# Patient Record
Sex: Female | Born: 1970 | Race: White | Hispanic: No | Marital: Married | Smoking: Never smoker
Health system: Southern US, Community
[De-identification: ages and names within clinical notes are randomized; demographics above are authoritative.]

## PROBLEM LIST (undated history)

## (undated) DIAGNOSIS — R0602 Shortness of breath: Secondary | ICD-10-CM

## (undated) DIAGNOSIS — D6959 Other secondary thrombocytopenia: Secondary | ICD-10-CM

## (undated) DIAGNOSIS — R5383 Other fatigue: Secondary | ICD-10-CM

## (undated) DIAGNOSIS — C50919 Malignant neoplasm of unspecified site of unspecified female breast: Secondary | ICD-10-CM

## (undated) DIAGNOSIS — T451X5A Adverse effect of antineoplastic and immunosuppressive drugs, initial encounter: Secondary | ICD-10-CM

## (undated) DIAGNOSIS — J95811 Postprocedural pneumothorax: Secondary | ICD-10-CM

## (undated) DIAGNOSIS — Z1501 Genetic susceptibility to malignant neoplasm of breast: Secondary | ICD-10-CM

## (undated) DIAGNOSIS — Z9289 Personal history of other medical treatment: Secondary | ICD-10-CM

## (undated) DIAGNOSIS — Z1509 Genetic susceptibility to other malignant neoplasm: Secondary | ICD-10-CM

## (undated) DIAGNOSIS — Z95828 Presence of other vascular implants and grafts: Secondary | ICD-10-CM

## (undated) DIAGNOSIS — K219 Gastro-esophageal reflux disease without esophagitis: Secondary | ICD-10-CM

## (undated) HISTORY — DX: Gastro-esophageal reflux disease without esophagitis: K21.9

## (undated) HISTORY — DX: Other fatigue: R53.83

## (undated) HISTORY — DX: Other secondary thrombocytopenia: D69.59

## (undated) HISTORY — DX: Genetic susceptibility to malignant neoplasm of breast: Z15.01

## (undated) HISTORY — PX: OTHER SURGICAL HISTORY: SHX169

## (undated) HISTORY — DX: Adverse effect of antineoplastic and immunosuppressive drugs, initial encounter: T45.1X5A

## (undated) HISTORY — DX: Malignant neoplasm of unspecified site of unspecified female breast: C50.919

## (undated) HISTORY — DX: Genetic susceptibility to other malignant neoplasm: Z15.09

---

## 1998-03-04 ENCOUNTER — Other Ambulatory Visit: Admission: RE | Admit: 1998-03-04 | Discharge: 1998-03-04 | Payer: Self-pay | Admitting: Gynecology

## 1999-07-24 ENCOUNTER — Inpatient Hospital Stay (HOSPITAL_COMMUNITY): Admission: AD | Admit: 1999-07-24 | Discharge: 1999-07-26 | Payer: Self-pay | Admitting: Obstetrics and Gynecology

## 1999-07-29 ENCOUNTER — Encounter (HOSPITAL_COMMUNITY): Admission: RE | Admit: 1999-07-29 | Discharge: 1999-08-16 | Payer: Self-pay | Admitting: Obstetrics and Gynecology

## 1999-08-23 ENCOUNTER — Other Ambulatory Visit: Admission: RE | Admit: 1999-08-23 | Discharge: 1999-08-23 | Payer: Self-pay | Admitting: Obstetrics and Gynecology

## 1999-10-20 ENCOUNTER — Encounter: Admission: RE | Admit: 1999-10-20 | Discharge: 2000-01-18 | Payer: Self-pay | Admitting: Obstetrics and Gynecology

## 2000-04-17 HISTORY — PX: DILATION AND CURETTAGE OF UTERUS: SHX78

## 2000-08-22 ENCOUNTER — Other Ambulatory Visit: Admission: RE | Admit: 2000-08-22 | Discharge: 2000-08-22 | Payer: Self-pay | Admitting: Obstetrics and Gynecology

## 2000-12-26 ENCOUNTER — Ambulatory Visit (HOSPITAL_COMMUNITY): Admission: AD | Admit: 2000-12-26 | Discharge: 2000-12-26 | Payer: Self-pay | Admitting: Obstetrics and Gynecology

## 2000-12-26 ENCOUNTER — Encounter (INDEPENDENT_AMBULATORY_CARE_PROVIDER_SITE_OTHER): Payer: Self-pay | Admitting: *Deleted

## 2002-01-03 ENCOUNTER — Inpatient Hospital Stay (HOSPITAL_COMMUNITY): Admission: AD | Admit: 2002-01-03 | Discharge: 2002-01-06 | Payer: Self-pay | Admitting: Obstetrics & Gynecology

## 2002-01-31 ENCOUNTER — Other Ambulatory Visit: Admission: RE | Admit: 2002-01-31 | Discharge: 2002-01-31 | Payer: Self-pay | Admitting: Obstetrics and Gynecology

## 2002-02-14 ENCOUNTER — Ambulatory Visit (HOSPITAL_COMMUNITY): Admission: RE | Admit: 2002-02-14 | Discharge: 2002-02-14 | Payer: Self-pay | Admitting: Obstetrics and Gynecology

## 2002-07-23 ENCOUNTER — Other Ambulatory Visit: Admission: RE | Admit: 2002-07-23 | Discharge: 2002-07-23 | Payer: Self-pay | Admitting: Obstetrics and Gynecology

## 2003-02-02 ENCOUNTER — Other Ambulatory Visit: Admission: RE | Admit: 2003-02-02 | Discharge: 2003-02-02 | Payer: Self-pay | Admitting: Obstetrics and Gynecology

## 2006-08-07 ENCOUNTER — Inpatient Hospital Stay (HOSPITAL_COMMUNITY): Admission: RE | Admit: 2006-08-07 | Discharge: 2006-08-09 | Payer: Self-pay | Admitting: Obstetrics & Gynecology

## 2009-05-26 ENCOUNTER — Encounter: Admission: RE | Admit: 2009-05-26 | Discharge: 2009-05-26 | Payer: Self-pay | Admitting: Obstetrics and Gynecology

## 2010-06-30 ENCOUNTER — Other Ambulatory Visit: Payer: Self-pay | Admitting: Obstetrics and Gynecology

## 2010-06-30 DIAGNOSIS — R928 Other abnormal and inconclusive findings on diagnostic imaging of breast: Secondary | ICD-10-CM

## 2010-07-06 ENCOUNTER — Ambulatory Visit
Admission: RE | Admit: 2010-07-06 | Discharge: 2010-07-06 | Disposition: A | Discharge status: Home / Self Care | Payer: 59 | Source: Ambulatory Visit | Attending: Obstetrics and Gynecology | Admitting: Obstetrics and Gynecology

## 2010-07-06 DIAGNOSIS — R928 Other abnormal and inconclusive findings on diagnostic imaging of breast: Secondary | ICD-10-CM

## 2010-09-02 NOTE — Op Note (Signed)
[facility]  Patient:    Stacey Cross, Stacey Cross Visit Number: [id number] MRN: [id number]          Service Type: OBS Location: [facility] Attending Physician:  Miguel Aschoff Dictated by:   Miguel Aschoff, M.D. Proc. Date: 12/26/00 Admit Date:  12/26/2000                             Operative Report  PREOPERATIVE DIAGNOSES:       Incomplete abortion.  POSTOPERATIVE DIAGNOSES:      Incomplete abortion.  PROCEDURE:                    Suction curettage.  SURGEON:                      Miguel Aschoff, M.D.  ANESTHESIA:                   IV sedation with paracervical block.  COMPLICATIONS:                None.  JUSTIFICATION:                The patient is a 40 year old white female gravida 2, para 1-0-0-1 with last menstrual period of October 18, 2000.  The patient had an ultrasound done last week which revealed what appeared to be an anembrionic sac.  Serial hCG values were obtained and revealed a plateauing of the values.  Patient did elect to wait one additional week before making any decisions about evacuation of the uterus, however, presented with the onset of very heavy vaginal bleeding today with cervical os open and POCs presenting. She is now being taken to the operating room to undergo suction curettage to evacuate the uterus.  PROCEDURE:                    Patient was taken to the operating room, placed in the supine position.  IV sedation was administered without difficulty.  She was then placed in the dorsal lithotomy position and prepped and draped in the usual sterile fashion.  Cervix was noted to be open and readily passed the #9 vacuum curette.  Suction was applied and the contents of the uterus were evacuated without difficulty.  Following the curettage sharp curettage was carried out with only a small amount of additional tissue being obtained. Then a final pass was made with a suction curette and no other tissue was found.  The procedure was then  completed.  It should be noted that prior to starting the procedure 18 cc of 1% Xylocaine were injected in the cervix at the 12, 4, and 8 oclock positions.  Patient tolerated the procedure well. The estimated total blood loss was approximately 80 cc.  Patient is noted to be Rh positive.  Plan is for the patient to be discharged home.  DISCHARGE MEDICATIONS:        1. Doxycycline 100 mg b.i.d. x 3 days.                               2. Darvocet-N 100 p.r.n. for cramping.  DISCHARGE INSTRUCTIONS:       She is directed to place nothing in the vagina for two weeks.  She will be seen back for followup in four weeks.  She is to call for any problems such as fever,  pain, or heavy bleeding. Dictated by:   Miguel Aschoff, M.D. Attending Physician:  Miguel Aschoff DD:  12/26/00 TD:  12/26/00 Job: [id number]

## 2010-09-02 NOTE — Op Note (Signed)
NAME:  Stacey Cross, Stacey Cross                        ACCOUNT NO.:  [id number]   MEDICAL RECORD NO.:  [id number]                   PATIENT TYPE:  INP   LOCATION:  9127                                 FACILITY:  [facility]   PHYSICIAN:  Randye Lobo, M.D.                DATE OF BIRTH:  06-04-1970   DATE OF PROCEDURE:  01/03/2002  DATE OF DISCHARGE:                                 OPERATIVE REPORT   PREOPERATIVE DIAGNOSES:  1. Intrauterine gestation at 37+1 weeks.  2. Breech presentation.  3. Early labor.   POSTOPERATIVE DIAGNOSES:  1. Intrauterine gestation at 37+1 weeks.  2. Breech presentation.  3. Early labor.   PROCEDURE:  Primary low segment transverse cesarean section.   SURGEON:  Randye Lobo, M.D.   ANESTHESIA:  Spinal.   IV FLUIDS:  3000 Ringer's lactate.   ESTIMATED BLOOD LOSS:  600 cc.   URINE OUTPUT:  250 cc clear yellow urine.   COMPLICATIONS:  None.   INDICATIONS FOR PROCEDURE:  The patient was a 40 year old gravida 3, para 1-  0-0-1 Caucasian female at 37+[redacted] weeks gestation ([facility] January 22, 2002 by last  menstrual period) who presented to the triage area at the [facility]  stating contractions q.10 minutes with onset late that afternoon.  The  patient reported also bloody red vaginal discharge, although she denied any  leakage of fluid per vagina.  The patient reported good fetal movement.  In  the office on January 02, 2002 the patient was noted to be 3 cm dilated  with 50% effacement and a breech presentation which was confirmed by  ultrasound.  After reviewing alternatives with the patient, she was  scheduled for an external cephalic version procedure on January 09, 2002.  Upon arrival to the [facility] on January 03, 2002 the patient's  cervix was noted to be 3+ cm dilated and 70% effaced with the breech at the  -2 station.  Membranes were intact and there was tension of the membranes  appreciated with a contraction.  A bed side ultrasound was  performed which  confirmed a complete breech presentation.  The fetal heart rate tracing was  noted to be reassuring and contractions were documented every three minutes.  The patient was diagnosed with early labor and a discussion was held with  her regarding her alternatives and a plan was made to proceed with a primary  low segment transverse cesarean section after the risks and benefits were  reviewed.   FINDINGS:  A complete breech presentation was confirmed.  The amniotic fluid  was noted to be clear.  A viable female was delivered at 1856 with Apgars of  8 at one minute and 9 at five minutes.  The weight was later noted to be 6  pounds and 8 ounces.  A cord pH measured 7.33.  The placenta had a normal  configuration and insertion of a three vessel  cord.  The uterus, tubes, and  ovaries were all normal.   SPECIMENS:  None.   PROCEDURE:  With an IV in place, the patient was taken from the triage area  directly down to the operating room suite after she received her IV and oral  Bicitra.  The patient did receive a spinal anesthetic and was then placed in  the left lateral decubitus position with a hip bolster underneath.  The  abdomen was sterilely prepped and a Foley catheter was sterilely placed  inside the bladder.  The procedure began with a Pfannenstiel incision which  was created sharply with a scalpel.  The incision was carried down to the  fascia using monopolar cautery.  Small bleeding vessels were cauterized  using the same.  The fascia was then scored in the midline with a scalpel  and the incision was carried up bilaterally using a Mayo scissors.  The  rectus muscles were dissected off of the overlying fascia using the Mayo  scissors.  The rectus muscles were then divided in the midline using a  combination of blunt and sharp dissection with Mayo scissors.  The  parietoperitoneum was identified and grasped with two clamp clamps.  It was  entered with a combination of  sharp dissection with the Metzenbaum scissors  and blunt dissection.  The incision was then extended cranially and caudally  with care taken to avoid enterotomy and cystotomy.   A bladder retractor was placed over the bladder and the position of the  fetus was confirmed.  A transverse lower uterine segment incision was  created sharply with a scalpel.  This incision was created high in the lower  uterine segment.  The uterine incision was extended with a combination of  blunt dissection and sharp dissection with a bandage scissors.  Membranes  were ruptured and a hand was inserted through the uterine incision and the  breech was delivered without difficulty followed by the right and the left  legs.  Each of the arms were delivered and then the head was delivered  without difficulty.  The nares and mouth were suctioned and the cord was  doubly clamped and cut.  The newborn was carried over to the waiting  pediatricians in a vigorous state.  The patient did receive Cefotetan 2 g  intravenously and cord blood and a cord pH were obtained at this time.  The  placenta was then manually extracted.  A moistened lap pad was used to wipe  clean the uterine cavity.  There were no remaining products of conception  and the cavity was noted to have a normal configuration.   The uterine incision was next closed with a running locked suture of number  1 chromic after the bladder had been dissected down from the lower uterine  segment.  There was some bleeding noted along the right apex of the incision  which responded well to a figure-of-eight suture of number 1 chromic.  Bleeding along the peritoneal edges of the vesicouterine fold then responded  well to monopolar cautery after it was noted to ooze slightly.   The uterus which had been exteriorized for its closure was returned to the peritoneal cavity which was irrigated and suctioned.  The incision was once  again reexamined and thought to be  hemostatic.   The abdomen was closed at this time, first using a running suture of number  3 plain along the peritoneum.  The rectus muscles were brought together in  the midline with  interrupted sutures of number 1 chromic.  The fascia was  closed with a running suture of 0 Vicryl and the subcutaneous tissue was  next irrigated and closed with interrupted sutures of 3-0 plain.  Staples  were placed over the skin followed by a sterile bandage.   The uterus was expressed of remaining clots and the Betadine was cleansed of  her skin.  The patient was then taken to the recovery room in stable and  awake condition.  There were no complications.  All needle, instrument, and  sponge counts were correct.                                               Randye Lobo, M.D.   BES/MEDQ  D:  01/03/2002  T:  01/06/2002  Job:  [id number]

## 2010-09-02 NOTE — Discharge Summary (Signed)
   NAME:  Stacey Cross, Stacey Cross                        ACCOUNT NO.:  [id number]   MEDICAL RECORD NO.:  [id number]                   PATIENT TYPE:  INP   LOCATION:  9127                                 FACILITY:  [facility]   PHYSICIAN:  Ilda Mori, M.D.                DATE OF BIRTH:  11/15/1970   DATE OF ADMISSION:  01/03/2002  DATE OF DISCHARGE:  01/06/2002                                 DISCHARGE SUMMARY   FINAL DIAGNOSES:  1. Intrauterine pregnancy at 13 and 1/[redacted] weeks gestation.  2. Breech presentation.  3. Early labor.   PROCEDURE:  Primary low segment transverse cesarean section.   SURGEON:  Randye Lobo, M.D.   COMPLICATIONS:  None.   HISTORY:  This 40 year old G3, P1-0-0-1 presents at 68 and 1/[redacted] weeks  gestation stating contractions every 10 minutes apart.  The patient also did  have a red vaginal discharge, although she denied any leakage of fluid.  There was good fetal movement.  Patient at the time of examination in triage  was noted to be 3 cm dilated, 50% effaced, and a breech presentation which  was confirmed by ultrasound.  After reviewing the alternatives and noting  that patient's contractions were every three minutes on the monitor, a  diagnosis of early labor and breech presentation was given and decision was  made to proceed with a primary low segment transverse cesarean section.  The  patient was taken to the operating room by Dr. Conley Simmonds where a primary  low transverse cesarean section was performed with the delivery of a 6 pound  8 ounce female infant with Apgars of 8 and 9.  Delivery went without  complications.  The patient's postoperative course was benign without  significant fevers.  The patient was felt ready for discharge on  postoperative day number three.  Was sent home on a regular diet.  Decrease  activity.  Told to continue prenatal vitamins.  Was given a prescription for  Tylox number 15 one q.4h. as needed for pain and told to follow up in the  office in four weeks.     Leilani Able, PA-C                   Ilda Mori, M.D.    MB/MEDQ  D:  02/10/2002  T:  02/11/2002  Job:  [id number]

## 2011-05-19 HISTORY — PX: BREAST BIOPSY: SHX20

## 2011-06-02 ENCOUNTER — Other Ambulatory Visit: Payer: Self-pay | Admitting: Obstetrics and Gynecology

## 2011-06-02 DIAGNOSIS — N63 Unspecified lump in unspecified breast: Secondary | ICD-10-CM

## 2011-06-05 ENCOUNTER — Ambulatory Visit
Admission: RE | Admit: 2011-06-05 | Discharge: 2011-06-05 | Disposition: A | Discharge status: Home / Self Care | Payer: BC Managed Care – PPO | Source: Ambulatory Visit | Attending: Obstetrics and Gynecology | Admitting: Obstetrics and Gynecology

## 2011-06-05 ENCOUNTER — Other Ambulatory Visit: Payer: Self-pay | Admitting: Obstetrics and Gynecology

## 2011-06-05 DIAGNOSIS — N63 Unspecified lump in unspecified breast: Secondary | ICD-10-CM

## 2011-06-05 DIAGNOSIS — N6320 Unspecified lump in the left breast, unspecified quadrant: Secondary | ICD-10-CM

## 2011-06-08 ENCOUNTER — Other Ambulatory Visit: Payer: 59

## 2011-06-09 ENCOUNTER — Other Ambulatory Visit: Payer: Self-pay | Admitting: Obstetrics and Gynecology

## 2011-06-09 ENCOUNTER — Ambulatory Visit
Admission: RE | Admit: 2011-06-09 | Discharge: 2011-06-09 | Disposition: A | Discharge status: Home / Self Care | Payer: BC Managed Care – PPO | Source: Ambulatory Visit | Attending: Obstetrics and Gynecology | Admitting: Obstetrics and Gynecology

## 2011-06-09 DIAGNOSIS — N6320 Unspecified lump in the left breast, unspecified quadrant: Secondary | ICD-10-CM

## 2011-06-12 ENCOUNTER — Ambulatory Visit
Admission: RE | Admit: 2011-06-12 | Discharge: 2011-06-12 | Disposition: A | Discharge status: Home / Self Care | Payer: BC Managed Care – PPO | Source: Ambulatory Visit | Attending: Obstetrics and Gynecology | Admitting: Obstetrics and Gynecology

## 2011-06-12 ENCOUNTER — Other Ambulatory Visit: Payer: Self-pay | Admitting: Obstetrics and Gynecology

## 2011-06-12 DIAGNOSIS — N6489 Other specified disorders of breast: Secondary | ICD-10-CM

## 2011-06-12 DIAGNOSIS — C50912 Malignant neoplasm of unspecified site of left female breast: Secondary | ICD-10-CM

## 2011-06-12 DIAGNOSIS — N6320 Unspecified lump in the left breast, unspecified quadrant: Secondary | ICD-10-CM

## 2011-06-13 ENCOUNTER — Other Ambulatory Visit: Payer: Self-pay | Admitting: *Deleted

## 2011-06-13 ENCOUNTER — Telehealth: Payer: Self-pay | Admitting: *Deleted

## 2011-06-13 DIAGNOSIS — C50419 Malignant neoplasm of upper-outer quadrant of unspecified female breast: Secondary | ICD-10-CM | POA: Insufficient documentation

## 2011-06-13 NOTE — Telephone Encounter (Signed)
Confirmed [facility] for 06/21/11 at 0800 .  Instructions and contact information given.

## 2011-06-14 ENCOUNTER — Ambulatory Visit
Admission: RE | Admit: 2011-06-14 | Discharge: 2011-06-14 | Disposition: A | Discharge status: Home / Self Care | Payer: BC Managed Care – PPO | Source: Ambulatory Visit | Attending: Obstetrics and Gynecology | Admitting: Obstetrics and Gynecology

## 2011-06-14 DIAGNOSIS — C50912 Malignant neoplasm of unspecified site of left female breast: Secondary | ICD-10-CM

## 2011-06-14 MED ORDER — GADOBENATE DIMEGLUMINE 529 MG/ML IV SOLN
13.0000 mL | Freq: Once | INTRAVENOUS | Status: AC | PRN
  Administered 2011-06-14: 13 mL via INTRAVENOUS

## 2011-06-16 ENCOUNTER — Other Ambulatory Visit: Payer: BC Managed Care – PPO

## 2011-06-16 DIAGNOSIS — J95811 Postprocedural pneumothorax: Secondary | ICD-10-CM

## 2011-06-16 DIAGNOSIS — R0602 Shortness of breath: Secondary | ICD-10-CM

## 2011-06-16 HISTORY — DX: Shortness of breath: R06.02

## 2011-06-16 HISTORY — DX: Postprocedural pneumothorax: J95.811

## 2011-06-20 ENCOUNTER — Other Ambulatory Visit: Payer: Self-pay | Admitting: Obstetrics and Gynecology

## 2011-06-21 ENCOUNTER — Ambulatory Visit (HOSPITAL_BASED_OUTPATIENT_CLINIC_OR_DEPARTMENT_OTHER): Payer: BC Managed Care – PPO | Admitting: Surgery

## 2011-06-21 ENCOUNTER — Encounter: Payer: Self-pay | Admitting: Oncology

## 2011-06-21 ENCOUNTER — Ambulatory Visit (HOSPITAL_BASED_OUTPATIENT_CLINIC_OR_DEPARTMENT_OTHER): Payer: BC Managed Care – PPO | Admitting: Lab

## 2011-06-21 ENCOUNTER — Ambulatory Visit: Payer: BC Managed Care – PPO | Admitting: Oncology

## 2011-06-21 ENCOUNTER — Encounter (INDEPENDENT_AMBULATORY_CARE_PROVIDER_SITE_OTHER): Payer: Self-pay | Admitting: Surgery

## 2011-06-21 ENCOUNTER — Ambulatory Visit: Payer: BC Managed Care – PPO

## 2011-06-21 ENCOUNTER — Ambulatory Visit: Payer: BC Managed Care – PPO | Attending: Surgery | Admitting: Physical Therapy

## 2011-06-21 ENCOUNTER — Encounter: Payer: Self-pay | Admitting: Specialist

## 2011-06-21 ENCOUNTER — Telehealth: Payer: Self-pay | Admitting: Oncology

## 2011-06-21 ENCOUNTER — Other Ambulatory Visit (HOSPITAL_BASED_OUTPATIENT_CLINIC_OR_DEPARTMENT_OTHER): Payer: BC Managed Care – PPO

## 2011-06-21 ENCOUNTER — Ambulatory Visit
Admission: RE | Admit: 2011-06-21 | Discharge: 2011-06-21 | Disposition: A | Discharge status: Home / Self Care | Payer: BC Managed Care – PPO | Source: Ambulatory Visit | Attending: Radiation Oncology | Admitting: Radiation Oncology

## 2011-06-21 VITALS — BP 154/98 | HR 71 | Temp 97.9°F | Wt 143.0 lb

## 2011-06-21 VITALS — BP 154/98 | HR 71 | Temp 97.9°F | Ht 65.5 in | Wt 143.2 lb

## 2011-06-21 DIAGNOSIS — C50419 Malignant neoplasm of upper-outer quadrant of unspecified female breast: Secondary | ICD-10-CM

## 2011-06-21 DIAGNOSIS — C50919 Malignant neoplasm of unspecified site of unspecified female breast: Secondary | ICD-10-CM | POA: Insufficient documentation

## 2011-06-21 DIAGNOSIS — M542 Cervicalgia: Secondary | ICD-10-CM | POA: Insufficient documentation

## 2011-06-21 DIAGNOSIS — IMO0001 Reserved for inherently not codable concepts without codable children: Secondary | ICD-10-CM | POA: Insufficient documentation

## 2011-06-21 LAB — CBC WITH DIFFERENTIAL/PLATELET
EOS%: 0.7 % (ref 0.0–7.0)
HGB: 15 g/dL (ref 11.6–15.9)
LYMPH%: 25.5 % (ref 14.0–49.7)
MCH: 33.7 pg (ref 25.1–34.0)
MCHC: 34.8 g/dL (ref 31.5–36.0)
MCV: 96.8 fL (ref 79.5–101.0)
RBC: 4.45 10*6/uL (ref 3.70–5.45)
RDW: 12.1 % (ref 11.2–14.5)
lymph#: 1.4 10*3/uL (ref 0.9–3.3)

## 2011-06-21 LAB — CANCER ANTIGEN 27.29: CA 27.29: 23 U/mL (ref 0–39)

## 2011-06-21 LAB — COMPREHENSIVE METABOLIC PANEL
AST: 20 U/L (ref 0–37)
Albumin: 4 g/dL (ref 3.5–5.2)
CO2: 26 mEq/L (ref 19–32)
Chloride: 101 mEq/L (ref 96–112)
Creatinine, Ser: 0.67 mg/dL (ref 0.50–1.10)
Potassium: 3.7 mEq/L (ref 3.5–5.3)
Total Bilirubin: 0.4 mg/dL (ref 0.3–1.2)

## 2011-06-21 NOTE — Telephone Encounter (Signed)
gv pt appt schedule for march thru June including pet/ct and echo.  Pt aware i will contact her re f/u appts for 3/21 and 4/4. No availability will check w/KK.

## 2011-06-21 NOTE — Progress Notes (Signed)
Dr. Welton Flakes requested a consultation for genetic counseling and risk assessment for Stacey Cross, a 41 y.o. female, for discussion of her personal history of breast cancer. She presents to clinic today to discuss the possibility of a genetic predisposition to cancer, and to further clarify her risks, as well as her family members' risks for cancer.   HISTORY OF PRESENT ILLNESS: In February, 2013, at the age of 74, Stacey Cross was diagnosed with invasive ductal carcinoma of the left breast. This will be treated with chemotherapy.    Past Medical History  Diagnosis Date  . Breast cancer     Past Surgical History  Procedure Date  . Cesarean section 2003  . Dilation and curettage of uterus 2002    History  Substance Use Topics  . Smoking status: Never Smoker   . Smokeless tobacco: Never Used  . Alcohol Use: 2.4 oz/week    4 Glasses of wine per week    REPRODUCTIVE HISTORY AND PERSONAL RISK ASSESSMENT FACTORS: Menarche was at age 1.   Premenopausal Uterus Intact: Yes  Ovaries Intact: Yes G3P3A0 , first live birth at age 34  She has not previously undergone treatment for infertility.   OCP Use: ~20 years   She has not used HRT in the past.   Triple negative breast cancer.  FAMILY HISTORY:  We obtained a detailed, 4-generation family history.  Significant diagnoses are listed below: Family History  Problem Relation Age of Onset  . Cancer Father   Stacey Cross was diagnosed with left IDC at age 69 years.  Her father died of a heart attack at age 44.  He had two sisters and three brothers.  One brother was diagnosed with prostate cancer and died in his 61s.  One paternal cousin was diagnosed with breast cancer at age 70.  Stacey Cross paternal grandmother died of "torso" cancer.  Stacey Cross maternal family history is reportedly negative for any type of cancer.  Patient's maternal and paternal ancestors are of [place] descent. There is no reported Ashkenazi Jewish ancestry.  There is no  known consanguinity.  GENETIC COUNSELING RISK ASSESSMENT, DISCUSSION, AND SUGGESTED FOLLOW UP: We reviewed the natural history and genetic etiology of sporadic, familial and hereditary cancer syndromes.  We discussed that approximately 5-10% of breast cancer is hereditary.  Of these, approximately 85% are a result of a mutation in the BRCA1 or BRCA2 genes.  [facility] performs testing on approximately 14 genes implicated in hereditary breast cancer.  We discussed the opportunity for applying for financial assistance for genetic testing through [email or web address].  The patient's personal history is suggestive of the following possible diagnosis: hereditary breast and ovarian cancer  We discussed that identification of a hereditary cancer syndrome may help her care providers tailor the patients medical management. If a BRCA1 or BRCA2 gene mutation is detected in this case, the [employer] recommendations would include bilateral mastectomies and oopherectomy. If a mutation is detected, the patient will be referred back to the referring provider and to any additional appropriate care providers to discuss the relevant options.   If a mutation is not found in the patient, this will decrease the likelihood of BRCA1 or BRCA2 mutations as the explanation for her breast cancer. At that time we will discuss the [facility] gene panel.  Cancer surveillance options would be discussed for the patient according to the appropriate standard National Comprehensive Cancer Network and American Cancer Society guidelines, with consideration of their personal and family  history risk factors. In this case, the patient will be referred back to their care providers for discussions of management.   After considering the risks, benefits, and limitations, the patient provided informed consent for  the following  testing:  BRCAnalysis through [employer].   Per the patient's request,  we will contact her by telephone to discuss these results. A follow up genetic counseling visit will be scheduled if indicated.  The patient was seen for a total of 30 minutes, greater than 50% of which was spent face-to-face counseling.  This plan is being carried out per Dr. Milta Deiters recommendations.  This note will also be sent to the referring provider via the electronic medical record. The patient will be supplied with a summary of this genetic counseling discussion as well as educational information on the discussed hereditary cancer syndromes following the conclusion of their visit.   Patient was discussed with Dr. Drue Second.  EPIC CC: Dr. Jamey Ripa Dr. Dayton Scrape   EDUCATIONAL INFORMATION SUPPLIED TO PATIENT AT ENCOUNTER:  Hereditary Breast and Ovarian Cancer Brochure, Application information for [email or web address],    _______________________________________________________________________ For Office Staff:  Number of people involved in session: 4 Was an Intern/ student involved with case: not applicable

## 2011-06-21 NOTE — Progress Notes (Signed)
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 female who is seen today at the [facility] for evaluation of her clinical stage IIA (T2, N0, M0) invasive ductal carcinoma of left breast. She recently noted a left breast mass, and Dr. Malva Limes scheduled her for mammography and ultrasonography on 06/05/2011. On physical examination she was felt to have a 3 cm mass in the upper outer quadrant of the left breast at 1:00 8-9 cm from the nipple. Ultrasound this measured 2.1 x 1.1 x 2.1 cm. There was no axillary adenopathy. A needle core biopsy on 08/07/2011 was diagnostic for invasive ductal carcinoma and DCIS. The tumor was triple negative with an elevated Ki-67 of 80%. Breast MRI on 06/14/2011 showed a 2.3 x 1.8 x 3.9 cm enhancing mass in the middle third of the breast at the 12:00 position. There was also an area representing blood products over 3.5 x 1.0 cm. There was no adenopathy appreciated. She made active raising 3 children, working full-time and running competitively.   PREVIOUS RADIATION THERAPY: No   PAST MEDICAL HISTORY:  has a past medical history of Breast cancer.     PAST SURGICAL HISTORY:  Past Surgical History  Procedure Date  . Cesarean section 2003  . Dilation and curettage of uterus 2002     FAMILY HISTORY: family history includes Cancer in her father. Her father died from cardiac disease at age 24.   SOCIAL HISTORY:  reports that she has never smoked. She has never used smokeless tobacco. She reports that she drinks about 2.4 ounces of alcohol per week. She reports that she does not use illicit  drugs. Married, 3 children ages 38, 74, 42. 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   ALLERGIES: Review of patient's allergies indicates no known allergies.   MEDICATIONS:  Current Outpatient Prescriptions  Medication Sig Dispense Refill  . aspirin 81 MG tablet Take 81 mg by mouth daily.          REVIEW OF SYSTEMS:  Pertinent items are noted in HPI.    PHYSICAL EXAM: Alert and oriented 41 year old white female appearing her stated age.  Vital signs: BP 154/98, P. 71 and regular, RR 20, temperature 97.9  Head and neck examination: Grossly unremarkable. Nodes: Without palpable cervical, supraclavicular, or axillary lymphadenopathy. Heart: Regular rate and rhythm. Chest: Lungs clear. Back: Without spinal or CVA tenderness.: There is a 3 cm mass palpable at approximately 1 to 2:00 with her left breast with overlying ecchymosis. No other masses are appreciated. Right breast without masses or lesions. Abdomen: Without masses organomegaly. Extremities: Without edema.     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 She'll complete her staging workup with a PET scan. She'll have genetic counseling. She is felt to be a candidate for neoadjuvant chemotherapy, followed by breast conservation surgery/sentinel lymph node biopsy, followed by radiation therapy. If she is BRCA1/2 positive then we would recommend bilateral mastectomies. I discussed the potential acute and late toxicities of radiation therapy should she receive radiation therapy at a later  date. From a technical standpoint, she may be a candidate for deep inspiration/breath-hold" technology for her left-sided cancer to avoid cardiac irradiation.   PLAN: As discussed above. She will primarily be followed and further evaluated by Dr. Drue Second at this time.  I spent 40 minutes minutes face to face with the patient and more than 50% of that time was spent in counseling and/or coordination of care.

## 2011-06-21 NOTE — Patient Instructions (Addendum)
1. You will receive chemotherapy before your surgery.  2. Echocardiogram, PET scan, CT scan will be scheduled  3. Chemotherapy teaching class.   4. Your chemotherapy will be given IV so you will need a port a cath to be placed by Dr. Jamey Ripa  5. I will see you back on 3/21 to begin your first treatement.  6. Anti-nausea medications prescriptions will be sent to your pharmacy

## 2011-06-21 NOTE — Progress Notes (Signed)
I met the patient, her husband, and her in-laws at the multidisciplinary clinic.  I gave her information about support services at the [facility], specifically the [facility] Breast Cancer Support Group and [facility].  She requested a referral to [facility] and was very interested in attending the support group.  She has already made contact with a breast cancer survivor who attends the group.

## 2011-06-21 NOTE — Patient Instructions (Signed)
We will schedule placement of a portacath to be done as an outpatient

## 2011-06-21 NOTE — Progress Notes (Signed)
Patient ID: Stacey Cross, female   DOB: Dec 26, 1970, 41 y.o.   MRN: [id number]  Chief Complaint  Patient presents with  . Breast Cancer   Referred by: Levi Aland, MD, MD   HPI Stacey Cross is a 41 y.o. female.  She recently found a lump in the left breast upper outer quadrant. She has been worked up and this as an IDC, triple negative. She has been recommended to have neoadjuvant chemo and then hopefully a lumpectomy and SLN. She will need a port for chemo. Metastatic w/o being scheduled. HPI  Past Medical History  Diagnosis Date  . Breast cancer     Past Surgical History  Procedure Date  . Cesarean section 2003  . Dilation and curettage of uterus 2002    Family History  Problem Relation Age of Onset  . Cancer Father     Social History History  Substance Use Topics  . Smoking status: Never Smoker   . Smokeless tobacco: Never Used  . Alcohol Use: 2.4 oz/week    4 Glasses of wine per week    No Known Allergies  Current Outpatient Prescriptions  Medication Sig Dispense Refill  . aspirin 81 MG tablet Take 81 mg by mouth daily.        Review of Systems Review of Systems  Constitutional: Negative for fever, chills and unexpected weight change.  HENT: Negative for hearing loss, congestion, sore throat, trouble swallowing and voice change.   Eyes: Negative for visual disturbance.  Respiratory: Negative for cough and wheezing.   Cardiovascular: Negative for chest pain, palpitations and leg swelling.  Gastrointestinal: Negative for nausea, vomiting, abdominal pain, diarrhea, constipation, blood in stool, abdominal distention and anal bleeding.  Genitourinary: Negative for hematuria, vaginal bleeding and difficulty urinating.  Musculoskeletal: Negative for arthralgias.  Skin: Negative for rash and wound.  Neurological: Negative for seizures, syncope and headaches.  Hematological: Negative for adenopathy. Does not bruise/bleed easily.  Psychiatric/Behavioral:  Negative for confusion.    VS:  Wt Readings from Last 3 Encounters:  06/21/11 143 lb 3.2 oz (64.955 kg)   Temp Readings from Last 3 Encounters:  06/21/11 97.9 F (36.6 C)    BP Readings from Last 3 Encounters:  06/21/11 154/98   Pulse Readings from Last 3 Encounters:  06/21/11 71     Physical Exam Physical Exam  Vitals reviewed. Constitutional: She is oriented to person, place, and time. She appears well-developed and well-nourished. No distress.  HENT:  Head: Normocephalic and atraumatic.  Mouth/Throat: Oropharynx is clear and moist.  Eyes: Conjunctivae and EOM are normal. Pupils are equal, round, and reactive to light. No scleral icterus.  Neck: Normal range of motion. Neck supple. No tracheal deviation present. No thyromegaly present.  Cardiovascular: Normal rate, regular rhythm, normal heart sounds and intact distal pulses.  Exam reveals no gallop and no friction rub.   No murmur heard. Pulmonary/Chest: Effort normal and breath sounds normal. No respiratory distress. She has no wheezes. She has no rales.         3.5 cm mass as drawn  Abdominal: Soft. Bowel sounds are normal. She exhibits no distension and no mass. There is no tenderness. There is no rebound and no guarding.  Musculoskeletal: Normal range of motion. She exhibits no edema and no tenderness.  Neurological: She is alert and oriented to person, place, and time.  Skin: Skin is warm and dry. No rash noted. She is not diaphoretic. No erythema.  Psychiatric: She has a normal mood and  affect. Her behavior is normal. Judgment and thought content normal.    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  We discussed the multidisciplinary approach to breast cancer which often includes both medical and radiation oncology consultations.  We also discussed surgical options for the treatment of breast cancer including lumpectomy and mastectomy with possible reconstructive surgery. In addition we talked about the evaluation and management of lymph nodes including a description of sentinel lymph node biopsy and axillary dissections. We reviewed potential complications and risks including bleeding, infection, numbness,  lymphedema, and the potential need for additional surgery.  She understands that for patients who are candidate for lumpectomy or mastectomy there is an equal survival rate with either technique, but a slightly higher local recurrence rate with lumpectomy. In addition she knows that a lumpectomy usually requires postoperative radiation as part of the management of the breast cancer.  We have discussed the likely postoperative course and plans for followup.  I have given the patient some written information that reviewed all of these issues. I believe her questions are answered and that she has a good understanding of the issues.  We will schedule port placement and I have reviewed risks/complications for that. She will see me mid chemo and then before her last treatment       Sevana Grandinetti J 06/21/2011, 11:18 AM   CC: Levi Aland, MD, MD

## 2011-06-22 ENCOUNTER — Encounter: Payer: BC Managed Care – PPO | Admitting: Genetic Counselor

## 2011-06-22 ENCOUNTER — Encounter (HOSPITAL_BASED_OUTPATIENT_CLINIC_OR_DEPARTMENT_OTHER): Payer: Self-pay | Admitting: *Deleted

## 2011-06-22 NOTE — Progress Notes (Signed)
No labs needed-had labs cc 06/21/11 Food 12 hr preop-clear lig 8 hr preop

## 2011-06-23 ENCOUNTER — Encounter (HOSPITAL_BASED_OUTPATIENT_CLINIC_OR_DEPARTMENT_OTHER): Payer: Self-pay | Admitting: Anesthesiology

## 2011-06-23 ENCOUNTER — Encounter (HOSPITAL_BASED_OUTPATIENT_CLINIC_OR_DEPARTMENT_OTHER)
Admission: RE | Disposition: A | Discharge status: Home / Self Care | Payer: Self-pay | Source: Ambulatory Visit | Attending: Surgery

## 2011-06-23 ENCOUNTER — Ambulatory Visit (HOSPITAL_BASED_OUTPATIENT_CLINIC_OR_DEPARTMENT_OTHER): Payer: BC Managed Care – PPO | Admitting: Anesthesiology

## 2011-06-23 ENCOUNTER — Ambulatory Visit (HOSPITAL_COMMUNITY): Payer: BC Managed Care – PPO

## 2011-06-23 ENCOUNTER — Ambulatory Visit (HOSPITAL_BASED_OUTPATIENT_CLINIC_OR_DEPARTMENT_OTHER)
Admission: RE | Admit: 2011-06-23 | Discharge: 2011-06-23 | Disposition: A | Discharge status: Home / Self Care | Payer: BC Managed Care – PPO | Source: Ambulatory Visit | Attending: Surgery | Admitting: Surgery

## 2011-06-23 DIAGNOSIS — C50419 Malignant neoplasm of upper-outer quadrant of unspecified female breast: Secondary | ICD-10-CM | POA: Insufficient documentation

## 2011-06-23 DIAGNOSIS — C50919 Malignant neoplasm of unspecified site of unspecified female breast: Secondary | ICD-10-CM

## 2011-06-23 HISTORY — PX: PORTACATH PLACEMENT: SHX2246

## 2011-06-23 SURGERY — INSERTION, TUNNELED CENTRAL VENOUS DEVICE, WITH PORT
Anesthesia: General | Site: Chest | Wound class: Clean

## 2011-06-23 MED ORDER — CEFAZOLIN SODIUM 1-5 GM-% IV SOLN
1.0000 g | INTRAVENOUS | Status: AC
  Administered 2011-06-23: 1 g via INTRAVENOUS

## 2011-06-23 MED ORDER — METOCLOPRAMIDE HCL 5 MG/ML IJ SOLN: 10.0000 mg | Freq: Once | INTRAMUSCULAR | Status: DC | PRN

## 2011-06-23 MED ORDER — ONDANSETRON HCL 4 MG/2ML IJ SOLN
INTRAMUSCULAR | Status: DC | PRN
  Administered 2011-06-23: 4 mg via INTRAVENOUS

## 2011-06-23 MED ORDER — CHLORHEXIDINE GLUCONATE 4 % EX LIQD: 1.0000 "application " | Freq: Once | CUTANEOUS | Status: DC

## 2011-06-23 MED ORDER — HYDROCODONE-ACETAMINOPHEN 5-325 MG PO TABS: 1.0000 | ORAL_TABLET | ORAL | Status: AC | PRN

## 2011-06-23 MED ORDER — LIDOCAINE HCL (CARDIAC) 20 MG/ML IV SOLN
INTRAVENOUS | Status: DC | PRN
  Administered 2011-06-23: 50 mg via INTRAVENOUS

## 2011-06-23 MED ORDER — FENTANYL CITRATE 0.05 MG/ML IJ SOLN: 50.0000 ug | INTRAMUSCULAR | Status: DC | PRN

## 2011-06-23 MED ORDER — FENTANYL CITRATE 0.05 MG/ML IJ SOLN
INTRAMUSCULAR | Status: DC | PRN
  Administered 2011-06-23 (×2): 25 ug via INTRAVENOUS
  Administered 2011-06-23: 75 ug via INTRAVENOUS
  Administered 2011-06-23: 25 ug via INTRAVENOUS

## 2011-06-23 MED ORDER — PROPOFOL 10 MG/ML IV EMUL
INTRAVENOUS | Status: DC | PRN
  Administered 2011-06-23: 200 mg via INTRAVENOUS

## 2011-06-23 MED ORDER — MIDAZOLAM HCL 2 MG/2ML IJ SOLN: 0.5000 mg | INTRAMUSCULAR | Status: DC | PRN

## 2011-06-23 MED ORDER — BUPIVACAINE HCL (PF) 0.25 % IJ SOLN
INTRAMUSCULAR | Status: DC | PRN
  Administered 2011-06-23: 10 mL

## 2011-06-23 MED ORDER — DROPERIDOL 2.5 MG/ML IJ SOLN
INTRAMUSCULAR | Status: DC | PRN
  Administered 2011-06-23: 0.625 mg via INTRAVENOUS

## 2011-06-23 MED ORDER — LACTATED RINGERS IV SOLN
INTRAVENOUS | Status: DC
  Administered 2011-06-23 (×3): via INTRAVENOUS

## 2011-06-23 MED ORDER — FENTANYL CITRATE 0.05 MG/ML IJ SOLN: 25.0000 ug | INTRAMUSCULAR | Status: DC | PRN

## 2011-06-23 MED ORDER — IOHEXOL 300 MG/ML  SOLN
INTRAMUSCULAR | Status: DC | PRN
  Administered 2011-06-23: 5 mL via INTRAVENOUS

## 2011-06-23 MED ORDER — HEPARIN SOD (PORK) LOCK FLUSH 100 UNIT/ML IV SOLN
INTRAVENOUS | Status: DC | PRN
  Administered 2011-06-23: 500 [IU] via INTRAVENOUS

## 2011-06-23 MED ORDER — HEPARIN (PORCINE) IN NACL 2-0.9 UNIT/ML-% IJ SOLN
INTRAMUSCULAR | Status: DC | PRN
  Administered 2011-06-23: 1 via INTRAVENOUS

## 2011-06-23 MED ORDER — DEXAMETHASONE SODIUM PHOSPHATE 4 MG/ML IJ SOLN
INTRAMUSCULAR | Status: DC | PRN
  Administered 2011-06-23: 10 mg via INTRAVENOUS

## 2011-06-23 SURGICAL SUPPLY — 47 items
BAG DECANTER FOR FLEXI CONT (MISCELLANEOUS) ×2 IMPLANT
BENZOIN TINCTURE PRP APPL 2/3 (GAUZE/BANDAGES/DRESSINGS) IMPLANT
BLADE HEX COATED 2.75 (ELECTRODE) ×2 IMPLANT
BLADE SURG 15 STRL LF DISP TIS (BLADE) ×1 IMPLANT
BLADE SURG 15 STRL SS (BLADE) ×1
CANISTER SUCTION 1200CC (MISCELLANEOUS) IMPLANT
CHLORAPREP W/TINT 26ML (MISCELLANEOUS) ×2 IMPLANT
CLOTH BEACON ORANGE TIMEOUT ST (SAFETY) ×2 IMPLANT
COVER MAYO STAND STRL (DRAPES) ×2 IMPLANT
COVER TABLE BACK 60X90 (DRAPES) ×2 IMPLANT
DECANTER SPIKE VIAL GLASS SM (MISCELLANEOUS) IMPLANT
DERMABOND ADVANCED (GAUZE/BANDAGES/DRESSINGS) ×1
DERMABOND ADVANCED .7 DNX12 (GAUZE/BANDAGES/DRESSINGS) ×1 IMPLANT
DRAPE C-ARM 42X72 X-RAY (DRAPES) ×2 IMPLANT
DRAPE LAPAROTOMY TRNSV 102X78 (DRAPE) ×2 IMPLANT
DRAPE UTILITY XL STRL (DRAPES) ×2 IMPLANT
ELECT REM PT RETURN 9FT ADLT (ELECTROSURGICAL) ×2
ELECTRODE REM PT RTRN 9FT ADLT (ELECTROSURGICAL) ×1 IMPLANT
GLOVE ECLIPSE 6.5 STRL STRAW (GLOVE) ×2 IMPLANT
GLOVE EUDERMIC 7 POWDERFREE (GLOVE) ×4 IMPLANT
GOWN PREVENTION PLUS XLARGE (GOWN DISPOSABLE) ×6 IMPLANT
IV CATH PLACEMENT UNIT 16 GA (IV SOLUTION) IMPLANT
IV HEPARIN 1000UNITS/500ML (IV SOLUTION) IMPLANT
IV KIT MINILOC 20X1 SAFETY (NEEDLE) IMPLANT
KIT BARDPORT ISP (Port) IMPLANT
KIT PORT POWER ISP 8FR (Catheter) IMPLANT
KIT POWER CATH 8FR (Catheter) IMPLANT
KIT POWER PORT SLIM 6FR (PORTABLE EQUIPMENT SUPPLIES) ×2 IMPLANT
NEEDLE HYPO 25X1 1.5 SAFETY (NEEDLE) ×2 IMPLANT
PACK BASIN DAY SURGERY FS (CUSTOM PROCEDURE TRAY) ×2 IMPLANT
PENCIL BUTTON HOLSTER BLD 10FT (ELECTRODE) ×2 IMPLANT
SET SHEATH INTRODUCER 10FR (MISCELLANEOUS) IMPLANT
SHEATH COOK PEEL AWAY SET 9F (SHEATH) IMPLANT
SHEET MEDIUM DRAPE 40X70 STRL (DRAPES) IMPLANT
SLEEVE SCD COMPRESS KNEE MED (MISCELLANEOUS) ×2 IMPLANT
STAPLER VISISTAT (STAPLE) IMPLANT
STRIP CLOSURE SKIN 1/2X4 (GAUZE/BANDAGES/DRESSINGS) IMPLANT
SUT MNCRL AB 4-0 PS2 18 (SUTURE) ×2 IMPLANT
SUT PROLENE 2 0 SH DA (SUTURE) ×2 IMPLANT
SUT VICRYL 3-0 CR8 SH (SUTURE) ×2 IMPLANT
SYR 5ML LUER SLIP (SYRINGE) ×2 IMPLANT
SYR CONTROL 10ML LL (SYRINGE) ×2 IMPLANT
TOWEL OR 17X24 6PK STRL BLUE (TOWEL DISPOSABLE) ×2 IMPLANT
TOWEL OR NON WOVEN STRL DISP B (DISPOSABLE) ×2 IMPLANT
TUBE CONNECTING 20X1/4 (TUBING) IMPLANT
WATER STERILE IRR 1000ML POUR (IV SOLUTION) IMPLANT
YANKAUER SUCT BULB TIP NO VENT (SUCTIONS) IMPLANT

## 2011-06-23 NOTE — Discharge Instructions (Signed)
[facility]  [address] [place], [place] [address] [phone]   Post Anesthesia Home Care Instructions  Activity: Get plenty of rest for the remainder of the day. A responsible adult should stay with you for 24 hours following the procedure.  For the next 24 hours, DO NOT: -Drive a car -Advertising copywriter -Drink alcoholic beverages -Take any medication unless instructed by your physician -Make any legal decisions or sign important papers.  Meals: Start with liquid foods such as gelatin or soup. Progress to regular foods as tolerated. Avoid greasy, spicy, heavy foods. If nausea and/or vomiting occur, drink only clear liquids until the nausea and/or vomiting subsides. Call your physician if vomiting continues.  Special Instructions/Symptoms: Your throat may feel dry or sore from the anesthesia or the breathing tube placed in your throat during surgery. If this causes discomfort, gargle with warm salt water. The discomfort should disappear within 24 hours.  Implanted Port Instructions An implanted port is a central line that has a round shape and is placed under the skin. It is used for long-term IV (intravenous) access for:  Medicine.   Fluids.   Liquid nutrition, such as TPN (total parenteral nutrition).   Blood samples.  Ports can be placed:  In the chest area just below the collarbone (this is the most common place.)   In the arms.   In the belly (abdomen) area.   In the legs.  PARTS OF THE PORT A port has 2 main parts:  The reservoir. The reservoir is round, disc-shaped, and will be a small, raised area under your skin.   The reservoir is the part where a needle is inserted (accessed) to either give medicines or to draw blood.   The catheter. The catheter is a long, slender tube that extends from the reservoir. The catheter is placed into a large vein.   Medicine that is inserted into the reservoir goes into the catheter and then into the  vein.  INSERTION OF THE PORT  The port is surgically placed in either an operating room or in a procedural area (interventional radiology).   Medicine may be given to help you relax during the procedure.   The skin where the port will be inserted is numbed (local anesthetic).   1 or 2 small cuts (incisions) will be made in the skin to insert the port.   The port can be used after it has been inserted.  INCISION SITE CARE  The incision site may have small adhesive strips on it. This helps keep the incision site closed. Sometimes, no adhesive strips are placed. Instead of adhesive strips, a special kind of surgical glue is used to keep the incision closed.   If adhesive strips were placed on the incision sites, do not take them off. They will fall off on their own.   The incision site may be sore for 1 to 2 days. Pain medicine can help.   Do not get the incision site wet. Bathe or shower as directed by your caregiver.   The incision site should heal in 5 to 7 days. A small scar may form after the incision has healed.  ACCESSING THE PORT Special steps must be taken to access the port:  Before the port is accessed, a numbing cream can be placed on the skin. This helps numb the skin over the port site.   A sterile technique is used to access the port.   The port is accessed with a  needle. Only "non-coring" port needles should be used to access the port. Once the port is accessed, a blood return should be checked. This helps ensure the port is in the vein and is not clogged (clotted).   If your caregiver believes your port should remain accessed, a clear (transparent) bandage will be placed over the needle site. The bandage and needle will need to be changed every week or as directed by your caregiver.   Keep the bandage covering the needle clean and dry. Do not get it wet. Follow your caregiver's instructions on how to take a shower or bath when the port is accessed.   If your port  does not need to stay accessed, no bandage is needed over the port.  FLUSHING THE PORT Flushing the port keeps it from getting clogged. How often the port is flushed depends on:  If a constant infusion is running. If a constant infusion is running, the port may not need to be flushed.   If intermittent medicines are given.   If the port is not being used.  For intermittent medicines:  The port will need to be flushed:   After medicines have been given.   After blood has been drawn.   As part of routine maintenance.   A port is normally flushed with:   Normal saline.   Heparin.   Follow your caregiver's advice on how often, how much, and the type of flush to use on your port.  IMPORTANT PORT INFORMATION  Tell your caregiver if you are allergic to heparin.   After your port is placed, you will get a manufacturer's information card. The card has information about your port. Keep this card with you at all times.   There are many types of ports available. Know what kind of port you have.   In case of an emergency, it may be helpful to wear a medical alert bracelet. This can help alert health care workers that you have a port.   The port can stay in for as long as your caregiver believes it is necessary.   When it is time for the port to come out, surgery will be done to remove it. The surgery will be similar to how the port was put in.   If you are in the hospital or clinic:   Your port will be taken care of and flushed by a nurse.   If you are at home:   A home health care nurse may give medicines and take care of the port.   You or a family member can get special training and directions for giving medicine and taking care of the port at home.  SEEK IMMEDIATE MEDICAL CARE IF:   Your port does not flush or you are unable to get a blood return.   New drainage or pus is coming from the incision.   A bad smell is coming from the incision site.   You develop swelling  or increased redness at the incision site.   You develop increased swelling or pain at the port site.   You develop swelling or pain in the surrounding skin near the port.   You have an oral temperature above 102 F (38.9 C), not controlled by medicine.  MAKE SURE YOU:   Understand these instructions.   Will watch your condition.   Will get help right away if you are not doing well or get worse.  Document Released: 04/03/2005 Document Revised: 03/23/2011 Document Reviewed:  06/25/2008 ExitCare Patient Information 2012 [employer], [facility]   May shower in am . .

## 2011-06-23 NOTE — Anesthesia Postprocedure Evaluation (Signed)
Anesthesia Post Note  Patient: Stacey Cross  Procedure(s) Performed: Procedure(s) (LRB): INSERTION PORT-A-CATH (N/A)  Anesthesia type: General  Patient location: PACU  Post pain: Pain level controlled  Post assessment: Patient's Cardiovascular Status Stable  Last Vitals:  Filed Vitals:   06/23/11 1715  BP: 125/74  Pulse: 59  Temp:   Resp: 19    Post vital signs: Reviewed and stable  Level of consciousness: alert  Complications: No apparent anesthesia complications

## 2011-06-23 NOTE — Op Note (Signed)
Stacey Cross 1970-08-08 [id number] 06/21/2011  Preoperative diagnosis: PAC needed Breast cancer neoadjuvant chemotherapy  Postoperative diagnosis: Same  Procedure: Portacath Placement  Surgeon: [name], MD, FACS  Anesthesia: General  Clinical History and Indications: The patient is getting ready to begin chemotherapy for her cancer. She  needs a Port-A-Cath for venous access.  Description of Procedure: I have seen the patient in the holding area and confirmed the plans for the procedure as noted above. I reviewed the risks and complications again and the patient has no further questions.  The patient was then taken to the operating room. After satisfactory general anesthesia had been obtained the upper chest and lower neck were prepped and draped as a sterile field. The timeout was done.  The right subclavian vein was entered and the guidewire threaded into the superior vena cava right atrial area under fluoroscopic guidance. An incision was then made on the anterior chest wall and a subcutaneous pocket fashioned for the port reservoir.  The port tubing was then brought through a subcutaneous tunnel from the port site to the guidewire site. The dilator and peel-away sheath were then advanced over the guidewire while monitoring this with fluoroscopy. The guidewire and dilator were removed and the tubing threaded to approximately 22 cm. The peel-away sheath was then removed. The catheter aspirated and flushed easily. Using fluoroscopy the tip was backed out into the superior vena cava right atrial junction area which was 15cm from the skin. It aspirated and flushed easily. The reservoir was attached and the locking mechanism engaged. That aspirated and flushed easily.  The reservoir was secured to the fascia with 2 sutures of 2-0 Prolene. A final check with fluoroscopy was done to make sure we had no kinks and good positioning of the tip of the catheter. Everything appeared to be  okay. The catheter was aspirated, flushed with dilute heparin and then concentrated aqueous heparin.  The incision was then closed with interrupted 3-0 Vicryl, and 4-0 Monocryl subcuticular with Dermabond on the skin.  There were no operative complications. Estimated blood loss was minimal. All counts were correct. The patient tolerated the procedure well.  [name], MD, FACS 06/23/2011 4:20 PM

## 2011-06-23 NOTE — H&P (View-Only) (Signed)
Patient ID: Stacey Cross, female   DOB: 06/29/1970, 40 y.o.   MRN: [id number]  Chief Complaint  Patient presents with  . Breast Cancer   Referred by: ANDERSON,MARK E, MD, MD   HPI Stacey Cross is a 40 y.o. female.  She recently found a lump in the left breast upper outer quadrant. She has been worked up and this as an IDC, triple negative. She has been recommended to have neoadjuvant chemo and then hopefully a lumpectomy and SLN. She will need a port for chemo. Metastatic w/o being scheduled. HPI  Past Medical History  Diagnosis Date  . Breast cancer     Past Surgical History  Procedure Date  . Cesarean section 2003  . Dilation and curettage of uterus 2002    Family History  Problem Relation Age of Onset  . Cancer Father     Social History History  Substance Use Topics  . Smoking status: Never Smoker   . Smokeless tobacco: Never Used  . Alcohol Use: 2.4 oz/week    4 Glasses of wine per week    No Known Allergies  Current Outpatient Prescriptions  Medication Sig Dispense Refill  . aspirin 81 MG tablet Take 81 mg by mouth daily.        Review of Systems Review of Systems  Constitutional: Negative for fever, chills and unexpected weight change.  HENT: Negative for hearing loss, congestion, sore throat, trouble swallowing and voice change.   Eyes: Negative for visual disturbance.  Respiratory: Negative for cough and wheezing.   Cardiovascular: Negative for chest pain, palpitations and leg swelling.  Gastrointestinal: Negative for nausea, vomiting, abdominal pain, diarrhea, constipation, blood in stool, abdominal distention and anal bleeding.  Genitourinary: Negative for hematuria, vaginal bleeding and difficulty urinating.  Musculoskeletal: Negative for arthralgias.  Skin: Negative for rash and wound.  Neurological: Negative for seizures, syncope and headaches.  Hematological: Negative for adenopathy. Does not bruise/bleed easily.  Psychiatric/Behavioral:  Negative for confusion.    VS:  Wt Readings from Last 3 Encounters:  06/21/11 143 lb 3.2 oz (64.955 kg)   Temp Readings from Last 3 Encounters:  06/21/11 97.9 F (36.6 C)    BP Readings from Last 3 Encounters:  06/21/11 154/98   Pulse Readings from Last 3 Encounters:  06/21/11 71     Physical Exam Physical Exam  Vitals reviewed. Constitutional: She is oriented to person, place, and time. She appears well-developed and well-nourished. No distress.  HENT:  Head: Normocephalic and atraumatic.  Mouth/Throat: Oropharynx is clear and moist.  Eyes: Conjunctivae and EOM are normal. Pupils are equal, round, and reactive to light. No scleral icterus.  Neck: Normal range of motion. Neck supple. No tracheal deviation present. No thyromegaly present.  Cardiovascular: Normal rate, regular rhythm, normal heart sounds and intact distal pulses.  Exam reveals no gallop and no friction rub.   No murmur heard. Pulmonary/Chest: Effort normal and breath sounds normal. No respiratory distress. She has no wheezes. She has no rales.         3.5 cm mass as drawn  Abdominal: Soft. Bowel sounds are normal. She exhibits no distension and no mass. There is no tenderness. There is no rebound and no guarding.  Musculoskeletal: Normal range of motion. She exhibits no edema and no tenderness.  Neurological: She is alert and oriented to person, place, and time.  Skin: Skin is warm and dry. No rash noted. She is not diaphoretic. No erythema.  Psychiatric: She has a normal mood and   affect. Her behavior is normal. Judgment and thought content normal.    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  We discussed the multidisciplinary approach to breast cancer which often includes both medical and radiation oncology consultations.  We also discussed surgical options for the treatment of breast cancer including lumpectomy and mastectomy with possible reconstructive surgery. In addition we talked about the evaluation and management of lymph nodes including a description of sentinel lymph node biopsy and axillary dissections. We reviewed potential complications and risks including bleeding, infection, numbness,  lymphedema, and the potential need for additional surgery.  She understands that for patients who are candidate for lumpectomy or mastectomy there is an equal survival rate with either technique, but a slightly higher local recurrence rate with lumpectomy. In addition she knows that a lumpectomy usually requires postoperative radiation as part of the management of the breast cancer.  We have discussed the likely postoperative course and plans for followup.  I have given the patient some written information that reviewed all of these issues. I believe her questions are answered and that she has a good understanding of the issues.  We will schedule port placement and I have reviewed risks/complications for that. She will see me mid chemo and then before her last treatment       Chanique Duca J 06/21/2011, 11:18 AM   CC: ANDERSON,MARK E, MD, MD   

## 2011-06-23 NOTE — Interval H&P Note (Signed)
History and Physical Interval Note:  06/23/2011 3:17 PM  Stacey Cross  has presented today for surgery, with the diagnosis of left breast cancer   The various methods of treatment have been discussed with the patient and family. After consideration of risks, benefits and other options for treatment, the patient has consented to  Procedure(s) (LRB): INSERTION PORT-A-CATH (N/A) as a surgical intervention .  The patients' history has been reviewed, patient examined, no change in status, stable for surgery.  I have reviewed the patients' chart and labs.  Questions were answered to the patient's satisfaction.     Cheree Fowles J

## 2011-06-23 NOTE — Anesthesia Preprocedure Evaluation (Signed)
Anesthesia Evaluation  Patient identified by MRN, date of birth, ID band Patient awake    Reviewed: Allergy & Precautions, H&P , NPO status , Patient's Chart, lab work & pertinent test results, reviewed documented beta blocker date and time   Airway Mallampati: II TM Distance: >3 FB Neck ROM: full    Dental   Pulmonary neg pulmonary ROS,          Cardiovascular negative cardio ROS      Neuro/Psych negative neurological ROS  negative psych ROS   GI/Hepatic negative GI ROS, Neg liver ROS,   Endo/Other  negative endocrine ROS  Renal/GU negative Renal ROS  negative genitourinary   Musculoskeletal   Abdominal   Peds  Hematology negative hematology ROS (+)   Anesthesia Other Findings See surgeon's H&P   Reproductive/Obstetrics negative OB ROS                           Anesthesia Physical Anesthesia Plan  ASA: I  Anesthesia Plan: General   Post-op Pain Management:    Induction: Intravenous  Airway Management Planned: LMA  Additional Equipment:   Intra-op Plan:   Post-operative Plan: Extubation in OR  Informed Consent: I have reviewed the patients History and Physical, chart, labs and discussed the procedure including the risks, benefits and alternatives for the proposed anesthesia with the patient or authorized representative who has indicated his/her understanding and acceptance.     Plan Discussed with: CRNA and Surgeon  Anesthesia Plan Comments:         Anesthesia Quick Evaluation  

## 2011-06-23 NOTE — Anesthesia Procedure Notes (Signed)
Procedure Name: LMA Insertion Date/Time: 06/23/2011 3:33 PM Performed by: Gar Gibbon Pre-anesthesia Checklist: Patient identified, Emergency Drugs available, Suction available and Patient being monitored Patient Re-evaluated:Patient Re-evaluated prior to inductionOxygen Delivery Method: Circle System Utilized Preoxygenation: Pre-oxygenation with 100% oxygen Intubation Type: IV induction Ventilation: Mask ventilation without difficulty LMA: LMA inserted LMA Size: 4.0 Number of attempts: 1 Placement Confirmation: positive ETCO2 Tube secured with: Tape Dental Injury: Teeth and Oropharynx as per pre-operative assessment

## 2011-06-23 NOTE — Transfer of Care (Signed)
Immediate Anesthesia Transfer of Care Note  Patient: Stacey Cross  Procedure(s) Performed: Procedure(s) (LRB): INSERTION PORT-A-CATH (N/A)  Patient Location: PACU  Anesthesia Type: General  Level of Consciousness: awake  Airway & Oxygen Therapy: Patient Spontanous Breathing and Patient connected to face mask oxygen  Post-op Assessment: Report given to PACU RN  Post vital signs: Reviewed and stable  Complications: No apparent anesthesia complications

## 2011-06-26 ENCOUNTER — Telehealth: Payer: Self-pay | Admitting: *Deleted

## 2011-06-26 ENCOUNTER — Other Ambulatory Visit: Payer: Self-pay | Admitting: Oncology

## 2011-06-26 ENCOUNTER — Other Ambulatory Visit: Payer: Self-pay | Admitting: *Deleted

## 2011-06-26 ENCOUNTER — Encounter: Payer: Self-pay | Admitting: *Deleted

## 2011-06-26 DIAGNOSIS — C50419 Malignant neoplasm of upper-outer quadrant of unspecified female breast: Secondary | ICD-10-CM

## 2011-06-26 MED ORDER — DEXAMETHASONE 4 MG PO TABS: ORAL_TABLET | ORAL | Status: DC

## 2011-06-26 MED ORDER — PROCHLORPERAZINE 25 MG RE SUPP: 25.0000 mg | Freq: Two times a day (BID) | RECTAL | Status: DC | PRN

## 2011-06-26 MED ORDER — PROCHLORPERAZINE MALEATE 10 MG PO TABS: 10.0000 mg | ORAL_TABLET | Freq: Four times a day (QID) | ORAL | Status: DC | PRN

## 2011-06-26 MED ORDER — LIDOCAINE-PRILOCAINE 2.5-2.5 % EX CREA: TOPICAL_CREAM | CUTANEOUS | Status: DC | PRN

## 2011-06-26 MED ORDER — LORAZEPAM 0.5 MG PO TABS: 0.5000 mg | ORAL_TABLET | Freq: Four times a day (QID) | ORAL | Status: DC | PRN

## 2011-06-26 MED ORDER — ONDANSETRON HCL 8 MG PO TABS: ORAL_TABLET | ORAL | Status: DC

## 2011-06-26 NOTE — Telephone Encounter (Signed)
Left vm for pt to return call regarding BMDC from 06/21/11.

## 2011-06-26 NOTE — Telephone Encounter (Signed)
per orders from 06-26-2011 placed patient's injection on the schedule patient will be here on 06-27-2011 will print out patient's calendar on that day

## 2011-06-26 NOTE — Telephone Encounter (Signed)
still waiting on doctor to give time for 07-20-2011 as of 06-26-2011

## 2011-06-26 NOTE — Telephone Encounter (Signed)
Gave pt new schedule for 3/21.  Confirmed lab/MD/chemo appt times and date.

## 2011-06-27 ENCOUNTER — Ambulatory Visit: Payer: BC Managed Care – PPO

## 2011-06-27 ENCOUNTER — Encounter: Payer: Self-pay | Admitting: *Deleted

## 2011-06-27 ENCOUNTER — Encounter (HOSPITAL_BASED_OUTPATIENT_CLINIC_OR_DEPARTMENT_OTHER): Payer: Self-pay | Admitting: Surgery

## 2011-06-27 ENCOUNTER — Telehealth: Payer: Self-pay | Admitting: *Deleted

## 2011-06-27 ENCOUNTER — Other Ambulatory Visit: Payer: BC Managed Care – PPO

## 2011-06-27 NOTE — Telephone Encounter (Signed)
cancelled appointment for injeciton appointment called patient and left message inform the patient of the cancelled appointemnt injection only cancelled patient called back and confirmed the cancellation

## 2011-06-27 NOTE — Progress Notes (Signed)
Stacey Cross [id number] 07-18-1970 41 y.o. 06/27/2011 3:41 PM  CC  Levi Aland, MD, MD [address] [place] [address] Dr. Chipper Herb Dr. 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 Cyndia Bent  HISTORY OF PRESENT ILLNESS:  Stacey Cross is a 41 y.o. female.  Without significant medical history. Patient began having screening mammograms at about 46. Most recently she felt a mass in the left breast. For which she went on to have an mammogram performed that showed a mass in the left breast. Ultrasound was performed that showed the mass to measure out at 2.1 x 1.1 x 2.1 cm there was no sonographic evidence of axillary lymph nodes. Patient went on to have a needle core biopsy performed and the pathology was positive for an invasive ductal carcinoma with DCIS ER negative PR negative HER-2/neu negative with a proliferation marker 80%. On 06/14/2011 patient had MRI of the breasts performed that showed 2.3 x 1.8 x 3.9 cm mass in the middle third of the breast at the 12:00 position. She now presents in the multidisciplinary breast clinic for discussion of treatment options.   Past Medical History: Past Medical History  Diagnosis Date  . Breast cancer     Past Surgical History: Past Surgical History  Procedure Date  . Cesarean section 2003  . Dilation and curettage of uterus 2002    Family History: Family History  Problem Relation Age of Onset  . Cancer Father     Social History History  Substance Use Topics  . Smoking status: Never Smoker   .  Smokeless tobacco: Never Used  . Alcohol Use: 2.4 oz/week    4 Glasses of wine per week    Allergies No Known Allergies  Current Medications: Current Outpatient Prescriptions  Medication Sig Dispense Refill  . aspirin 81 MG tablet Take 81 mg by mouth daily.      Marland Kitchen dexamethasone (DECADRON) 4 MG tablet Take 2 tablets by mouth once a day on the day after chemotherapy and then take 2 tablets two times a day for 2 days. Take with food.  30 tablet  1  . HYDROcodone-acetaminophen (NORCO) 5-325 MG per tablet Take 1 tablet by mouth every 4 (four) hours as needed for pain.  10 tablet  0  . lidocaine-prilocaine (EMLA) cream Apply topically as needed.  30 g  3  . LORazepam (ATIVAN) 0.5 MG tablet Take 1 tablet (0.5 mg total) by mouth every 6 (six) hours as needed (Nausea or vomiting).  30 tablet  0  . ondansetron (ZOFRAN) 8 MG tablet Take 1 tablet two times a day as needed for nausea or vomiting starting on the third day after chemotherapy.  30 tablet  1  . prochlorperazine (COMPAZINE) 10 MG tablet Take 1 tablet (10 mg total) by mouth every 6 (six) hours as needed (Nausea or vomiting).  30 tablet  1  . prochlorperazine (COMPAZINE) 25 MG suppository Place 1 suppository (25 mg total) rectally every 12 (twelve) hours as needed for nausea.  12 suppository  3  OB/GYN History:Menarche age 53 she is premenopausal last menstrual period was on 06/15/11. She has had 3 term pregnancies first live birth was at 35 her children are ages 81 9 and 81.  Fertility Discussion:Patient has completed her family Prior History of Cancer:No prior history of cancers  Health Maintenance:  ColonoscopyNone Bone DensityNone Last PAP smear03/08/2011 patient had her first mammogram in June 2005  ECOG PERFORMANCE STATUS: 0 - Asymptomatic  Genetic Counseling/testing:Patient's family history was extensively reviewed and she was seen by genetic counselor and genetic testing was recommended that she would fit the guide line  criteria for triple negative under 50. And her blood was sent for comprehensive BRCA one and 2 analysis. There is a paternal cousin who had breast cancer in her 2s paternal grandmother mother died from torso cancer at the age of 45 there is a history of prostate cancer in 2 of her uncles there is no history of ovarian cancer.  REVIEW OF SYSTEMS:  A comprehensive review of systems was negative.  PHYSICAL EXAMINATION: Blood pressure 154/98, pulse 71, temperature 97.9 F (36.6 C), height 5' 5.5" (1.664 m), weight 143 lb 3.2 oz (64.955 kg), last menstrual period 2011-06-15.  ZOX:WRUEA, healthy, no distress, well nourished, well developed and anxious SKIN: no rashes or significant lesions HEAD: Normocephalic, No masses, lesions, tenderness or abnormalities EYES: normal, PERRLA, EOMI, Conjunctiva are pink and non-injected, sclera clear EARS: External ears normal OROPHARYNX:no exudate, no erythema and lips, buccal mucosa, and tongue normal  NECK: no adenopathy, no bruits, no JVD, thyroid normal size, non-tender, without nodularity LYMPH:  no palpable lymphadenopathy, no hepatosplenomegaly, few small anterior cervical nodes BREAST:Patient has a palpable mass in the left breast measuring about 3.5 cm there is area of ecchymosis secondary to her recent biopsy LUNGS: clear to auscultation and percussion HEART: regular rate & rhythm, no murmurs, no gallops, S1 normal and S2 normal ABDOMEN:abdomen soft, non-tender, normal bowel sounds and no masses or organomegaly BACK: Back symmetric, no curvature., No CVA tenderness, Range of motion is normal EXTREMITIES:no edema, no clubbing, no cyanosis  NEURO: alert & oriented x 3 with fluent speech, no focal motor/sensory deficits, gait normal, reflexes normal and symmetric     STUDIES/RESULTS: US Breast Left  06-15-11  *RADIOLOGY REPORT*  Clinical Data:  Palpable mass upper central left breast.  DIGITAL DIAGNOSTIC LEFT MAMMOGRAM  WITH CAD AND LEFT BREAST  ULTRASOUND:  Comparison:  06/15/2010  Findings:  There is an irregular spiculated mass in the upper and slightly outer left breast, posterior third, corresponding to the palpable area of concern.  There are a few microcalcifications within the mass.  The remainder of the left breast is negative.  No evidence of skin thickening.  Mammographic images were processed with CAD.  On physical exam, there is an approximately 3 cm palpable mass in the upper slightly outer left breast approximately 1 o'clock position 8 to 9 cm from the nipple.  Ultrasound is performed, showing an irregular hypoechoic mass with marked posterior acoustic shadowing that measures approximately 2.1 x 1.1 x  2.1 cm on ultrasound. Color Doppler flow is seen within the mass.  Ultrasound of the left axilla is performed.  A normal axillary lymph node measuring 5 mm short axis within normal fatty hilum is seen.  No enlarged or otherwise suspicious axillary lymph nodes.  IMPRESSION: Approximately 2 cm palpable mass 1 o'clock position is highly suspicious for malignancy.  Ultrasound-guided core needle biopsy is recommended.  This was discussed with the patient, and she is  scheduled for biopsy on Friday June 09, 2011.  BI-RADS CATEGORY 5:  Highly suggestive of malignancy - appropriate action should be taken.  Original Report Authenticated By: Britta Mccreedy, M.D.   Mr Breast Bilateral W Wo Contrast  06/15/2011  *RADIOLOGY REPORT*  Clinical Data: Recent biopsy proven invasive and insight to ductal carcinoma left 12 o'clock.  BILATERAL BREAST MRI WITH AND WITHOUT CONTRAST  Technique: Multiplanar, multisequence MR images of both breasts were obtained prior to and following the intravenous administration of 13ml of Multihance.  Three dimensional images were evaluated at the independent DynaCad workstation.  Comparison:  None.  Findings: There is moderate symmetric bilateral background parenchymal enhancement.  The right breast is negative.  There is no  evidence of internal mammary or axillary adenopathy.  On the left, there is a marker clip at the middle third depth of the 12 o'clock position.  This corresponds to an irregular shaped enhancing mass with irregular borders, measuring 23 x 18(transverse) x 39 (craniocaudal) mm.  This mass demonstrates a mixture of enhancement kinetics including moderate to rapid enhancment with areas of washout.  Immediately lateral and superior to the biopsied mass are a cluster of post-procedure hematomas showing signal characterisitics consistent with evolving blood products, measuring in aggregate approximately 3.5 x 1.0cm.  There are no other abnormalities on the left.  IMPRESSION: Know invasive/in situ ductal carcinoma left 12:00 associated with marker clip and hematoma, with no other suspicious foci.  BI-RADS CATEGORY 6:  Known biopsy-proven malignancy - appropriate action should be taken.  THREE-DIMENSIONAL MR IMAGE RENDERING ON INDEPENDENT WORKSTATION:  Three-dimensional MR images were rendered by post-processing of the original MR data on an independent workstation.  The three- dimensional MR images were interpreted, and findings were reported in the accompanying complete MRI report for this study.  Original Report Authenticated By: [email or web address]   [place] Core Biopsy  06/09/2011  *RADIOLOGY REPORT*  Clinical Data:  Spiculated mass at 1 o'clock 9 cm from the left nipple.  ULTRASOUND GUIDED VACUUM ASSISTED CORE BIOPSY OF THE LEFT BREAST  The patient and I discussed the procedure of ultrasound-guided biopsy, including benefits and alternatives.  We discussed the high likelihood of a successful procedure. We discussed the risks of the procedure, including infection, bleeding, tissue injury, clip migration and inadequate sampling.  Informed written consent was given.  Using sterile technique, 2% lidocaine, ultrasound guidance and a 12 gauge vacuum assisted needle, biopsy was performed of the mass at 1 o'clock 9 cm from the left nipple.   At the conclusion of the procedure, a ribbon tissue marker clip was deployed into the biopsy cavity.  Follow-up 2-view mammogram was performed and dictated separately.  A small hematoma was noted.  IMPRESSION: Ultrasound-guided biopsy of a mass at 1 o'clock 9 cm from the left nipple. Small hematoma formation.  Original Report Authenticated By: Daryl Eastern, M.D.   Dg Chest Portable 1 View  06/23/2011  *RADIOLOGY REPORT*  Clinical Data: Status post Port-A-Cath placement.  PORTABLE CHEST - 1 VIEW  Comparison: No priors.  Findings: Right subclavian single lumen Port-A-Cath with tip terminating in the mid superior vena cava.  Lung volumes are normal.  No consolidative airspace disease.  No pleural effusions. Heart size is borderline enlarged.  Mediastinal contours are unremarkable.  No pneumothorax.  IMPRESSION: 1.  Right subclavian Port-A-Cath in position with tip terminating in the mid superior vena cava.  No pneumothorax or other immediate complicating features. 2.  No radiographic evidence of acute cardiopulmonary disease.  Original Report  Authenticated By: Florencia Reasons, M.D.   Mm Digital Diagnostic Unilat L  06/09/2011  *RADIOLOGY REPORT*  Clinical Data:  Ultrasound-guided core needle biopsy of the mass at 1 o'clock 9 cm from the right nipple with clip placement.  DIGITAL DIAGNOSTIC LEFT MAMMOGRAM  Comparison:  None.  Findings:  Films are performed following ultrasound guided biopsy of a spiculated mass at 1 o'clock 9 cm from the left nipple.  The InRad ribbon clip is appropriately positioned.  A small hematoma is present.  IMPRESSION: Appropriate clip placement following ultrasound-guided core needle biopsy of a mass at 1 o'clock 9 cm from the left nipple.  Original Report Authenticated By: Daryl Eastern, M.D.   Mm Digital Diagnostic Unilat L  06/05/2011  *RADIOLOGY REPORT*  Clinical Data:  Palpable mass upper central left breast.  DIGITAL DIAGNOSTIC LEFT MAMMOGRAM  WITH CAD AND LEFT  BREAST ULTRASOUND:  Comparison:  06/15/2010  Findings:  There is an irregular spiculated mass in the upper and slightly outer left breast, posterior third, corresponding to the palpable area of concern.  There are a few microcalcifications within the mass.  The remainder of the left breast is negative.  No evidence of skin thickening.  Mammographic images were processed with CAD.  On physical exam, there is an approximately 3 cm palpable mass in the upper slightly outer left breast approximately 1 o'clock position 8 to 9 cm from the nipple.  Ultrasound is performed, showing an irregular hypoechoic mass with marked posterior acoustic shadowing that measures approximately 2.1 x 1.1 x  2.1 cm on ultrasound. Color Doppler flow is seen within the mass.  Ultrasound of the left axilla is performed.  A normal axillary lymph node measuring 5 mm short axis within normal fatty hilum is seen.  No enlarged or otherwise suspicious axillary lymph nodes.  IMPRESSION: Approximately 2 cm palpable mass 1 o'clock position is highly suspicious for malignancy.  Ultrasound-guided core needle biopsy is recommended.  This was discussed with the patient, and she is scheduled for biopsy on Friday June 09, 2011.  BI-RADS CATEGORY 5:  Highly suggestive of malignancy - appropriate action should be taken.  Original Report Authenticated By: Britta Mccreedy, M.D.   Mm Digital Diagnostic Unilat R  06/12/2011  *RADIOLOGY REPORT*  Clinical Data:  New diagnosis left sided breast cancer.  Pre MRI screening, right breast.  DIGITAL DIAGNOSTIC RIGHT MAMMOGRAM WITH CAD  Comparison:  06/15/2010  Findings:  There is a fibroglandular pattern.  No mass or malignant type calcifications are seen.  Compared to priors. Mammographic images were processed with CAD.  IMPRESSION: No mammographic evidence of malignancy, right breast.  The patient is scheduled for a breast MRI on Friday, March 1.  BI-RADS CATEGORY 1:  Negative.  Original Report Authenticated By:  Hiram Gash, M.D.   Mm Radiologist Eval And Mgmt  06/12/2011  *RADIOLOGY REPORT*  ESTABLISHED PATIENT OFFICE VISIT - LEVEL II [phone])  Chief Complaint:  The patient returns for discussion of the pathologic report after ultrasound-guided core needle biopsy of the left breast performed on 06/09/2011.  History:  The patient felt a mass in the upper portion of the left breast.  Evaluation demonstrated a spiculated mass suspicious for carcinoma, at 12 o'clock 8-9 cm from the left nipple measuring approximately 2.1 x 1.1 x 2.1cm. Ultrasound-guided core needle biopsy was performed on 06/09/2011.  Exam:  On physical examination, there is mild ecchymosis at the biopsy site.  There is no sign of hematoma or infection.  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  This is concordant with the imaging findings.  Results were discussed with the patient and her husband.  Questions were answered and educational materials were given.  The patient is scheduled for breast MRI on 06/16/2011. She is scheduled to be seen in the [facility] on 06/21/2011.  Original Report Authenticated By: Daryl Eastern, M.D.   Dg Fluoro Guide Cv Line-no Report  06/23/2011  CLINICAL DATA: port placement   FLOURO GUIDE CV LINE  Fluoroscopy was utilized by the requesting physician.  No radiographic  interpretation.       LABS:    Chemistry      Component Value Date/Time   NA 137 06/21/2011 0806   K 3.7 06/21/2011 0806   CL 101 06/21/2011 0806   CO2 26 06/21/2011 0806   BUN 10 06/21/2011 0806   CREATININE 0.67 06/21/2011 0806      Component Value Date/Time   CALCIUM 9.3 06/21/2011 0806   ALKPHOS 53 06/21/2011 0806   AST 20 06/21/2011 0806   ALT 17 06/21/2011 0806   BILITOT 0.4 06/21/2011 0806      Lab Results  Component Value Date   WBC 5.6 06/21/2011   HGB 15.0 06/21/2011   HCT 43.1 06/21/2011   MCV 96.8 06/21/2011   PLT 208 06/21/2011        PATHOLOGY: ADDITIONAL INFORMATION: CHROMOGENIC IN-SITU HYBRIDIZATION Interpretation HER-2/NEU BY CISH - NO AMPLIFICATION OF HER-2 DETECTED. THE RATIO OF HER-2: CEP 17 SIGNALS WAS 1.11. Reference range: Ratio: HER2:CEP17 < 1.8 - gene amplification not observed Ratio: HER2:CEP 17 1.8-2.2 - equivocal result Ratio: HER2:CEP17 > 2.2 - gene amplification observed Pecola Leisure MD Pathologist, Electronic Signature ( Signed 06/14/2011) PROGNOSTIC INDICATORS - ACIS Results IMMUNOHISTOCHEMICAL AND MORPHOMETRIC ANALYSIS BY THE AUTOMATED CELLULAR IMAGING SYSTEM (ACIS) Estrogen Receptor (Negative, <1%): 0%, NEGATIVE Progesterone Receptor (Negative, <1%): 0%, NEGATIVE Proliferation Marker Ki67 by M IB-1 (Low<20%): 80% COMMENT: The negative hormone receptor study(ies) in this case have an internal positive control. All controls stained appropriately Pecola Leisure MD Pathologist, Electronic Signature ( Signed 06/14/2011) 1 of 2 FINAL for DEZARAY, SHIBUYA ([id number]) FINAL DIAGNOSIS Diagnosis Breast, left, needle core biopsy, mass, 1 o'clock, 9 cm from nipple - INVASIVE DUCTAL CARCINOMA. - DUCTAL CARCINOMA IN SITU. Microscopic Comment The core biopsies are involved by intermediate grade invasive ductal carcinoma and ductal carcinoma in situ. Breast prognostic profile will be performed and reported separately. Dr. Frederica Kuster agrees. Called to [facility] on 06/12/11. (JDP:eps 06/12/11) JOHN  ASSESSMENT    41 year old female with  #1 stage II invasive ductal carcinoma of the left breast found on self palpation of a mass in the left breast. She is status post needle core biopsy with the final pathology revealing a high-grade invasive ductal carcinoma that was ER negative PR negative HER-2/neu negative with a proliferation marker at 80%. Per to   #2several family members with history of breast cancer, prostate cancer but no ovarian cancer apparently.  Clinical Trial  Eligibility:none Multidisciplinary conference discussion Was discussed at the multidisciplinary breast day. Due to the size of the disease and nature of the disease neoadjuvant chemotherapy was recommended    PLAN:  1. Recommended neoadjuvant chemotherapy as she is very much interested in breast conservation. This will also give [place] a chance to evaluate response to our chemotherapy. Chemotherapy in this setting were discussed with the patient and her family in great detail.  #  2 I have recommended Adriamycin Cytoxan given dose dense with day 2 Neulasta for cycle. This would then be followed by taxotere/carboplatin q 2 weeks x 4 cycles  #3 we will after she completes her chemotherapy get Breasts to evaluate response to therapy.  #4 patient does fit the criteria for genetic counseling and testing and this was performed during her visit.  #5 patient will need a Port-A-Cath placed and Dr. Jamey Ripa will do so she will also need chemotherapy education class as well as anti-emetics and all of this have been sent to her pharmacy.  #6 I will plan on starting the patient on chemotherapy on 07/06/2011#7 risks and benefits of chemotherapy were very clearly explained to the patient and her family in great detail.    .       Discussion: Patient is being treated per NCCN breast cancer care guidelines appropriate for stage.II: guidelines recommend chemotherapy for triple negative breast cancer   Thank you so much for allowing me to participate in the care of Tationna M Ogburn. I will continue to follow up the patient with you and assist in her care.  All questions were answered. The patient knows to call the clinic with any problems, questions or concerns. We can certainly see the patient much sooner if necessary.  I spent 60 minutes counseling the patient face to face. The total time spent in the appointment was 60 minutes.  Drue Second, MD Medical/Oncology [facility] [phone]  (beeper) [phone] (Office)  06/27/2011, 3:41 PM 06/27/2011, 3:41 PM

## 2011-06-28 ENCOUNTER — Telehealth: Payer: Self-pay | Admitting: Oncology

## 2011-06-28 NOTE — Telephone Encounter (Signed)
Added appt for lb/fu on 4/4 and remaining inj appts. appt for 3/21 has also been added. Per message from KK to [facility] I moved a follow up appt from NR's schedule on 4/4 to add a follow up appt for Mrs. Baller. Pt contacted by [facility]. A message was left on both home/cell phones re add on appts and schedule being complete now. Pt to get new schedule when she comes in 3/21........................[place].

## 2011-06-28 NOTE — Telephone Encounter (Signed)
Pt returned my call confirming she did get my message and will get her schedule when she goes to [facility] 3/15 for echo.

## 2011-06-30 ENCOUNTER — Telehealth: Payer: Self-pay | Admitting: Genetic Counselor

## 2011-06-30 ENCOUNTER — Ambulatory Visit (HOSPITAL_COMMUNITY)
Admission: RE | Admit: 2011-06-30 | Discharge: 2011-06-30 | Disposition: A | Discharge status: Home / Self Care | Payer: BC Managed Care – PPO | Source: Ambulatory Visit | Attending: Oncology | Admitting: Oncology

## 2011-06-30 DIAGNOSIS — I079 Rheumatic tricuspid valve disease, unspecified: Secondary | ICD-10-CM | POA: Insufficient documentation

## 2011-06-30 DIAGNOSIS — C50419 Malignant neoplasm of upper-outer quadrant of unspecified female breast: Secondary | ICD-10-CM

## 2011-06-30 DIAGNOSIS — I369 Nonrheumatic tricuspid valve disorder, unspecified: Secondary | ICD-10-CM

## 2011-06-30 DIAGNOSIS — Z01818 Encounter for other preprocedural examination: Secondary | ICD-10-CM | POA: Insufficient documentation

## 2011-06-30 NOTE — Telephone Encounter (Signed)
Left message for patient to call me.  Left phone number. 

## 2011-06-30 NOTE — Telephone Encounter (Signed)
Left message for patient to call me.  Left phone number.

## 2011-06-30 NOTE — Progress Notes (Signed)
  Echocardiogram 2D Echocardiogram has been performed.  Emelia Loron A 06/30/2011, 10:48 AM

## 2011-07-03 ENCOUNTER — Other Ambulatory Visit: Payer: BC Managed Care – PPO

## 2011-07-03 ENCOUNTER — Encounter: Payer: BC Managed Care – PPO | Admitting: Genetic Counselor

## 2011-07-04 ENCOUNTER — Other Ambulatory Visit: Payer: BC Managed Care – PPO | Admitting: Lab

## 2011-07-04 ENCOUNTER — Encounter (HOSPITAL_COMMUNITY)
Admission: RE | Admit: 2011-07-04 | Discharge: 2011-07-04 | Disposition: A | Discharge status: Home / Self Care | Payer: BC Managed Care – PPO | Source: Ambulatory Visit | Attending: Oncology | Admitting: Oncology

## 2011-07-04 ENCOUNTER — Other Ambulatory Visit (HOSPITAL_COMMUNITY): Payer: Self-pay

## 2011-07-04 ENCOUNTER — Encounter: Payer: Self-pay | Admitting: Genetic Counselor

## 2011-07-04 ENCOUNTER — Inpatient Hospital Stay (HOSPITAL_COMMUNITY)
Admission: EM | Admit: 2011-07-04 | Discharge: 2011-07-07 | DRG: 094 | Disposition: A | Discharge status: Home / Self Care | Payer: BC Managed Care – PPO | Attending: Surgery | Admitting: Surgery

## 2011-07-04 ENCOUNTER — Emergency Department (HOSPITAL_COMMUNITY): Payer: BC Managed Care – PPO

## 2011-07-04 ENCOUNTER — Encounter (HOSPITAL_COMMUNITY): Payer: Self-pay | Admitting: *Deleted

## 2011-07-04 ENCOUNTER — Encounter (HOSPITAL_COMMUNITY): Payer: Self-pay

## 2011-07-04 ENCOUNTER — Ambulatory Visit (HOSPITAL_BASED_OUTPATIENT_CLINIC_OR_DEPARTMENT_OTHER): Payer: BC Managed Care – PPO | Admitting: Genetic Counselor

## 2011-07-04 DIAGNOSIS — J9383 Other pneumothorax: Secondary | ICD-10-CM | POA: Insufficient documentation

## 2011-07-04 DIAGNOSIS — C50119 Malignant neoplasm of central portion of unspecified female breast: Secondary | ICD-10-CM | POA: Insufficient documentation

## 2011-07-04 DIAGNOSIS — C50919 Malignant neoplasm of unspecified site of unspecified female breast: Secondary | ICD-10-CM | POA: Diagnosis present

## 2011-07-04 DIAGNOSIS — C50419 Malignant neoplasm of upper-outer quadrant of unspecified female breast: Secondary | ICD-10-CM

## 2011-07-04 DIAGNOSIS — J939 Pneumothorax, unspecified: Secondary | ICD-10-CM | POA: Diagnosis present

## 2011-07-04 DIAGNOSIS — J95811 Postprocedural pneumothorax: Secondary | ICD-10-CM

## 2011-07-04 DIAGNOSIS — E871 Hypo-osmolality and hyponatremia: Secondary | ICD-10-CM | POA: Diagnosis present

## 2011-07-04 DIAGNOSIS — K7689 Other specified diseases of liver: Secondary | ICD-10-CM | POA: Insufficient documentation

## 2011-07-04 DIAGNOSIS — E876 Hypokalemia: Secondary | ICD-10-CM | POA: Diagnosis present

## 2011-07-04 DIAGNOSIS — B373 Candidiasis of vulva and vagina: Secondary | ICD-10-CM

## 2011-07-04 DIAGNOSIS — J9819 Other pulmonary collapse: Secondary | ICD-10-CM | POA: Insufficient documentation

## 2011-07-04 LAB — CBC
Hemoglobin: 15.6 g/dL — ABNORMAL HIGH (ref 12.0–15.0)
RBC: 4.64 MIL/uL (ref 3.87–5.11)

## 2011-07-04 LAB — CREATININE, SERUM
Creatinine, Ser: 0.62 mg/dL (ref 0.50–1.10)
GFR calc Af Amer: >90 mL/min (ref >90)
GFR calc non Af Amer: >90 mL/min (ref >90)

## 2011-07-04 LAB — GLUCOSE, CAPILLARY: Glucose-Capillary: 86 mg/dL (ref 70–99)

## 2011-07-04 MED ORDER — DIPHENHYDRAMINE HCL 50 MG/ML IJ SOLN: 12.5000 mg | Freq: Four times a day (QID) | INTRAMUSCULAR | Status: DC | PRN

## 2011-07-04 MED ORDER — HYDROMORPHONE HCL PF 1 MG/ML IJ SOLN
INTRAMUSCULAR | Status: AC
  Administered 2011-07-04: 0.3 mg
  Filled 2011-07-04: qty 1

## 2011-07-04 MED ORDER — SODIUM CHLORIDE 0.9 % IJ SOLN: 9.0000 mL | INTRAMUSCULAR | Status: DC | PRN

## 2011-07-04 MED ORDER — LACTATED RINGERS IV SOLN
INTRAVENOUS | Status: DC
  Administered 2011-07-04: 19:00:00 via INTRAVENOUS

## 2011-07-04 MED ORDER — DIPHENHYDRAMINE HCL 12.5 MG/5ML PO ELIX: 12.5000 mg | ORAL_SOLUTION | Freq: Four times a day (QID) | ORAL | Status: DC | PRN

## 2011-07-04 MED ORDER — HYDROMORPHONE 0.3 MG/ML IV SOLN
INTRAVENOUS | Status: DC
  Administered 2011-07-04: 2.59 mg via INTRAVENOUS
  Administered 2011-07-04: 13:00:00 via INTRAVENOUS
  Administered 2011-07-05: 0.199 mg via INTRAVENOUS
  Administered 2011-07-05 (×2): 0.4 mg via INTRAVENOUS
  Administered 2011-07-05: 0.999 mg via INTRAVENOUS
  Administered 2011-07-05: 17:00:00 via INTRAVENOUS
  Administered 2011-07-06: 0.799 mg via INTRAVENOUS
  Administered 2011-07-06: 1.56 mg via INTRAVENOUS
  Administered 2011-07-06: 0.7 mL via INTRAVENOUS
  Filled 2011-07-04: qty 25

## 2011-07-04 MED ORDER — CEFAZOLIN SODIUM 1-5 GM-% IV SOLN
1.0000 g | Freq: Three times a day (TID) | INTRAVENOUS | Status: DC
  Administered 2011-07-04 – 2011-07-07 (×9): 1 g via INTRAVENOUS
  Filled 2011-07-04 (×14): qty 50

## 2011-07-04 MED ORDER — HEPARIN SODIUM (PORCINE) 5000 UNIT/ML IJ SOLN
5000.0000 [IU] | Freq: Three times a day (TID) | INTRAMUSCULAR | Status: DC
  Administered 2011-07-04 – 2011-07-07 (×8): 5000 [IU] via SUBCUTANEOUS
  Filled 2011-07-04 (×16): qty 1

## 2011-07-04 MED ORDER — MORPHINE SULFATE 4 MG/ML IJ SOLN
INTRAMUSCULAR | Status: AC
  Administered 2011-07-04: 12:00:00
  Filled 2011-07-04: qty 1

## 2011-07-04 MED ORDER — MIDAZOLAM HCL 2 MG/2ML IJ SOLN
INTRAMUSCULAR | Status: AC
  Administered 2011-07-04: 12:00:00
  Filled 2011-07-04: qty 2

## 2011-07-04 MED ORDER — NALOXONE HCL 0.4 MG/ML IJ SOLN: 0.4000 mg | INTRAMUSCULAR | Status: DC | PRN

## 2011-07-04 MED ORDER — IOHEXOL 300 MG/ML  SOLN
100.0000 mL | Freq: Once | INTRAMUSCULAR | Status: AC | PRN
  Administered 2011-07-04: 100 mL via INTRAVENOUS

## 2011-07-04 MED ORDER — FLUDEOXYGLUCOSE F - 18 (FDG) INJECTION
17.3000 | Freq: Once | INTRAVENOUS | Status: AC | PRN
  Administered 2011-07-04: 17.3 via INTRAVENOUS

## 2011-07-04 MED ORDER — ONDANSETRON HCL 4 MG/2ML IJ SOLN
4.0000 mg | Freq: Four times a day (QID) | INTRAMUSCULAR | Status: DC | PRN
  Administered 2011-07-04 – 2011-07-05 (×2): 4 mg via INTRAVENOUS
  Filled 2011-07-04 (×2): qty 2

## 2011-07-04 NOTE — ED Notes (Signed)
Pt. Feeling nauseated.  Zofran PRN given.

## 2011-07-04 NOTE — H&P (Signed)
NAME: Stacey Cross DOB: May 29, 1970 MRN: [id number]                                                                                      DATE: 07/04/2011  PCP: [name], MD, MD  IMPRESSION:  Right PTX, S/P PAC placed on March 8  PLAN:   Right CT placed in ED, will admit for management                 CC:  Pneumothroax  HPI:  Stacey Cross is a 41 y.o.  female who presents to the ED for evaluation of PTXD. This was noted on a CT today for stging for her breast cancer. She has noted over the last several days a decrease in energy, but no real pain. She ran three miles yesterday  PMH:  has a past medical history of Breast cancer.  PSH:   has past surgical history that includes Cesarean section (2003); Dilation and curettage of uterus (2002); and Portacath placement (06/23/2011).  ALLERGIES:  No Known Allergies  MEDICATIONS: Current facility-administered medications:lactated ringers infusion, , Intravenous, Continuous, [name], MD;  midazolam (VERSED) 2 MG/2ML injection, , , , ;  morphine 4 MG/ML injection, , , , ;  morphine 4 MG/ML injection, , , ,  Current outpatient prescriptions:aspirin 81 MG tablet, Take 81 mg by mouth daily., Disp: , Rfl: ;  HYDROcodone-acetaminophen (NORCO) 5-325 MG per tablet, Take 1 tablet by mouth every 4 (four) hours as needed for pain., Disp: 10 tablet, Rfl: 0;  lidocaine-prilocaine (EMLA) cream, Apply topically as needed., Disp: 30 g, Rfl: 3 Facility-Administered Medications Ordered in Other Encounters: fludeoxyglucose F - 18 (FDG) injection 17.3 milli Curie, 17.3 milli Curie, Intravenous, Once PRN, Medication Radiologist, MD, 17.3 milli Curie at 07/04/11 0715;  iohexol (OMNIPAQUE) 300 MG/ML solution 100 mL, 100 mL, Intravenous, Once PRN, Medication Radiologist, MD, 100 mL at 07/04/11 0839  ROS: See old notes  EXAM:    Vital Signs See ED notes GENERAL:  The patient is alert, oriented, and generally healthy-appearing, NAD. Anxious aboutr  recent PTX    NECK:  The neck is supple and there are no masses or thyromegaly.  LUNGS: Decreased BS Right  HEART: Regular rhythm, with no murmurs rubs or gallops. Pulses are intact carotid dorsalis pedis and posterior tibial. No significant varicosities are noted.      DATA REVIEWED:  CT reviewed with radiologist    Stacey Cross J 07/04/2011  CC: No ref. provider found, [name], MD, MD

## 2011-07-04 NOTE — Progress Notes (Signed)
Ms. Mehgan Santmyer is a 41 year old woman who was seen in clinic to review the [facility] test results that were performed during her visit to the [facility] clinic on June 21, 2011.  Ms. Bouchillon was found to have a BRCA1 mutation.  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  Looking at her family history, it appears that she inherited this from her father, as there is a paternal cousin who was diagnosed with breast cancer at age 8, an uncle with prostate cancer, an uncle with melanoma and a grandmother who died at age 102, in the late 65s, of "torso" cancer.  We reviewed the dominant inheritance pattern of HBOC, the risks to other family members based on her test result.  We dicussed that her living aunts and uncles should be tested.  If they test negative, then their descendents would not need to be tested.  The children of aunts and uncles who have died are eligible to be tested.  Ms. Hopfer felt that her uncle could help her get in touch with her cousins to let them know of their risk.  She also thinks that he may have access to his cousins to let them know of this result.  Her family history was updated in collecting more information on her cousins on the paternal side of the family, and updating the cancer history she had gathered after learning of her diagnosis.  This included a diagnosis of melanoma in a paternal uncle.  Ms. Hoselton was tearful when discussing her test results, and expressed her wish that she had found out about this result prior to her diagnosis of cancer.  She is concerned about the risk to her children as well as other family members.  She has a friend who also has a BRCA mutation, who can give her support.  I gave her information about the support group, [employer].  I also offered to provide her contact information for genetic counselors in her family member's areas, which is primarily in the [place].  All questions were answered.  The patient was  seen for 30 minutes, with over 50% of the time spent on counseling.  The case was reviewed with Dr. Drue Second

## 2011-07-04 NOTE — ED Notes (Signed)
Patient using PCA pump for pain control.Alert and oriented, family at bedside.  Pt. Conversing with family.

## 2011-07-04 NOTE — ED Notes (Signed)
Pt had port a cath placed on right side of chest by Dr. Jamey Ripa on Friday. Pt reports increase in shortness of breath since Friday. Generalized pain to right side of chest. Pt had CT this am and dx with pneumothorax to right side.

## 2011-07-04 NOTE — Op Note (Signed)
Stacey Cross 12-31-1970 [id number] 07/04/2011  Preoperative diagnosis: Pneumothorax, right  Postoperative diagnosis: Same  Procedure: Right chest tube  Surgeon: [name], MD, FACS  Assistant: None  Anesthesia: Local   Clinical History and Indications: Patient had port placed March 8 and post placement cxr initially thought OK. Today having PET Ct and moderate right PTX noted. Patient came to ED and needs CT placed. Discussed with the patient    Description of Procedure: In the ED we did a time out. The Right lateral chest was prepped and draped. 1%xylocaine was infiltraed in the mid-axillary line. An incision was mad and the tissues spread with a clamp. Additional local was placed and a 20 Fr. CT placed, with good return of air. It was sutured into place and a sterile dressing applied.   Patient was given some MS and Versed as we placed the tube for pain control.  CXR pending at the time of this note.  [name], MD, FACS 07/04/2011 11:44 AM

## 2011-07-04 NOTE — ED Notes (Signed)
[id number]<AV> Expected date:<BR> Expected time:<BR> Means of arrival:<BR> Comments:<BR> CA pt with Pneumothorax

## 2011-07-05 ENCOUNTER — Telehealth: Payer: Self-pay | Admitting: Oncology

## 2011-07-05 ENCOUNTER — Encounter (INDEPENDENT_AMBULATORY_CARE_PROVIDER_SITE_OTHER): Payer: BC Managed Care – PPO | Admitting: Surgery

## 2011-07-05 ENCOUNTER — Inpatient Hospital Stay (HOSPITAL_COMMUNITY): Payer: BC Managed Care – PPO

## 2011-07-05 ENCOUNTER — Encounter: Payer: Self-pay | Admitting: *Deleted

## 2011-07-05 ENCOUNTER — Other Ambulatory Visit: Payer: Self-pay | Admitting: Pharmacist

## 2011-07-05 ENCOUNTER — Telehealth: Payer: Self-pay | Admitting: *Deleted

## 2011-07-05 LAB — COMPREHENSIVE METABOLIC PANEL
ALT: 14 U/L (ref 0–35)
AST: 19 U/L (ref 0–37)
Albumin: 3.3 g/dL — ABNORMAL LOW (ref 3.5–5.2)
Alkaline Phosphatase: 45 U/L (ref 39–117)
Potassium: 3.3 mEq/L — ABNORMAL LOW (ref 3.5–5.1)
Sodium: 129 mEq/L — ABNORMAL LOW (ref 135–145)
Total Protein: 6.3 g/dL (ref 6.0–8.3)

## 2011-07-05 LAB — CBC
HCT: 38.8 % (ref 36.0–46.0)
Hemoglobin: 14.1 g/dL (ref 12.0–15.0)
MCHC: 36.3 g/dL — ABNORMAL HIGH (ref 30.0–36.0)
MCV: 91.1 fL (ref 78.0–100.0)
RDW: 11.6 % (ref 11.5–15.5)

## 2011-07-05 MED ORDER — KCL IN DEXTROSE-NACL 40-5-0.9 MEQ/L-%-% IV SOLN
INTRAVENOUS | Status: DC
  Administered 2011-07-05 – 2011-07-07 (×4): via INTRAVENOUS
  Filled 2011-07-05 (×6): qty 1000

## 2011-07-05 MED ORDER — HYDROMORPHONE 0.3 MG/ML IV SOLN
INTRAVENOUS | Status: AC
  Filled 2011-07-05: qty 25

## 2011-07-05 MED ORDER — POTASSIUM CHLORIDE CRYS ER 10 MEQ PO TBCR
10.0000 meq | EXTENDED_RELEASE_TABLET | Freq: Two times a day (BID) | ORAL | Status: DC
  Administered 2011-07-05 – 2011-07-07 (×5): 10 meq via ORAL
  Filled 2011-07-05 (×8): qty 1

## 2011-07-05 NOTE — Progress Notes (Signed)
Pneumothorax on right   Subjective: Less pain at CT site. Taking po's, not oob yet. No resp distress  Objective: Vital signs in last 24 hours: Temp:  [97.7 F (36.5 C)-98.7 F (37.1 C)] 98.6 F (37 C) (03/20 0612) Pulse Rate:  [60-77] 60  (03/20 0612) Resp:  [10-24] 16  (03/20 0612) BP: (104-151)/(65-97) 104/70 mmHg (03/20 0612) SpO2:  [97 %-100 %] 98 % (03/20 0612) Weight:  [135 lb (61.236 kg)] 135 lb (61.236 kg) (03/20 0225)    Intake/Output from previous day:   Intake/Output this shift:    General appearance: alert and mild distress Resp: clear to auscultation bilaterally and CT not bubbling  Lab Results:  Results for orders placed during the hospital encounter of 07/04/11 (from the past 24 hour(s))  CBC     Status: Abnormal   Collection Time   07/04/11 12:00 PM      Component Value Range   WBC 5.8  4.0 - 10.5 (K/uL)   RBC 4.64  3.87 - 5.11 (MIL/uL)   Hemoglobin 15.6 (*) 12.0 - 15.0 (g/dL)   HCT 16.1  09.6 - 04.5 (%)   MCV 91.4  78.0 - 100.0 (fL)   MCH 33.6  26.0 - 34.0 (pg)   MCHC 36.8 (*) 30.0 - 36.0 (g/dL)   RDW 40.9  81.1 - 91.4 (%)   Platelets 144 (*) 150 - 400 (K/uL)  CREATININE, SERUM     Status: Normal   Collection Time   07/04/11 12:00 PM      Component Value Range   Creatinine, Ser 0.62  0.50 - 1.10 (mg/dL)   GFR calc non Af Amer >90  >90 (mL/min)   GFR calc Af Amer >90  >90 (mL/min)  COMPREHENSIVE METABOLIC PANEL     Status: Abnormal   Collection Time   07/05/11  5:32 AM      Component Value Range   Sodium 129 (*) 135 - 145 (mEq/L)   Potassium 3.3 (*) 3.5 - 5.1 (mEq/L)   Chloride 96  96 - 112 (mEq/L)   CO2 26  19 - 32 (mEq/L)   Glucose, Bld 115 (*) 70 - 99 (mg/dL)   BUN 6  6 - 23 (mg/dL)   Creatinine, Ser 7.82 (*) 0.50 - 1.10 (mg/dL)   Calcium 8.3 (*) 8.4 - 10.5 (mg/dL)   Total Protein 6.3  6.0 - 8.3 (g/dL)   Albumin 3.3 (*) 3.5 - 5.2 (g/dL)   AST 19  0 - 37 (U/L)   ALT 14  0 - 35 (U/L)   Alkaline Phosphatase 45  39 - 117 (U/L)   Total  Bilirubin 0.7  0.3 - 1.2 (mg/dL)   GFR calc non Af Amer >90  >90 (mL/min)   GFR calc Af Amer >90  >90 (mL/min)  CBC     Status: Abnormal   Collection Time   07/05/11  5:32 AM      Component Value Range   WBC 6.9  4.0 - 10.5 (K/uL)   RBC 4.26  3.87 - 5.11 (MIL/uL)   Hemoglobin 14.1  12.0 - 15.0 (g/dL)   HCT 95.6  21.3 - 08.6 (%)   MCV 91.1  78.0 - 100.0 (fL)   MCH 33.1  26.0 - 34.0 (pg)   MCHC 36.3 (*) 30.0 - 36.0 (g/dL)   RDW 57.8  46.9 - 62.9 (%)   Platelets 169  150 - 400 (K/uL)     Studies/Results Radiology     MEDS, Scheduled    .  ceFAZolin (ANCEF) IV  1 g Intravenous Q8H  . heparin  5,000 Units Subcutaneous Q8H  . HYDROmorphone      . HYDROmorphone PCA 0.3 mg/mL   Intravenous Q4H  . midazolam      . morphine      . morphine         Assessment: Pneumothorax on right Await CXR for assessment Mild hyponatremia and hypokalemia  Plan: Check cxr Adjust ivf Recheck labs in am PO KCL    LOS: 1 day    [name], MD, [facility], [place] [phone]   07/05/2011 7:38 AM

## 2011-07-05 NOTE — Plan of Care (Signed)
Problem: Phase I Progression Outcomes Goal: Pain controlled with appropriate interventions Outcome: Progressing Pt pain level at a tolerable level of less than 7

## 2011-07-05 NOTE — Progress Notes (Unsigned)
Per MD, notified Marcelino Duster in infusion that pt would not be rec tx as previously scheduled 07/06/11

## 2011-07-05 NOTE — Progress Notes (Signed)
Gave Stacey Cross an incentive spirometer and instructed her on the proper use. Patient returned proper demonstration with nurse in the room.

## 2011-07-05 NOTE — Telephone Encounter (Signed)
Genetic letter/report mailed out.

## 2011-07-05 NOTE — Telephone Encounter (Signed)
per orders from nurse cancelling the patient appointment for 07-06-2011

## 2011-07-06 ENCOUNTER — Encounter: Payer: Self-pay | Admitting: Oncology

## 2011-07-06 ENCOUNTER — Encounter: Payer: Self-pay | Admitting: *Deleted

## 2011-07-06 ENCOUNTER — Ambulatory Visit: Payer: BC Managed Care – PPO | Admitting: Oncology

## 2011-07-06 ENCOUNTER — Inpatient Hospital Stay (HOSPITAL_COMMUNITY): Payer: BC Managed Care – PPO

## 2011-07-06 ENCOUNTER — Other Ambulatory Visit: Payer: BC Managed Care – PPO | Admitting: Lab

## 2011-07-06 ENCOUNTER — Ambulatory Visit: Payer: BC Managed Care – PPO

## 2011-07-06 LAB — BASIC METABOLIC PANEL
BUN: 4 mg/dL — ABNORMAL LOW (ref 6–23)
Creatinine, Ser: 0.55 mg/dL (ref 0.50–1.10)
GFR calc Af Amer: >90 mL/min (ref >90)
GFR calc non Af Amer: >90 mL/min (ref >90)
Glucose, Bld: 96 mg/dL (ref 70–99)

## 2011-07-06 MED ORDER — OXYCODONE-ACETAMINOPHEN 5-325 MG PO TABS
1.0000 | ORAL_TABLET | ORAL | Status: DC | PRN
  Administered 2011-07-07 (×3): 1 via ORAL
  Filled 2011-07-06 (×2): qty 2

## 2011-07-06 MED ORDER — OXYCODONE-ACETAMINOPHEN 5-325 MG PO TABS
1.0000 | ORAL_TABLET | ORAL | Status: DC | PRN
  Administered 2011-07-06 (×2): 1 via ORAL
  Filled 2011-07-06 (×3): qty 1

## 2011-07-06 NOTE — Progress Notes (Signed)
Pneumothorax on right   Subjective: Pain improved, breathing easy, taking more PO, overall feel improved.  Objective: Vital signs in last 24 hours: Temp:  [97.5 F (36.4 C)-98.6 F (37 C)] 97.5 F (36.4 C) (03/21 1348) Pulse Rate:  [65-78] 66  (03/21 1348) Resp:  [16-20] 20  (03/21 1348) BP: (119-130)/(66-85) 130/85 mmHg (03/21 1348) SpO2:  [96 %-97 %] 96 % (03/21 1348) Last BM Date: 07/03/11  Intake/Output from previous day: 03/20 0701 - 03/21 0700 In: 3737.5 [P.O.:480; I.V.:3007.5; IV Piggyback:250] Out: -  Intake/Output this shift:    General appearance: alert, no distress and mild distress Resp: clear to auscultation bilaterally and normal respirations  Lab Results:  Results for orders placed during the hospital encounter of 07/04/11 (from the past 24 hour(s))  BASIC METABOLIC PANEL     Status: Abnormal   Collection Time   07/06/11  5:25 AM      Component Value Range   Sodium 138  135 - 145 (mEq/L)   Potassium 4.4  3.5 - 5.1 (mEq/L)   Chloride 103  96 - 112 (mEq/L)   CO2 30  19 - 32 (mEq/L)   Glucose, Bld 96  70 - 99 (mg/dL)   BUN 4 (*) 6 - 23 (mg/dL)   Creatinine, Ser 1.61  0.50 - 1.10 (mg/dL)   Calcium 8.7  8.4 - 09.6 (mg/dL)   GFR calc non Af Amer >90  >90 (mL/min)   GFR calc Af Amer >90  >90 (mL/min)     Studies/Results Radiology Today's CXR - noPTX     MEDS, Scheduled    .  ceFAZolin (ANCEF) IV  1 g Intravenous Q8H  . heparin  5,000 Units Subcutaneous Q8H  . HYDROmorphone PCA 0.3 mg/mL      . potassium chloride  10 mEq Oral BID  . DISCONTD: HYDROmorphone PCA 0.3 mg/mL   Intravenous Q4H     Assessment: Pneumothorax on rightLung now up on CXR Lytes now corrected CT probably out tomorrow  Plan: CXR in AM    LOS: 2 days    [name], MD, [facility], [place] [phone]   07/06/2011 8:54 PM

## 2011-07-06 NOTE — Progress Notes (Signed)
Patient denied financial assistance due to income being over 77,000.00 for family of 5

## 2011-07-06 NOTE — Progress Notes (Signed)
Pt still in the hospital for pneumothorax.  Pt will not receive chemo today, appointments cancelled for chemo and injection.

## 2011-07-07 ENCOUNTER — Ambulatory Visit: Payer: BC Managed Care – PPO

## 2011-07-07 ENCOUNTER — Inpatient Hospital Stay (HOSPITAL_COMMUNITY): Payer: BC Managed Care – PPO

## 2011-07-07 ENCOUNTER — Telehealth (INDEPENDENT_AMBULATORY_CARE_PROVIDER_SITE_OTHER): Payer: Self-pay

## 2011-07-07 MED ORDER — FLUCONAZOLE 200 MG PO TABS
200.0000 mg | ORAL_TABLET | Freq: Once | ORAL | Status: AC
  Administered 2011-07-07: 200 mg via ORAL
  Filled 2011-07-07: qty 1

## 2011-07-07 NOTE — Discharge Summary (Signed)
Physician Discharge Summary  Patient ID: Stacey Cross MRN: [id number] DOB/AGE: Nov 19, 1970 41 y.o.  Admit date: 07/04/2011 Discharge date: 07/07/2011  Admission Diagnoses:  Discharge Diagnoses:  Principal Problem:  *Pneumothorax on right   Discharged Condition: good  Hospital Course: Patient had right chest tube for right PTX. Did well and able to be discharged after tube removed.  Consults: None  Significant Diagnostic Studies: X rays showed resolution of PTX. CXR after tube removed showed tiny apical ptx stable after six hours  Treatments: Chest tube  Discharge Exam: Blood pressure 114/74, pulse 69, temperature 97.7 F (36.5 C), temperature source Oral, resp. rate 16, height 5\' 5"  (1.651 m), weight 135 lb (61.236 kg), last menstrual period 06/30/2011, SpO2 96.00%. General appearance: alert and no distress Resp: clear to auscultation bilaterally  Disposition: 01-Home or Self Care  Discharge Orders    Future Appointments: Provider: Department: Dept Phone: Center:   07/13/2011 8:30 AM Windell Hummingbird [facility] [phone] None   07/13/2011 9:00 AM Theotis Barrio, FNP [facility] [phone] None   07/20/2011 9:00 AM Windell Hummingbird [facility] [phone] None   07/20/2011 9:30 AM Theotis Barrio, FNP [facility] [phone] None   07/20/2011 11:00 AM [facility] G24 [facility] [phone] None   07/21/2011 2:30 PM [facility] Inj Nurse [facility] [phone] None   07/27/2011 9:30 AM Mauri Brooklyn [facility] [phone] None   07/27/2011 10:00 AM Theotis Barrio, FNP [facility] [phone] None   08/03/2011 11:30 AM Windell Hummingbird [facility] [phone] None   08/03/2011 12:00 PM Victorino December, MD [facility] [phone] None   08/03/2011 1:30 PM [facility] F19 [facility] [phone] None   08/04/2011 2:15 PM [facility] Inj Nurse [facility] [phone] None   08/10/2011 8:30 AM Windell Hummingbird [facility] [phone] None   08/10/2011 9:00 AM Theotis Barrio, FNP [facility] [phone] None   08/17/2011 8:30 AM Windell Hummingbird [facility] [phone] None   08/17/2011 9:00 AM Victorino December, MD [facility] [phone] None   08/17/2011 10:30 AM [facility] G22 [facility] [phone] None   08/18/2011 2:30 PM [facility] Inj Nurse [facility] [phone] None   08/24/2011 10:30 AM Dava Najjar Idelle Jo [facility] [phone] None   08/24/2011 11:00 AM Theotis Barrio, FNP [facility] [phone] None   08/31/2011 10:30 AM Dava Najjar Idelle Jo [facility] [phone] None   08/31/2011 11:00 AM Theotis Barrio, FNP [facility] [phone] None   08/31/2011 12:15 PM [facility] G23 [facility] [phone] None   09/01/2011 2:30 PM [facility] Inj Nurse [facility] [phone] None   09/07/2011 10:30 AM Windell Hummingbird [facility] [phone] None   09/07/2011 11:00 AM Victorino December, MD [facility] [phone] None   09/14/2011 11:00 AM Krista Blue [facility] [phone] None   09/14/2011 11:30 AM Theotis Barrio, FNP [facility] [phone] None   09/14/2011 1:00 PM [facility] A1 [facility] [phone] None   09/15/2011 2:30 PM [facility] Inj Nurse [facility] [phone] None   09/21/2011 11:00 AM Krista Blue [facility] [phone] None   09/21/2011 11:30 AM Victorino December, MD [facility] [phone] None   09/28/2011 11:30 AM Dava Najjar Idelle Jo [facility] [phone] None   09/28/2011 12:00 PM Victorino December, MD [facility] [phone] None   09/28/2011 1:15 PM [facility] A2 [facility] [phone] None   09/29/2011 2:30 PM [facility] Inj Nurse [facility] [phone] None   10/05/2011 9:00 AM Micah Flesher  Joelene Millin [facility] [phone] None   10/05/2011 9:30 AM Theotis Barrio, FNP [facility] [phone] None   10/12/2011 11:30 AM Windell Hummingbird [facility] [phone] None   10/12/2011 12:00 PM Victorino December, MD [facility] [phone] None    10/12/2011 1:30 PM [facility] A1 [facility] [phone] None   10/13/2011 2:30 PM [facility] Inj Nurse [facility] [phone] None     Future Orders Please Complete By Expires   Diet - low sodium heart healthy      Increase activity slowly      Discharge instructions      Comments:   Call if any problems or concerns [phone] NO STRENUOUS EXERCISE - DO NOT EXERCISE TO THE POINT YOU ARE SHORT OF BREATH - FOR ONE WEEK Take norco, one every four hours as needed for pain. We have called a prescription to [facility], [place]   Remove dressing in 48 hours      Comments:   OK to shower after dressing is removed, but put a fresh sterile gauze dressing on. Apply some antibiotic ointment when the dressing is changed.     Medication List  As of 07/07/2011  4:04 PM   TAKE these medications         aspirin 325 MG tablet   Take 325 mg by mouth daily.      guaiFENesin 600 MG 12 hr tablet   Commonly known as: MUCINEX   Take 600 mg by mouth 2 (two) times daily.      HYDROcodone-acetaminophen 5-325 MG per tablet   Commonly known as: NORCO   Take 1 tablet by mouth every 4 (four) hours as needed. For pain      lidocaine-prilocaine cream   Commonly known as: EMLA   Apply topically as needed.           Follow-up Information    Follow up with [facility] in 1 day. (Have my nurse Jade remove your sutures mid week next week)    Contact information:   [address] [place] [address] [phone]         Signed: [name] 07/07/2011, 4:04 PM

## 2011-07-07 NOTE — Telephone Encounter (Signed)
Rx Narco  5/325  #20 take 1 every 4 hours by mouth as needed for pain -no refills- called to [facility] on [address] ( [facility] did not have med). Patient aware. Per. Dr. Jamey Ripa.

## 2011-07-07 NOTE — Progress Notes (Signed)
Notified [facility] of chest x-ray being completed. Spoke with Dr. Tenna Child nurse.  Stacey Cross, Stacey Cross L 07/07/2011 9:53 AM

## 2011-07-07 NOTE — Progress Notes (Signed)
Pneumothorax on right   Subjective: Feels OK and improved  Objective: Vital signs in last 24 hours: Temp:  [97.5 F (36.4 C)-98.4 F (36.9 C)] 97.9 F (36.6 C) (03/22 0607) Pulse Rate:  [62-80] 62  (03/22 0607) Resp:  [16-20] 18  (03/22 0607) BP: (118-154)/(82-94) 118/82 mmHg (03/22 0607) SpO2:  [95 %-96 %] 96 % (03/22 0607) Last BM Date: 07/03/11  Intake/Output from previous day: 03/21 0701 - 03/22 0700 In: 2565 [P.O.:480; I.V.:1885; IV Piggyback:200] Out: 0  Intake/Output this shift:    General appearance: alert and no distress Resp: Normal respirations, CXR no ptx  Lab Results:  No results found for this or any previous visit (from the past 24 hour(s)).   Studies/Results Radiology     MEDS, Scheduled    .  ceFAZolin (ANCEF) IV  1 g Intravenous Q8H  . heparin  5,000 Units Subcutaneous Q8H  . potassium chloride  10 mEq Oral BID  . DISCONTD: HYDROmorphone PCA 0.3 mg/mL   Intravenous Q4H     Assessment: Pneumothorax on right Resolved and CT can come out  Plan: CT removed without problem. CXR ordered  LOS: 3 days    [name], MD, [facility], [place] [phone]   07/07/2011 7:43 AM

## 2011-07-10 ENCOUNTER — Other Ambulatory Visit: Payer: Self-pay | Admitting: Oncology

## 2011-07-10 ENCOUNTER — Encounter: Payer: Self-pay | Admitting: *Deleted

## 2011-07-10 ENCOUNTER — Telehealth (INDEPENDENT_AMBULATORY_CARE_PROVIDER_SITE_OTHER): Payer: Self-pay | Admitting: General Surgery

## 2011-07-10 NOTE — Telephone Encounter (Signed)
Message copied by Liliana Cline on Mon Jul 10, 2011  8:47 AM ------      Message from: [name]      Created: Mon Jul 10, 2011  8:06 AM       She went home Friday after her chest tube was removed. Please call to be sure no problems,            thnaks

## 2011-07-10 NOTE — Telephone Encounter (Signed)
Left message for patient to call me back and ask directly for me.

## 2011-07-10 NOTE — Telephone Encounter (Signed)
Spoke with patient, she is doing well. Appt made for Wednesday for suture removal and next week with Dr Jamey Ripa. Patient will call with any problems prior.

## 2011-07-10 NOTE — Telephone Encounter (Signed)
Left message for patient to call me back. 

## 2011-07-10 NOTE — Progress Notes (Signed)
Mailed after appt letter to pt. 

## 2011-07-10 NOTE — Telephone Encounter (Signed)
Pt reports she was released from hospital last Friday, after Dr. Jamey Ripa pulled a chest tube.  She stated she was instructed to "call Stacey Cross to remove sutures on Wednesday."  Please call her at work with appt information:  [phone]

## 2011-07-11 ENCOUNTER — Telehealth: Payer: Self-pay | Admitting: Oncology

## 2011-07-11 NOTE — Telephone Encounter (Signed)
lmonvm adviisng the pt of her added appts for march and April and for her to pick up a new appt schedule when she comes in this thursday

## 2011-07-12 ENCOUNTER — Ambulatory Visit (INDEPENDENT_AMBULATORY_CARE_PROVIDER_SITE_OTHER): Payer: BC Managed Care – PPO | Admitting: General Surgery

## 2011-07-12 ENCOUNTER — Encounter (INDEPENDENT_AMBULATORY_CARE_PROVIDER_SITE_OTHER): Payer: Self-pay

## 2011-07-12 ENCOUNTER — Encounter: Payer: Self-pay | Admitting: Oncology

## 2011-07-12 VITALS — BP 122/86 | HR 60 | Temp 98.6°F | Resp 12 | Ht 65.5 in | Wt 141.0 lb

## 2011-07-12 DIAGNOSIS — IMO0002 Reserved for concepts with insufficient information to code with codable children: Secondary | ICD-10-CM

## 2011-07-12 NOTE — Progress Notes (Signed)
Two sutures removed under right axilla area. Area cleaned and steri-strips applied. Patient instructed to allow steri-strips to fall off own their own. Instructed to avoid her routine running until released by Dr. Jamey Ripa. She has an appointment with Dr. Jamey Ripa on Friday.  Rito Ehrlich, RN.

## 2011-07-12 NOTE — Progress Notes (Signed)
Faxed husband's fmla papers to [place]  @ [phone], put original in registration desk.

## 2011-07-13 ENCOUNTER — Encounter: Payer: Self-pay | Admitting: Family

## 2011-07-13 ENCOUNTER — Ambulatory Visit (HOSPITAL_BASED_OUTPATIENT_CLINIC_OR_DEPARTMENT_OTHER): Payer: BC Managed Care – PPO

## 2011-07-13 ENCOUNTER — Ambulatory Visit: Payer: BC Managed Care – PPO | Admitting: Family

## 2011-07-13 ENCOUNTER — Other Ambulatory Visit: Payer: BC Managed Care – PPO | Admitting: Lab

## 2011-07-13 VITALS — BP 127/82 | HR 75 | Temp 98.6°F | Ht 65.5 in | Wt 144.0 lb

## 2011-07-13 DIAGNOSIS — B373 Candidiasis of vulva and vagina: Secondary | ICD-10-CM

## 2011-07-13 DIAGNOSIS — Z5111 Encounter for antineoplastic chemotherapy: Secondary | ICD-10-CM

## 2011-07-13 DIAGNOSIS — C50419 Malignant neoplasm of upper-outer quadrant of unspecified female breast: Secondary | ICD-10-CM

## 2011-07-13 DIAGNOSIS — C50919 Malignant neoplasm of unspecified site of unspecified female breast: Secondary | ICD-10-CM

## 2011-07-13 LAB — CBC WITH DIFFERENTIAL/PLATELET
Basophils Absolute: 0 10*3/uL (ref 0.0–0.1)
EOS%: 2.3 % (ref 0.0–7.0)
HCT: 39.5 % (ref 34.8–46.6)
HGB: 14.4 g/dL (ref 11.6–15.9)
LYMPH%: 39 % (ref 14.0–49.7)
MCH: 33.1 pg (ref 25.1–34.0)
MCV: 90.8 fL (ref 79.5–101.0)
MONO%: 12 % (ref 0.0–14.0)
NEUT%: 46.1 % (ref 38.4–76.8)
Platelets: 170 10*3/uL (ref 145–400)
lymph#: 1.9 10*3/uL (ref 0.9–3.3)

## 2011-07-13 LAB — COMPREHENSIVE METABOLIC PANEL
Albumin: 4.1 g/dL (ref 3.5–5.2)
Alkaline Phosphatase: 43 U/L (ref 39–117)
BUN: 10 mg/dL (ref 6–23)
Calcium: 8.8 mg/dL (ref 8.4–10.5)
Glucose, Bld: 89 mg/dL (ref 70–99)
Potassium: 3.9 mEq/L (ref 3.5–5.3)

## 2011-07-13 MED ORDER — PALONOSETRON HCL INJECTION 0.25 MG/5ML
0.2500 mg | Freq: Once | INTRAVENOUS | Status: AC
  Administered 2011-07-13: 0.25 mg via INTRAVENOUS

## 2011-07-13 MED ORDER — SODIUM CHLORIDE 0.9 % IV SOLN
150.0000 mg | Freq: Once | INTRAVENOUS | Status: AC
  Administered 2011-07-13: 150 mg via INTRAVENOUS
  Filled 2011-07-13: qty 5

## 2011-07-13 MED ORDER — SODIUM CHLORIDE 0.9 % IV SOLN
600.0000 mg/m2 | Freq: Once | INTRAVENOUS | Status: AC
  Administered 2011-07-13: 1040 mg via INTRAVENOUS
  Filled 2011-07-13: qty 52

## 2011-07-13 MED ORDER — SODIUM CHLORIDE 0.9 % IJ SOLN
10.0000 mL | INTRAMUSCULAR | Status: DC | PRN
  Administered 2011-07-13: 10 mL
  Filled 2011-07-13: qty 10

## 2011-07-13 MED ORDER — DOXORUBICIN HCL CHEMO IV INJECTION 2 MG/ML
60.0000 mg/m2 | Freq: Once | INTRAVENOUS | Status: AC
  Administered 2011-07-13: 104 mg via INTRAVENOUS
  Filled 2011-07-13: qty 52

## 2011-07-13 MED ORDER — FLUCONAZOLE 200 MG PO TABS: 200.0000 mg | ORAL_TABLET | Freq: Every day | ORAL | Status: AC

## 2011-07-13 MED ORDER — SODIUM CHLORIDE 0.9 % IV SOLN
Freq: Once | INTRAVENOUS | Status: AC
  Administered 2011-07-13: 10:00:00 via INTRAVENOUS

## 2011-07-13 MED ORDER — DEXAMETHASONE SODIUM PHOSPHATE 4 MG/ML IJ SOLN
12.0000 mg | Freq: Once | INTRAMUSCULAR | Status: AC
  Administered 2011-07-13: 12 mg via INTRAVENOUS

## 2011-07-13 MED ORDER — HEPARIN SOD (PORK) LOCK FLUSH 100 UNIT/ML IV SOLN
500.0000 [IU] | Freq: Once | INTRAVENOUS | Status: AC | PRN
  Administered 2011-07-13: 500 [IU]
  Filled 2011-07-13: qty 5

## 2011-07-13 NOTE — Progress Notes (Signed)
[facility]  Name: Stacey Cross                  DATE: 07/13/2011 MRN: [id number]                      DOB: 05-29-1970  REFERRING PHYSICIAN: Levi Aland, MD  DIAGNOSIS: Patient Active Problem List  Diagnoses Date Noted  . Pneumothorax on right 07/04/2011  . Cancer of upper-outer quadrant of female breast 06/13/2011     Encounter Diagnosis  Name Primary?  . Breast cancer, triple negative. BRCA 1 positive.  Yes    PREVIOUS THERAPY:  1. Left breast biopsy 06/09/11 2. Bilateral breast MRI 06/14/11. 3. Port a cath placement with subsequent pneumothorax 06/23/11.  4. Baseline echo 06/30/11. 4. PET 07/04/11.   CURRENT THERAPY: Begin dose dense neoadjuvant AC today, 4 cycles planned. Neulasta on day 2.   INTERIM HISTORY: Chemo was originally planned to begin last week, postponed for pneumothorax after port-a-cath placement.   Feels well, no dyspnea. Follows up with Dr. Jamey Ripa. Is anxious to be released to resume her hobby of running. Is understandably anxious about beginning chemotherapy today, has several questions about timing of pre-medications. She takes no routine meds so the pre-med regimen is a little daunting for her. We go over the dosing schedule in detail.   Reports recurrent vaginal yeast infection. Received one dose of Diflucan in the hospital, with slight improvement of symptoms for a short time.   PHYSICAL EXAM: BP 127/82  Pulse 75  Temp(Src) 98.6 F (37 C) (Oral)  Ht 5' 5.5" (1.664 m)  Wt 144 lb (65.318 kg)  BMI 23.60 kg/m2  LMP 06/30/2011 General: Well developed, well nourished, in no acute distress. Accompanied by her husband who is supportive.  EENT: No ocular or oral lesions. No stomatitis.  Respiratory: Lungs are clear to auscultation bilaterally with normal respiratory movement and no accessory muscle use. Cardiac: No murmur, rub or tachycardia. No upper or lower extremity edema.  GI: Abdomen is soft, no palpable hepatosplenomegaly. No fluid  wave. No tenderness. Musculoskeletal: No kyphosis, no tenderness over the spine, ribs or hips. Lymph: No cervical, infraclavicular, axillary or inguinal adenopathy. Neuro: No focal neurological deficits. Psych: Alert and oriented X 3, appropriate mood and affect.    LABORATORY STUDIES:   Results for orders placed in visit on 07/13/11  CBC WITH DIFFERENTIAL      Component Value Range   WBC 4.8  3.9 - 10.3 (10e3/uL)   NEUT# 2.2  1.5 - 6.5 (10e3/uL)   HGB 14.4  11.6 - 15.9 (g/dL)   HCT 40.9  81.1 - 91.4 (%)   Platelets 170  145 - 400 (10e3/uL)   MCV 90.8  79.5 - 101.0 (fL)   MCH 33.1  25.1 - 34.0 (pg)   MCHC 36.5 (*) 31.5 - 36.0 (g/dL)   RBC 7.82  9.56 - 2.13 (10e6/uL)   RDW 11.8  11.2 - 14.5 (%)   lymph# 1.9  0.9 - 3.3 (10e3/uL)   MONO# 0.6  0.1 - 0.9 (10e3/uL)   Eosinophils Absolute 0.1  0.0 - 0.5 (10e3/uL)   Basophils Absolute 0.0  0.0 - 0.1 (10e3/uL)   NEUT% 46.1  38.4 - 76.8 (%)   LYMPH% 39.0  14.0 - 49.7 (%)   MONO% 12.0  0.0 - 14.0 (%)   EOS% 2.3  0.0 - 7.0 (%)   BASO% 0.6  0.0 - 2.0 (%)   nRBC 0  0 -  0 (%)    IMPRESSION:  41 year old female with:  1. Left breast cancer, here to begin neoadjuvant dose dense AC. 2. BRCA-1 positive. 3. Vaginal candidiasis.   PLAN:   1. Treatment today.  2. Prescription for Diflucan 200 mg daily x 3 days. (escribed).  3. Neulasta injection tomorrow.  4. Return in 1 week for appt with me and lab check. Lab orders are entered for that visit.

## 2011-07-13 NOTE — Patient Instructions (Signed)
[facility] Discharge Instructions for Patients Receiving Chemotherapy  Today you received the following chemotherapy agents Adriamycin/Cytoxan  To help prevent nausea and vomiting after your treatment, we encourage you to take your nausea medication as prescribed Begin taking it at needed and take it as often as prescribed for the next 2-3 days   If you develop nausea and vomiting that is not controlled by your nausea medication, call the clinic. If it is after clinic hours your family physician or the after hours number for the clinic or go to the Emergency Department.   BELOW ARE SYMPTOMS THAT SHOULD BE REPORTED IMMEDIATELY:  *FEVER GREATER THAN 101.0 F  *CHILLS WITH OR WITHOUT FEVER  NAUSEA AND VOMITING THAT IS NOT CONTROLLED WITH YOUR NAUSEA MEDICATION  *UNUSUAL SHORTNESS OF BREATH  *UNUSUAL BRUISING OR BLEEDING  TENDERNESS IN MOUTH AND THROAT WITH OR WITHOUT PRESENCE OF ULCERS  *URINARY PROBLEMS  *BOWEL PROBLEMS  UNUSUAL RASH Items with * indicate a potential emergency and should be followed up as soon as possible.  One of the nurses will contact you 24 hours after your treatment. Please let the nurse know about any problems that you may have experienced. Feel free to call the clinic you have any questions or concerns. The clinic phone number is [phone]   I have been informed and understand all the instructions given to me. I know to contact the clinic, my physician, or go to the Emergency Department if any problems should occur. I do not have any questions at this time, but understand that I may call the clinic during office hours or the Patient Navigator at [phone] should I have any questions or need assistance in obtaining follow up care.    __________________________________________  _____________  __________ Signature of Patient or Authorized Representative            Date                    Time    __________________________________________ Nurse's Signature

## 2011-07-14 ENCOUNTER — Telehealth: Payer: Self-pay

## 2011-07-14 ENCOUNTER — Ambulatory Visit (HOSPITAL_BASED_OUTPATIENT_CLINIC_OR_DEPARTMENT_OTHER): Payer: BC Managed Care – PPO

## 2011-07-14 VITALS — BP 118/71 | HR 65 | Temp 97.8°F

## 2011-07-14 DIAGNOSIS — C50419 Malignant neoplasm of upper-outer quadrant of unspecified female breast: Secondary | ICD-10-CM

## 2011-07-14 MED ORDER — PEGFILGRASTIM INJECTION 6 MG/0.6ML
6.0000 mg | Freq: Once | SUBCUTANEOUS | Status: AC
  Administered 2011-07-14: 6 mg via SUBCUTANEOUS
  Filled 2011-07-14: qty 0.6

## 2011-07-14 NOTE — Telephone Encounter (Signed)
Message left for pt to contact our office re: chemo f/u. dph

## 2011-07-17 ENCOUNTER — Other Ambulatory Visit: Payer: Self-pay | Admitting: Certified Registered Nurse Anesthetist

## 2011-07-19 ENCOUNTER — Telehealth: Payer: Self-pay | Admitting: *Deleted

## 2011-07-19 NOTE — Telephone Encounter (Signed)
left voice message to inform the patient of the new date and time on 07-20-2011 I instructed the patient that she will see Val the nurse

## 2011-07-20 ENCOUNTER — Ambulatory Visit: Payer: BC Managed Care – PPO | Admitting: Family

## 2011-07-20 ENCOUNTER — Ambulatory Visit: Payer: BC Managed Care – PPO

## 2011-07-20 ENCOUNTER — Other Ambulatory Visit: Payer: BC Managed Care – PPO | Admitting: Lab

## 2011-07-20 ENCOUNTER — Other Ambulatory Visit (HOSPITAL_BASED_OUTPATIENT_CLINIC_OR_DEPARTMENT_OTHER): Payer: BC Managed Care – PPO | Admitting: Lab

## 2011-07-20 DIAGNOSIS — C50419 Malignant neoplasm of upper-outer quadrant of unspecified female breast: Secondary | ICD-10-CM

## 2011-07-20 LAB — CBC WITH DIFFERENTIAL/PLATELET
Basophils Absolute: 0 10*3/uL (ref 0.0–0.1)
Eosinophils Absolute: 0.2 10*3/uL (ref 0.0–0.5)
HGB: 14.5 g/dL (ref 11.6–15.9)
MCV: 96.4 fL (ref 79.5–101.0)
NEUT#: 2 10*3/uL (ref 1.5–6.5)
RDW: 11.7 % (ref 11.2–14.5)
lymph#: 1.1 10*3/uL (ref 0.9–3.3)

## 2011-07-20 LAB — COMPREHENSIVE METABOLIC PANEL
Albumin: 4.2 g/dL (ref 3.5–5.2)
CO2: 28 mEq/L (ref 19–32)
Chloride: 98 mEq/L (ref 96–112)
Glucose, Bld: 80 mg/dL (ref 70–99)
Potassium: 4 mEq/L (ref 3.5–5.3)
Sodium: 136 mEq/L (ref 135–145)
Total Protein: 6.9 g/dL (ref 6.0–8.3)

## 2011-07-21 ENCOUNTER — Ambulatory Visit (INDEPENDENT_AMBULATORY_CARE_PROVIDER_SITE_OTHER): Payer: BC Managed Care – PPO | Admitting: Surgery

## 2011-07-21 ENCOUNTER — Ambulatory Visit: Payer: BC Managed Care – PPO

## 2011-07-21 ENCOUNTER — Encounter (INDEPENDENT_AMBULATORY_CARE_PROVIDER_SITE_OTHER): Payer: Self-pay | Admitting: Surgery

## 2011-07-21 VITALS — BP 110/60 | HR 96 | Resp 20 | Ht 65.0 in | Wt 138.0 lb

## 2011-07-21 DIAGNOSIS — C50419 Malignant neoplasm of upper-outer quadrant of unspecified female breast: Secondary | ICD-10-CM

## 2011-07-21 NOTE — Progress Notes (Signed)
NAME: Stacey Cross                                            DOB: 27-Jan-1971 DATE: 07/21/2011                                                  MRN: [id number]  CC: Post op   HPI: This patient comes in for post op follow-up.Sheunderwent PAC palcement on 3/8. She developed a PTX about two weeks later treated with a chest tube. She now feels well. She feels that she is doing well.  PE: General: The patient appears to be healthy, NAD Left breast still has hard mass upper breast. Incisions from CT OK  DATA REVIEWED: No new data  IMPRESSION: The patient is doing well S/P PAC and started chemo.    PLAN: See half way thru chemo and again before last treatment. Normal activities Plastic surgery consult

## 2011-07-21 NOTE — Patient Instructions (Addendum)
See me again about half way through the chemo and again before your last treatment. Think about seeing a plastic surgery some time during chemo so you will know what options for reconstruction are available  Dr. Odis Luster [phone] or Dr. Kelly Splinter [phone]

## 2011-07-24 ENCOUNTER — Telehealth (INDEPENDENT_AMBULATORY_CARE_PROVIDER_SITE_OTHER): Payer: Self-pay | Admitting: General Surgery

## 2011-07-24 DIAGNOSIS — C50919 Malignant neoplasm of unspecified site of unspecified female breast: Secondary | ICD-10-CM

## 2011-07-24 NOTE — Telephone Encounter (Signed)
Message copied by Liliana Cline on Mon Jul 24, 2011  1:29 PM ------      Message from: [name]      Created: Mon Jul 24, 2011  9:12 AM       Let the patient know that she will not need post mastectomy radiation and would be a candidate for immediate reconstruction. She should see a plastic surgeon sometime during her neo-adjuvant therapy so appropriate plans can be made      ----- Message -----         From: Maryln Gottron, MD         Sent: 07/22/2011   6:38 PM           To: [name], MD, Doneta Public, #            No current pathologic or radiographic indication for post mastectomy XRT. She may proceed with immediate reconstruction. Of note is that there was a recently published retrospective study in our [employer] from [place] from earlier this year suggesting a role for post mastectomy XRT for all  triple negative patients because of of higher local recurrence rate, but this is not widely accepted.  Nadine Counts      ----- Message -----         From: [name], MD         Sent: 07/21/2011   9:06 AM           To: Maryln Gottron, MD            She is going to habe bilateral mastectomy since she is BRCA +. Will she still need radiation? She is considering reconstruction and the plastic surgeon will need to  Know.      Thanks

## 2011-07-26 NOTE — Telephone Encounter (Signed)
Spoke with patient. She does want to see Dr Odis Luster. Referral made. Taken to [place] to schedule.

## 2011-07-26 NOTE — Telephone Encounter (Signed)
Left message for patient to call me back. 

## 2011-07-27 ENCOUNTER — Ambulatory Visit: Payer: BC Managed Care – PPO | Admitting: Oncology

## 2011-07-27 ENCOUNTER — Other Ambulatory Visit: Payer: BC Managed Care – PPO | Admitting: Lab

## 2011-07-27 ENCOUNTER — Ambulatory Visit (HOSPITAL_BASED_OUTPATIENT_CLINIC_OR_DEPARTMENT_OTHER): Payer: BC Managed Care – PPO

## 2011-07-27 ENCOUNTER — Encounter: Payer: Self-pay | Admitting: Genetic Counselor

## 2011-07-27 VITALS — BP 123/80 | HR 73 | Temp 98.4°F | Ht 65.0 in | Wt 140.7 lb

## 2011-07-27 DIAGNOSIS — Z5111 Encounter for antineoplastic chemotherapy: Secondary | ICD-10-CM

## 2011-07-27 DIAGNOSIS — C50419 Malignant neoplasm of upper-outer quadrant of unspecified female breast: Secondary | ICD-10-CM

## 2011-07-27 LAB — CBC WITH DIFFERENTIAL/PLATELET
Eosinophils Absolute: 0 10*3/uL (ref 0.0–0.5)
MONO#: 0.6 10*3/uL (ref 0.1–0.9)
MONO%: 6 % (ref 0.0–14.0)
NEUT#: 7.4 10*3/uL — ABNORMAL HIGH (ref 1.5–6.5)
RBC: 3.91 10*6/uL (ref 3.70–5.45)
RDW: 12 % (ref 11.2–14.5)
WBC: 10.1 10*3/uL (ref 3.9–10.3)

## 2011-07-27 LAB — COMPREHENSIVE METABOLIC PANEL
ALT: 24 U/L (ref 0–35)
Albumin: 4.2 g/dL (ref 3.5–5.2)
Alkaline Phosphatase: 64 U/L (ref 39–117)
CO2: 26 mEq/L (ref 19–32)
Glucose, Bld: 88 mg/dL (ref 70–99)
Potassium: 3.8 mEq/L (ref 3.5–5.3)
Sodium: 140 mEq/L (ref 135–145)
Total Protein: 6.7 g/dL (ref 6.0–8.3)

## 2011-07-27 MED ORDER — DEXAMETHASONE SODIUM PHOSPHATE 4 MG/ML IJ SOLN
12.0000 mg | Freq: Once | INTRAMUSCULAR | Status: AC
  Administered 2011-07-27: 12 mg via INTRAVENOUS

## 2011-07-27 MED ORDER — HEPARIN SOD (PORK) LOCK FLUSH 100 UNIT/ML IV SOLN
500.0000 [IU] | Freq: Once | INTRAVENOUS | Status: DC | PRN
  Filled 2011-07-27: qty 5

## 2011-07-27 MED ORDER — PALONOSETRON HCL INJECTION 0.25 MG/5ML
0.2500 mg | Freq: Once | INTRAVENOUS | Status: AC
  Administered 2011-07-27: 0.25 mg via INTRAVENOUS

## 2011-07-27 MED ORDER — DOXORUBICIN HCL CHEMO IV INJECTION 2 MG/ML
60.0000 mg/m2 | Freq: Once | INTRAVENOUS | Status: AC
  Administered 2011-07-27: 104 mg via INTRAVENOUS
  Filled 2011-07-27: qty 52

## 2011-07-27 MED ORDER — SODIUM CHLORIDE 0.9 % IJ SOLN
10.0000 mL | INTRAMUSCULAR | Status: DC | PRN
  Filled 2011-07-27: qty 10

## 2011-07-27 MED ORDER — CYCLOPHOSPHAMIDE CHEMO INJECTION 1 GM
600.0000 mg/m2 | Freq: Once | INTRAMUSCULAR | Status: AC
  Administered 2011-07-27: 1040 mg via INTRAVENOUS
  Filled 2011-07-27: qty 52

## 2011-07-27 MED ORDER — SODIUM CHLORIDE 0.9 % IV SOLN
150.0000 mg | Freq: Once | INTRAVENOUS | Status: AC
  Administered 2011-07-27: 150 mg via INTRAVENOUS
  Filled 2011-07-27: qty 5

## 2011-07-27 MED ORDER — SODIUM CHLORIDE 0.9 % IV SOLN: Freq: Once | INTRAVENOUS | Status: DC

## 2011-07-28 ENCOUNTER — Ambulatory Visit (HOSPITAL_BASED_OUTPATIENT_CLINIC_OR_DEPARTMENT_OTHER): Payer: BC Managed Care – PPO

## 2011-07-28 VITALS — BP 104/63 | HR 63 | Temp 97.1°F

## 2011-07-28 DIAGNOSIS — C50419 Malignant neoplasm of upper-outer quadrant of unspecified female breast: Secondary | ICD-10-CM

## 2011-07-28 DIAGNOSIS — Z5189 Encounter for other specified aftercare: Secondary | ICD-10-CM

## 2011-07-28 MED ORDER — PEGFILGRASTIM INJECTION 6 MG/0.6ML
6.0000 mg | Freq: Once | SUBCUTANEOUS | Status: AC
  Administered 2011-07-28: 6 mg via SUBCUTANEOUS
  Filled 2011-07-28: qty 0.6

## 2011-08-03 ENCOUNTER — Other Ambulatory Visit (HOSPITAL_BASED_OUTPATIENT_CLINIC_OR_DEPARTMENT_OTHER): Payer: BC Managed Care – PPO | Admitting: Lab

## 2011-08-03 ENCOUNTER — Ambulatory Visit (HOSPITAL_BASED_OUTPATIENT_CLINIC_OR_DEPARTMENT_OTHER): Payer: BC Managed Care – PPO | Admitting: Oncology

## 2011-08-03 ENCOUNTER — Ambulatory Visit: Payer: BC Managed Care – PPO

## 2011-08-03 ENCOUNTER — Encounter: Payer: Self-pay | Admitting: Oncology

## 2011-08-03 VITALS — BP 114/82 | HR 69 | Temp 98.4°F | Ht 65.0 in | Wt 133.2 lb

## 2011-08-03 DIAGNOSIS — B370 Candidal stomatitis: Secondary | ICD-10-CM | POA: Insufficient documentation

## 2011-08-03 DIAGNOSIS — B3781 Candidal esophagitis: Secondary | ICD-10-CM | POA: Insufficient documentation

## 2011-08-03 DIAGNOSIS — C50419 Malignant neoplasm of upper-outer quadrant of unspecified female breast: Secondary | ICD-10-CM

## 2011-08-03 DIAGNOSIS — K121 Other forms of stomatitis: Secondary | ICD-10-CM

## 2011-08-03 DIAGNOSIS — D709 Neutropenia, unspecified: Secondary | ICD-10-CM | POA: Insufficient documentation

## 2011-08-03 LAB — CBC WITH DIFFERENTIAL/PLATELET
BASO%: 0.7 % (ref 0.0–2.0)
EOS%: 0.6 % (ref 0.0–7.0)
Eosinophils Absolute: 0 10*3/uL (ref 0.0–0.5)
MCH: 34 pg (ref 25.1–34.0)
MCHC: 35.5 g/dL (ref 31.5–36.0)
MCV: 95.6 fL (ref 79.5–101.0)
MONO%: 11.5 % (ref 0.0–14.0)
NEUT#: 0.2 10*3/uL — CL (ref 1.5–6.5)
RBC: 3.8 10*6/uL (ref 3.70–5.45)
RDW: 11.8 % (ref 11.2–14.5)
nRBC: 0 % (ref 0–0)

## 2011-08-03 LAB — COMPREHENSIVE METABOLIC PANEL
ALT: 29 U/L (ref 0–35)
AST: 17 U/L (ref 0–37)
Alkaline Phosphatase: 80 U/L (ref 39–117)
Creatinine, Ser: 0.56 mg/dL (ref 0.50–1.10)
Sodium: 137 mEq/L (ref 135–145)
Total Bilirubin: 0.8 mg/dL (ref 0.3–1.2)
Total Protein: 6.7 g/dL (ref 6.0–8.3)

## 2011-08-03 MED ORDER — FLUCONAZOLE 200 MG PO TABS: 200.0000 mg | ORAL_TABLET | Freq: Every day | ORAL | Status: AC

## 2011-08-03 MED ORDER — CIPROFLOXACIN HCL 500 MG PO TABS: 500.0000 mg | ORAL_TABLET | Freq: Two times a day (BID) | ORAL | Status: AC

## 2011-08-03 MED ORDER — NYSTATIN 100000 UNIT/ML MT SUSP: 500000.0000 [IU] | Freq: Four times a day (QID) | OROMUCOSAL | Status: AC

## 2011-08-03 NOTE — Patient Instructions (Addendum)
1. You are neutropenic, practice neutropenic precautions, good handwashing, stay away from crowds and monitor your temperature and call if it higher than 100.5  2. Begin the medications prescribed for you today.  3. Drink plenty of fluids  4. Return in 1 week for next chemotherapy

## 2011-08-03 NOTE — Progress Notes (Signed)
OFFICE PROGRESS NOTE  CC  Stacey Blamer, MD, MD 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 Needle coe biopsy of the left breast revealing a triple negative IDC  2. S/P port placement complicated by pneumothorax  3. Begin Neoadjuvant chemotherapy with AC dose dense cycle #1 on 3/28  CURRENT THERAPY: Here for cycle #2 of AC  INTERVAL HISTORY: Stacey Cross 41 y.o. female returns forFollowup visit prior to her scheduled chemotherapy. She tolerated cycle 1 well. She did develop neutropenia but no fevers with that cycle. Today she feels well no fevers chills night sweats headaches shortness of breath chest pains palpitations no myalgias or arthralgias. Patient does develop yeast infection and she needs to be treated with Diflucan. Periodically. Remainder of the 10 point review of systems is negative.  MEDICAL HISTORY: Past Medical History  Diagnosis Date  . Breast cancer     ALLERGIES:  is allergic to adhesive.  MEDICATIONS:  Current Outpatient Prescriptions  Medication Sig Dispense Refill  . dexamethasone (DECADRON) 4 MG tablet Take 4 mg by mouth 2 (two) times daily with a meal.      . lidocaine-prilocaine (EMLA) cream Apply topically as needed.  30 g  3  . loratadine (CLARITIN) 10 MG tablet Take 10 mg by mouth daily.      Marland Kitchen LORazepam (ATIVAN) 0.5 MG tablet Take 0.5 mg by mouth every 8 (eight) hours.      . Melatonin 1 MG CAPS Take 1 each by mouth.      . NON FORMULARY Smooth Move -Tea      . ondansetron (ZOFRAN) 8 MG tablet Take 8 mg by mouth every 8 (eight) hours as needed.      . prochlorperazine (COMPAZINE) 10 MG tablet Take 10 mg by mouth every 6 (six) hours as needed.      . ciprofloxacin (CIPRO) 500 MG tablet Take 1 tablet (500 mg total) by mouth 2 (two) times daily.  14 tablet  5  . fluconazole (DIFLUCAN) 200 MG tablet Take 1  tablet (200 mg total) by mouth daily.  30 tablet  3  . nystatin (MYCOSTATIN) 100000 UNIT/ML suspension Take 5 mLs (500,000 Units total) by mouth 4 (four) times daily.  60 mL  5    SURGICAL HISTORY:  Past Surgical History  Procedure Date  . Cesarean section 2003  . Dilation and curettage of uterus 2002  . Portacath placement 06/23/2011    Procedure: INSERTION PORT-A-CATH;  Surgeon: [name], MD;  Location: [facility];  Service: General;  Laterality: N/A;  carm     REVIEW OF SYSTEMS:  Pertinent items are noted in HPI.   PHYSICAL EXAMINATION: General appearance: alert, cooperative and appears stated age Lymph nodes: Cervical, supraclavicular, and axillary nodes normal. Resp: clear to auscultation bilaterally and normal percussion bilaterally Back: symmetric, no curvature. ROM normal. No CVA tenderness. Cardio: regular rate and rhythm, S1, S2 normal, no murmur, click, rub or gallop GI: soft, non-tender; bowel sounds normal; no masses,  no organomegaly Extremities: extremities normal, atraumatic, no cyanosis or edema Neurologic: Alert and oriented X 3, normal strength and tone. Normal symmetric reflexes. Normal coordination and gait  ECOG PERFORMANCE STATUS: 0 - Asymptomatic  Blood pressure 123/80, pulse 73, temperature 98.4 F (36.9 C), temperature source Oral, height 5\' 5"  (1.651 m), weight 140 lb 11.2 oz (63.821 kg), last menstrual  period 06/30/2011.  LABORATORY DATA: Lab Results  Component Value Date   WBC 1.0* 08/03/2011   HGB 12.9 08/03/2011   HCT 36.3 08/03/2011   MCV 95.6 08/03/2011   PLT 110* 08/03/2011      Chemistry      Component Value Date/Time   NA 137 08/03/2011 1129   K 4.8 08/03/2011 1129   CL 100 08/03/2011 1129   CO2 28 08/03/2011 1129   BUN 16 08/03/2011 1129   CREATININE 0.56 08/03/2011 1129      Component Value Date/Time   CALCIUM 8.7 08/03/2011 1129   ALKPHOS 80 08/03/2011 1129   AST 17 08/03/2011 1129   ALT 29 08/03/2011 1129   BILITOT  0.8 08/03/2011 1129       RADIOGRAPHIC STUDIES:  ASSESSMENT: 41 year old female with  #1 triple-negative left breast cancer stage IIa.  #2 the patient is receiving dose dense Adriamycin and Cytoxan. She is here for cycle #2. She will get day 2 Neulasta.   PLAN:   #1 we will proceed with her scheduled chemotherapy today.  #2 she will return tomorrow for Neulasta injection.  #3 she will return in one week's time for interim lapse in followup. She knows to call me with any problems questions or concerns.   All questions were answered. The patient knows to call the clinic with any problems, questions or concerns. We can certainly see the patient much sooner if necessary.  I spent 30 minutes counseling the patient face to face. The total time spent in the appointment was 30 minutes.    Drue Second, MD Medical/Oncology [facility] [phone] (beeper) [phone] (Office)  08/03/2011, 7:42 PM

## 2011-08-03 NOTE — Progress Notes (Signed)
OFFICE PROGRESS NOTE  CC  Stacey Blamer, MD, MD [facility] 1 [address] [place] [address] Dr. Chipper Herb Dr. Cyndia Bent  DIAGNOSIS: 41 year old with stage IIA (T2NxMx) invasive ductal carcinoma, triple negative, measuring 3.5 x 1.0 cm diagnosed March 2013  PRIOR THERAPY:  1. Seen at [facility] with self palpated breast mass, needle core biopsy positive for IDC, triple negative, stage IIA  2. Genetic testing revealed BRCA I mutation   3. S/P port placement with complication of pneumothorax  4. Begun on Neoadjuvant chemotherapy on 3/28 with AC dose dense. A total of 4 cycles is planned.  CURRENT THERAPY: interim labs from cycle 2 given on 07/27/11  INTERVAL HISTORY: Stacey Cross 41 y.o. female returns for follow up visit. She has diarrhea, mouth sores, and fatigue, she also has developed vaginal yeast infection and thrush. She experienced a little bit of nausea but no vomiting. She still continues to work and exercise but she does feel worn out quickly.She has not had any fevers or chills or night sweats. Remainder of the 10 point review of systems is negative.  MEDICAL HISTORY: Past Medical History  Diagnosis Date  . Breast cancer     ALLERGIES:  is allergic to adhesive.  MEDICATIONS:  Current Outpatient Prescriptions  Medication Sig Dispense Refill  . ciprofloxacin (CIPRO) 500 MG tablet Take 1 tablet (500 mg total) by mouth 2 (two) times daily.  14 tablet  5  . dexamethasone (DECADRON) 4 MG tablet Take 4 mg by mouth 2 (two) times daily with a meal.      . fluconazole (DIFLUCAN) 200 MG tablet Take 1 tablet (200 mg total) by mouth daily.  30 tablet  3  . lidocaine-prilocaine (EMLA) cream Apply topically as needed.  30 g  3  . loratadine (CLARITIN) 10 MG tablet Take 10 mg by mouth daily.      Marland Kitchen LORazepam (ATIVAN) 0.5 MG tablet Take 0.5 mg by mouth every 8 (eight) hours.      . Melatonin 1 MG CAPS Take 1 each by mouth.      . NON  FORMULARY Smooth Move -Tea      . nystatin (MYCOSTATIN) 100000 UNIT/ML suspension Take 5 mLs (500,000 Units total) by mouth 4 (four) times daily.  60 mL  5  . ondansetron (ZOFRAN) 8 MG tablet Take 8 mg by mouth every 8 (eight) hours as needed.      . prochlorperazine (COMPAZINE) 10 MG tablet Take 10 mg by mouth every 6 (six) hours as needed.        SURGICAL HISTORY:  Past Surgical History  Procedure Date  . Cesarean section 2003  . Dilation and curettage of uterus 2002  . Portacath placement 06/23/2011    Procedure: INSERTION PORT-A-CATH;  Surgeon: [name], MD;  Location: [facility];  Service: General;  Laterality: N/A;  carm     REVIEW OF SYSTEMS:  Pertinent items are noted in HPI.   PHYSICAL EXAMINATION: General appearance: alert, cooperative, appears stated age, fatigued, flushed and slowed mentation Neck: no adenopathy, no carotid bruit, no JVD, supple, symmetrical, trachea midline and thyroid not enlarged, symmetric, no tenderness/mass/nodules Lymph nodes: Cervical, supraclavicular, and axillary nodes normal. Resp: clear to auscultation bilaterally and normal percussion bilaterally Back: symmetric, no curvature. ROM normal. No CVA tenderness. Cardio: regular rate and rhythm, S1, S2 normal, no murmur, click, rub or gallop GI: soft, non-tender; bowel sounds normal; no masses,  no organomegaly Extremities:  extremities normal, atraumatic, no cyanosis or edema Neurologic: Alert and oriented X 3, normal strength and tone. Normal symmetric reflexes. Normal coordination and gait  ECOG PERFORMANCE STATUS: 1 - Symptomatic but completely ambulatory  Blood pressure 114/82, pulse 69, temperature 98.4 F (36.9 C), temperature source Oral, height 5\' 5"  (1.651 m), weight 133 lb 3.2 oz (60.419 kg).  LABORATORY DATA: Lab Results  Component Value Date   WBC 1.0* 08/03/2011   HGB 12.9 08/03/2011   HCT 36.3 08/03/2011   MCV 95.6 08/03/2011   PLT 110* 08/03/2011       Chemistry      Component Value Date/Time   NA 140 07/27/2011 1003   K 3.8 07/27/2011 1003   CL 108 07/27/2011 1003   CO2 26 07/27/2011 1003   BUN 10 07/27/2011 1003   CREATININE 0.59 07/27/2011 1003      Component Value Date/Time   CALCIUM 8.8 07/27/2011 1003   ALKPHOS 64 07/27/2011 1003   AST 21 07/27/2011 1003   ALT 24 07/27/2011 1003   BILITOT 0.3 07/27/2011 1003       RADIOGRAPHIC STUDIES:  Dg Chest 2 View  07/07/2011  *RADIOLOGY REPORT*  Clinical Data: Pneumothorax, follow-up  CHEST - 2 VIEW  Comparison: Chest x-ray of 07/07/2011  Findings: There is little change in the tiny right apical pneumothorax.  Right chest wall subcutaneous air remains. Linear atelectasis at the right lung base is stable.  No infiltrate or effusion is seen.  Heart size is stable.  Right-sided Port-A-Cath are unchanged with the tip in the mid upper SVC.  IMPRESSION: No change in very tiny right apical pneumothorax.  Original Report Authenticated By: Juline Patch, M.D.   Dg Chest 2 View  07/07/2011  *RADIOLOGY REPORT*  Clinical Data: Status post Port-A-Cath placement 1 week ago resulting in right pneumothorax.  History of breast cancer.  CHEST - 2 VIEW  Comparison: Plain film chest 07/06/2008 at 5:04 a.m.  Findings: Right Port-A-Cath is identified and unchanged.  Right chest tube has been removed.  The patient has a tiny residual right apical pneumothorax estimated at 5% or less.  Left lung is clear. No pleural effusion.  Heart size is normal.  Subcutaneous air along the right chest wall noted.  IMPRESSION: Tiny residual right pneumothorax after chest tube removal.  Original Report Authenticated By: Bernadene Bell. Maricela Curet, M.D.   Dg Chest Port 1 View  07/07/2011  *RADIOLOGY REPORT*  Clinical Data: Follow up right pneumothorax  PORTABLE CHEST - 1 VIEW  Comparison: Portable chest x-ray of 07/06/2011  Findings: A right chest tube remains.  No definite pneumothorax is seen.  Right-sided Port-A-Cath is unchanged with the tip in  the upper SVC.  The left lung is clear.  Heart size is stable.  IMPRESSION: Stable chest x-ray.  No pneumothorax.  Right chest tube remains.  Original Report Authenticated By: Juline Patch, M.D.   Dg Chest Port 1 View  07/06/2011  *RADIOLOGY REPORT*  Clinical Data: Follow up pneumothorax  PORTABLE CHEST - 1 VIEW  Comparison: 07/05/2011  Findings: Stable right chest tube.  No pneumothorax is seen  Mild linear atelectasis in the right perihilar region.  The left lung is clear.  No pleural effusion.  Stable right chest port.  IMPRESSION: Stable right chest tube.  No pneumothorax is seen.  Original Report Authenticated By: Charline Bills, M.D.   Dg Chest Port 1 View  07/05/2011  *RADIOLOGY REPORT*  Clinical Data: Right pneumothorax, follow-up  PORTABLE CHEST - 1 VIEW  Comparison: Portable chest x-ray of 07/04/2011  Findings: No pneumothorax is seen.  Right chest tube is present. The left lung is clear.  Mild cardiomegaly is stable.  Right-sided Port-A-Cath remains with the tip in the mid upper SVC.  IMPRESSION: Right chest tube remains.  No definite pneumothorax.  Original Report Authenticated By: Juline Patch, M.D.    ASSESSMENT: 41 year old female with  #1 stage II A. 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 She is currently being treated with neoadjuvant chemotherapy consisting of Adriamycin and Cytoxan. She has completed 2 cycles. She is here for interim labs today.  #2 mucositis and thrush.  #3 vaginal yeast infection.  #4 diarrhea.  #5 neutropenia   PLAN:   #1 patient seems to be tolerating her chemotherapy well except for the obvious expected side effects. I examined her breasts today and to me the left mastitis feels smaller and softer. She is very pleased with this finding. She also feels herself that it has gotten softer.  #2 I have gone ahead and given her instructions to do by 18 mouth rinses. She will also do nystatin swish and swallow. She was given  prescriptions for Diflucan.  #3 patient is severely neutropenic have given her a prescription for Cipro 500 mg twice a day.  #4 patient does have diarrhea and she is instructed to drink plenty of fluids as well as to take Imodium as needed.   All questions were answered. The patient knows to call the clinic with any problems, questions or concerns. We can certainly see the patient much sooner if necessary.  I spent 30 minutes counseling the patient face to face. The total time spent in the appointment was 30 minutes.    Drue Second, MD Medical/Oncology [facility] [phone] (beeper) [phone] (Office)  08/03/2011, 12:44 PM

## 2011-08-04 ENCOUNTER — Ambulatory Visit: Payer: BC Managed Care – PPO

## 2011-08-10 ENCOUNTER — Ambulatory Visit (HOSPITAL_BASED_OUTPATIENT_CLINIC_OR_DEPARTMENT_OTHER): Payer: BC Managed Care – PPO

## 2011-08-10 ENCOUNTER — Ambulatory Visit (HOSPITAL_BASED_OUTPATIENT_CLINIC_OR_DEPARTMENT_OTHER): Payer: BC Managed Care – PPO | Admitting: Oncology

## 2011-08-10 ENCOUNTER — Encounter: Payer: Self-pay | Admitting: Oncology

## 2011-08-10 ENCOUNTER — Other Ambulatory Visit: Payer: Self-pay | Admitting: Oncology

## 2011-08-10 ENCOUNTER — Other Ambulatory Visit (HOSPITAL_BASED_OUTPATIENT_CLINIC_OR_DEPARTMENT_OTHER): Payer: BC Managed Care – PPO | Admitting: Lab

## 2011-08-10 VITALS — BP 124/84 | HR 74 | Temp 98.4°F | Ht 65.0 in | Wt 138.2 lb

## 2011-08-10 DIAGNOSIS — C50419 Malignant neoplasm of upper-outer quadrant of unspecified female breast: Secondary | ICD-10-CM

## 2011-08-10 DIAGNOSIS — Z5111 Encounter for antineoplastic chemotherapy: Secondary | ICD-10-CM

## 2011-08-10 LAB — COMPREHENSIVE METABOLIC PANEL
ALT: 25 U/L (ref 0–35)
AST: 21 U/L (ref 0–37)
BUN: 16 mg/dL (ref 6–23)
CO2: 26 mEq/L (ref 19–32)
Calcium: 9 mg/dL (ref 8.4–10.5)
Chloride: 105 mEq/L (ref 96–112)
Creatinine, Ser: 0.67 mg/dL (ref 0.50–1.10)
Total Bilirubin: 0.3 mg/dL (ref 0.3–1.2)

## 2011-08-10 LAB — CBC WITH DIFFERENTIAL/PLATELET
BASO%: 0.3 % (ref 0.0–2.0)
HCT: 34.3 % — ABNORMAL LOW (ref 34.8–46.6)
LYMPH%: 17.2 % (ref 14.0–49.7)
MCHC: 36.4 g/dL — ABNORMAL HIGH (ref 31.5–36.0)
MCV: 91.2 fL (ref 79.5–101.0)
MONO%: 6.5 % (ref 0.0–14.0)
NEUT%: 76 % (ref 38.4–76.8)
Platelets: 186 10*3/uL (ref 145–400)
RBC: 3.76 10*6/uL (ref 3.70–5.45)
nRBC: 0 % (ref 0–0)

## 2011-08-10 MED ORDER — DOXORUBICIN HCL CHEMO IV INJECTION 2 MG/ML
60.0000 mg/m2 | Freq: Once | INTRAVENOUS | Status: AC
  Administered 2011-08-10: 104 mg via INTRAVENOUS
  Filled 2011-08-10: qty 52

## 2011-08-10 MED ORDER — PALONOSETRON HCL INJECTION 0.25 MG/5ML
0.2500 mg | Freq: Once | INTRAVENOUS | Status: AC
  Administered 2011-08-10: 0.25 mg via INTRAVENOUS

## 2011-08-10 MED ORDER — CYCLOPHOSPHAMIDE CHEMO INJECTION 1 GM
600.0000 mg/m2 | Freq: Once | INTRAMUSCULAR | Status: AC
  Administered 2011-08-10: 1040 mg via INTRAVENOUS
  Filled 2011-08-10: qty 52

## 2011-08-10 MED ORDER — SODIUM CHLORIDE 0.9 % IV SOLN
Freq: Once | INTRAVENOUS | Status: AC
  Administered 2011-08-10: 14:00:00 via INTRAVENOUS

## 2011-08-10 MED ORDER — SODIUM CHLORIDE 0.9 % IJ SOLN
10.0000 mL | INTRAMUSCULAR | Status: DC | PRN
  Administered 2011-08-10: 10 mL
  Filled 2011-08-10: qty 10

## 2011-08-10 MED ORDER — DEXAMETHASONE SODIUM PHOSPHATE 4 MG/ML IJ SOLN
12.0000 mg | Freq: Once | INTRAMUSCULAR | Status: AC
  Administered 2011-08-10: 12 mg via INTRAVENOUS

## 2011-08-10 MED ORDER — HEPARIN SOD (PORK) LOCK FLUSH 100 UNIT/ML IV SOLN
500.0000 [IU] | Freq: Once | INTRAVENOUS | Status: AC | PRN
  Administered 2011-08-10: 500 [IU]
  Filled 2011-08-10: qty 5

## 2011-08-10 MED ORDER — SODIUM CHLORIDE 0.9 % IV SOLN
150.0000 mg | Freq: Once | INTRAVENOUS | Status: AC
  Administered 2011-08-10: 150 mg via INTRAVENOUS
  Filled 2011-08-10: qty 5

## 2011-08-10 NOTE — Patient Instructions (Signed)
1. Proceed with chemotherapy today  2. Return in 1 week for follow up  3. Call with any problems

## 2011-08-10 NOTE — Progress Notes (Signed)
OFFICE PROGRESS NOTE  CC  [name], MD, MD [facility] 1 [address] [place] [address] Dr. Chipper Herb Dr. Cyndia Bent  DIAGNOSIS: 41 year old with stage IIA (T2NxMx) invasive ductal carcinoma, triple negative, measuring 3.5 x 1.0 cm diagnosed March 2013  PRIOR THERAPY:  1. Seen at [facility] with self palpated breast mass, needle core biopsy positive for IDC, triple negative, stage IIA  2. Genetic testing revealed BRCA I mutation   3. S/P port placement with complication of pneumothorax  4. Begun on Neoadjuvant chemotherapy on 3/28 with AC dose dense. A total of 4 cycles is planned.  CURRENT THERAPY: Here for  cycle 3  INTERVAL HISTORY: Stacey Cross 41 y.o. female returns for follow up visit.Clinically she is doing well her blood counts are back up to normal. She is scheduled for cycle #3 today of her chemotherapy. Her vaginal yeast infection has resolved. Her neutropenia has resolved. She overall feels well she's been eating quite nicely and has been able to keep herself well hydrated. She denies any headaches double vision blurring of vision shortness of breath chest pains palpitations she has no myalgias or arthralgias. Remainder of the 10 point review of systems is negative. MEDICAL HISTORY: Past Medical History  Diagnosis Date  . Breast cancer     ALLERGIES:  is allergic to adhesive.  MEDICATIONS:  Current Outpatient Prescriptions  Medication Sig Dispense Refill  . dexamethasone (DECADRON) 4 MG tablet Take 4 mg by mouth 2 (two) times daily with a meal.      . fluconazole (DIFLUCAN) 200 MG tablet Take 1 tablet (200 mg total) by mouth daily.  30 tablet  3  . lidocaine-prilocaine (EMLA) cream Apply topically as needed.  30 g  3  . loratadine (CLARITIN) 10 MG tablet Take 10 mg by mouth daily.      Marland Kitchen LORazepam (ATIVAN) 0.5 MG tablet Take 0.5 mg by mouth every 8 (eight) hours.      . Melatonin 1 MG CAPS Take 1 each by mouth.       . NON FORMULARY Smooth Move -Tea      . nystatin (MYCOSTATIN) 100000 UNIT/ML suspension Take 5 mLs (500,000 Units total) by mouth 4 (four) times daily.  60 mL  5  . ondansetron (ZOFRAN) 8 MG tablet Take 8 mg by mouth every 8 (eight) hours as needed.      . prochlorperazine (COMPAZINE) 10 MG tablet Take 10 mg by mouth every 6 (six) hours as needed.      . ciprofloxacin (CIPRO) 500 MG tablet Take 1 tablet (500 mg total) by mouth 2 (two) times daily.  14 tablet  5    SURGICAL HISTORY:  Past Surgical History  Procedure Date  . Cesarean section 2003  . Dilation and curettage of uterus 2002  . Portacath placement 06/23/2011    Procedure: INSERTION PORT-A-CATH;  Surgeon: [name], MD;  Location: [facility];  Service: General;  Laterality: N/A;  carm     REVIEW OF SYSTEMS:  Pertinent items are noted in HPI.   PHYSICAL EXAMINATION: General appearance: alert, cooperative, appears stated age, fatigued, flushed and slowed mentation Neck: no adenopathy, no carotid bruit, no JVD, supple, symmetrical, trachea midline and thyroid not enlarged, symmetric, no tenderness/mass/nodules Lymph nodes: Cervical, supraclavicular, and axillary nodes normal. Resp: clear to auscultation bilaterally and normal percussion bilaterally Back: symmetric, no curvature. ROM normal. No CVA tenderness. Cardio: regular rate and rhythm, S1, S2 normal, no  murmur, click, rub or gallop GI: soft, non-tender; bowel sounds normal; no masses,  no organomegaly Extremities: extremities normal, atraumatic, no cyanosis or edema Neurologic: Alert and oriented X 3, normal strength and tone. Normal symmetric reflexes. Normal coordination and gait  ECOG PERFORMANCE STATUS: 1 - Symptomatic but completely ambulatory  Blood pressure 124/84, pulse 74, temperature 98.4 F (36.9 C), temperature source Oral, height 5\' 5"  (1.651 m), weight 138 lb 3.2 oz (62.687 kg).  LABORATORY DATA: Lab Results  Component Value Date     WBC 11.8* 08/10/2011   HGB 12.5 08/10/2011   HCT 34.3* 08/10/2011   MCV 91.2 08/10/2011   PLT 186 08/10/2011      Chemistry      Component Value Date/Time   NA 137 08/03/2011 1129   K 4.8 08/03/2011 1129   CL 100 08/03/2011 1129   CO2 28 08/03/2011 1129   BUN 16 08/03/2011 1129   CREATININE 0.56 08/03/2011 1129      Component Value Date/Time   CALCIUM 8.7 08/03/2011 1129   ALKPHOS 80 08/03/2011 1129   AST 17 08/03/2011 1129   ALT 29 08/03/2011 1129   BILITOT 0.8 08/03/2011 1129       RADIOGRAPHIC STUDIES:  ASSESSMENT: 41 year old female with  #1 stage II A. 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 She is currently being treated with neoadjuvant chemotherapy consisting of Adriamycin and Cytoxan. She has completed 2 cycles. She is here for Cycle #3 of her scheduled Adriamycin and Cytoxan  #2 once she finishes up 4 cycles of a.c. She will then go on to receive Taxol and carboplatinum on a weekly basis for a total of 12 weeks.Marland Kitchen   PLAN:  #1 patient will proceed with her scheduled chemotherapy today her counts looked terrific.  #2 she will return in one week's time for interim labs and followup with me.  #3 she knows to begin her Diflucan if she starts to notice any kind of yeast infections. She will also be treated with Cipro twice a day 500 mg should she develop neutropenia again.  All questions were answered. The patient knows to call the clinic with any problems, questions or concerns. We can certainly see the patient much sooner if necessary.  I spent 30 minutes counseling the patient face to face. The total time spent in the appointment was 30 minutes.    Drue Second, MD Medical/Oncology [facility] [phone] (beeper) [phone] (Office)  08/10/2011, 1:19 PM

## 2011-08-11 ENCOUNTER — Ambulatory Visit (HOSPITAL_BASED_OUTPATIENT_CLINIC_OR_DEPARTMENT_OTHER): Payer: BC Managed Care – PPO

## 2011-08-11 VITALS — BP 102/62 | HR 71 | Temp 96.9°F

## 2011-08-11 DIAGNOSIS — Z5189 Encounter for other specified aftercare: Secondary | ICD-10-CM

## 2011-08-11 DIAGNOSIS — C50419 Malignant neoplasm of upper-outer quadrant of unspecified female breast: Secondary | ICD-10-CM

## 2011-08-11 MED ORDER — PEGFILGRASTIM INJECTION 6 MG/0.6ML
6.0000 mg | Freq: Once | SUBCUTANEOUS | Status: AC
  Administered 2011-08-11: 6 mg via SUBCUTANEOUS
  Filled 2011-08-11: qty 0.6

## 2011-08-17 ENCOUNTER — Telehealth: Payer: Self-pay | Admitting: Oncology

## 2011-08-17 ENCOUNTER — Other Ambulatory Visit (HOSPITAL_BASED_OUTPATIENT_CLINIC_OR_DEPARTMENT_OTHER): Payer: BC Managed Care – PPO | Admitting: Lab

## 2011-08-17 ENCOUNTER — Ambulatory Visit (HOSPITAL_BASED_OUTPATIENT_CLINIC_OR_DEPARTMENT_OTHER): Payer: BC Managed Care – PPO | Admitting: Oncology

## 2011-08-17 ENCOUNTER — Ambulatory Visit: Payer: BC Managed Care – PPO

## 2011-08-17 ENCOUNTER — Encounter: Payer: Self-pay | Admitting: Oncology

## 2011-08-17 VITALS — BP 99/66 | HR 79 | Temp 98.1°F | Ht 65.0 in | Wt 136.2 lb

## 2011-08-17 DIAGNOSIS — C50419 Malignant neoplasm of upper-outer quadrant of unspecified female breast: Secondary | ICD-10-CM

## 2011-08-17 DIAGNOSIS — Z1509 Genetic susceptibility to other malignant neoplasm: Secondary | ICD-10-CM

## 2011-08-17 DIAGNOSIS — Z1501 Genetic susceptibility to malignant neoplasm of breast: Secondary | ICD-10-CM

## 2011-08-17 HISTORY — DX: Genetic susceptibility to malignant neoplasm of breast: Z15.01

## 2011-08-17 HISTORY — DX: Genetic susceptibility to malignant neoplasm of breast: Z15.09

## 2011-08-17 LAB — COMPREHENSIVE METABOLIC PANEL
ALT: 23 U/L (ref 0–35)
AST: 13 U/L (ref 0–37)
Albumin: 4.2 g/dL (ref 3.5–5.2)
CO2: 33 mEq/L — ABNORMAL HIGH (ref 19–32)
Calcium: 9.2 mg/dL (ref 8.4–10.5)
Chloride: 102 mEq/L (ref 96–112)
Creatinine, Ser: 0.63 mg/dL (ref 0.50–1.10)
Potassium: 3.9 mEq/L (ref 3.5–5.3)
Sodium: 140 mEq/L (ref 135–145)
Total Protein: 6.4 g/dL (ref 6.0–8.3)

## 2011-08-17 LAB — CBC WITH DIFFERENTIAL/PLATELET
BASO%: 0.8 % (ref 0.0–2.0)
Eosinophils Absolute: 0 10*3/uL (ref 0.0–0.5)
LYMPH%: 41.5 % (ref 14.0–49.7)
MONO#: 0.1 10*3/uL (ref 0.1–0.9)
NEUT#: 0.7 10*3/uL — ABNORMAL LOW (ref 1.5–6.5)
Platelets: 127 10*3/uL — ABNORMAL LOW (ref 145–400)
RBC: 3.34 10*6/uL — ABNORMAL LOW (ref 3.70–5.45)
WBC: 1.4 10*3/uL — ABNORMAL LOW (ref 3.9–10.3)
lymph#: 0.6 10*3/uL — ABNORMAL LOW (ref 0.9–3.3)
nRBC: 0 % (ref 0–0)

## 2011-08-17 NOTE — Telephone Encounter (Signed)
gve the pt her may 2013 appt calendar 

## 2011-08-17 NOTE — Progress Notes (Signed)
OFFICE PROGRESS NOTE  CC  Stacey Blamer, MD, MD [facility] 1 [address] [place] [address] Dr. Chipper Herb Dr. Cyndia Bent  DIAGNOSIS: 41 year old with stage IIA (T2NxMx) invasive ductal carcinoma, triple negative, measuring 3.5 x 1.0 cm diagnosed March 2013  PRIOR THERAPY:  1. Seen at [facility] with self palpated breast mass, needle core biopsy positive for IDC, triple negative, stage IIA  2. Genetic testing revealed BRCA I mutation   3. S/P port placement with complication of pneumothorax  4. Begun on Neoadjuvant chemotherapy on 07/13/11 with AC dose dense. A total of 4 cycles of AC is planned. She will receive her cycle on 08/24/11  CURRENT THERAPY: Here for interim labs  INTERVAL HISTORY: Stacey Cross 41 y.o. female returns for follow up visit.She seems to have tolerated this cycle of treatment well. Her counts are not as low as expected and have been in the past. She does feel weak and tired. She is experiencing some achiness in her legs and in her fingers. She has not been able to exercise as she has been doing in the past. I do think that she is having cumulative effect of her chemotherapy. She did not experience any excessive nausea or vomiting but she did take her antiemetics as she has been prescribed. She did start experiencing some vaginal itching so she went ahead and took Diflucan 200 mg for about 2 days and all of the itching has subsided. Today she will begin antibiotic prophylactically Cipro 500 mg twice a day because she is neutropenic today. She however has not had any fevers chills night sweats. She denies any headaches double vision blurring of vision no difficulty in swallowing she did do some nystatin swish and swallow for just a little while after her chemotherapy. She has not had a cough hemoptysis hematemesis no easy bruising or bleeding. Remainder of the 10 point review of systems is negative.  MEDICAL HISTORY: Past  Medical History  Diagnosis Date  . Breast cancer   . BRCA1 positive 08/17/2011    ALLERGIES:  is allergic to adhesive.  MEDICATIONS:  Current Outpatient Prescriptions  Medication Sig Dispense Refill  . dexamethasone (DECADRON) 4 MG tablet Take 4 mg by mouth 2 (two) times daily with a meal.      . lidocaine-prilocaine (EMLA) cream Apply topically as needed.  30 g  3  . loratadine (CLARITIN) 10 MG tablet Take 10 mg by mouth daily.      Marland Kitchen LORazepam (ATIVAN) 0.5 MG tablet Take 0.5 mg by mouth every 8 (eight) hours.      . Melatonin 1 MG CAPS Take 1 each by mouth.      . NON FORMULARY Smooth Move -Tea      . ondansetron (ZOFRAN) 8 MG tablet Take 8 mg by mouth every 8 (eight) hours as needed.      . prochlorperazine (COMPAZINE) 10 MG tablet Take 10 mg by mouth every 6 (six) hours as needed.        SURGICAL HISTORY:  Past Surgical History  Procedure Date  . Cesarean section 2003  . Dilation and curettage of uterus 2002  . Portacath placement 06/23/2011    Procedure: INSERTION PORT-A-CATH;  Surgeon: [name], MD;  Location: [facility];  Service: General;  Laterality: N/A;  carm     REVIEW OF SYSTEMS:  Pertinent items are noted in HPI.   PHYSICAL EXAMINATION: General appearance: alert, cooperative, appears stated age, fatigued,  flushed and slowed mentation Neck: no adenopathy, no carotid bruit, no JVD, supple, symmetrical, trachea midline and thyroid not enlarged, symmetric, no tenderness/mass/nodules Lymph nodes: Cervical, supraclavicular, and axillary nodes normal. Resp: clear to auscultation bilaterally and normal percussion bilaterally Back: symmetric, no curvature. ROM normal. No CVA tenderness. Cardio: regular rate and rhythm, S1, S2 normal, no murmur, click, rub or gallop GI: soft, non-tender; bowel sounds normal; no masses,  no organomegaly Extremities: extremities normal, atraumatic, no cyanosis or edema Neurologic: Alert and oriented X 3, normal strength  and tone. Normal symmetric reflexes. Normal coordination and gait  ECOG PERFORMANCE STATUS: 1 - Symptomatic but completely ambulatory  Blood pressure 99/66, pulse 79, temperature 98.1 F (36.7 C), height 5\' 5"  (1.651 m), weight 136 lb 3.2 oz (61.78 kg).  LABORATORY DATA: Lab Results  Component Value Date   WBC 1.4* 08/17/2011   HGB 11.3* 08/17/2011   HCT 31.8* 08/17/2011   MCV 95.3 08/17/2011   PLT 127* 08/17/2011      Chemistry      Component Value Date/Time   NA 140 08/10/2011 1122   K 3.6 08/10/2011 1122   CL 105 08/10/2011 1122   CO2 26 08/10/2011 1122   BUN 16 08/10/2011 1122   CREATININE 0.67 08/10/2011 1122      Component Value Date/Time   CALCIUM 9.0 08/10/2011 1122   ALKPHOS 69 08/10/2011 1122   AST 21 08/10/2011 1122   ALT 25 08/10/2011 1122   BILITOT 0.3 08/10/2011 1122       RADIOGRAPHIC STUDIES:  ASSESSMENT: 41 year old female with  #1 stage II A. 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 She is currently being treated with neoadjuvant chemotherapy consisting of Adriamycin and Cytoxan. She has completed 2 cycles. She is s/p Cycle #3 of her scheduled Adriamycin and Cytoxan. Here for interim counts.  #2. BRCA1 Mutation Carrier  #3 once she finishes up 4 cycles of a.c. She will then go on to receive Taxol and carboplatinum on a weekly basis for a total of 12 weeks.Marland Kitchen   PLAN:  #1 overall patient is doing well and tolerated the Adriamycin and Cytoxan well. She knows to start Cipro 500 mg twice a day monitor her temperatures and practice neutropenic precautions.  #2 she will return in one week's time for cycle #4 of Adriamycin and Cytoxan. Once she has completed this then we will plan on starting her on Taxol and carboplatinum beginning on 09/07/2011.  All questions were answered. The patient knows to call the clinic with any problems, questions or concerns. We can certainly see the patient much sooner if necessary.  I spent 30 minutes counseling the  patient face to face. The total time spent in the appointment was 30 minutes.    Drue Second, MD Medical/Oncology [facility] [phone] (beeper) [phone] (Office)  08/17/2011, 11:59 AM

## 2011-08-17 NOTE — Patient Instructions (Signed)
1. You are doing well.   2. Neutropenic precautions  3. Begin cipro 500 mg BID  4. Call if you develop fevers  5. See you in 1 week for cycle 4 of AC

## 2011-08-18 ENCOUNTER — Ambulatory Visit: Payer: BC Managed Care – PPO

## 2011-08-23 ENCOUNTER — Other Ambulatory Visit: Payer: Self-pay | Admitting: *Deleted

## 2011-08-24 ENCOUNTER — Ambulatory Visit: Payer: BC Managed Care – PPO | Admitting: Family

## 2011-08-24 ENCOUNTER — Ambulatory Visit (HOSPITAL_BASED_OUTPATIENT_CLINIC_OR_DEPARTMENT_OTHER): Payer: BC Managed Care – PPO

## 2011-08-24 ENCOUNTER — Ambulatory Visit (HOSPITAL_BASED_OUTPATIENT_CLINIC_OR_DEPARTMENT_OTHER): Payer: BC Managed Care – PPO | Admitting: Oncology

## 2011-08-24 ENCOUNTER — Other Ambulatory Visit (HOSPITAL_BASED_OUTPATIENT_CLINIC_OR_DEPARTMENT_OTHER): Payer: BC Managed Care – PPO | Admitting: Lab

## 2011-08-24 ENCOUNTER — Encounter: Payer: Self-pay | Admitting: Oncology

## 2011-08-24 VITALS — BP 117/73 | HR 77 | Temp 98.7°F | Ht 65.0 in | Wt 139.1 lb

## 2011-08-24 DIAGNOSIS — C50419 Malignant neoplasm of upper-outer quadrant of unspecified female breast: Secondary | ICD-10-CM

## 2011-08-24 DIAGNOSIS — Z5111 Encounter for antineoplastic chemotherapy: Secondary | ICD-10-CM

## 2011-08-24 LAB — COMPREHENSIVE METABOLIC PANEL
ALT: 30 U/L (ref 0–35)
AST: 25 U/L (ref 0–37)
Albumin: 4.1 g/dL (ref 3.5–5.2)
Alkaline Phosphatase: 80 U/L (ref 39–117)
BUN: 9 mg/dL (ref 6–23)
Calcium: 8.5 mg/dL (ref 8.4–10.5)
Chloride: 105 mEq/L (ref 96–112)
Creatinine, Ser: 0.58 mg/dL (ref 0.50–1.10)
Potassium: 3.8 mEq/L (ref 3.5–5.3)

## 2011-08-24 LAB — CBC WITH DIFFERENTIAL/PLATELET
BASO%: 0.1 % (ref 0.0–2.0)
EOS%: 0 % (ref 0.0–7.0)
MCH: 33.1 pg (ref 25.1–34.0)
MCHC: 35.5 g/dL (ref 31.5–36.0)
MONO#: 0.8 10*3/uL (ref 0.1–0.9)
RBC: 3.29 10*6/uL — ABNORMAL LOW (ref 3.70–5.45)
RDW: 14.2 % (ref 11.2–14.5)
WBC: 14.6 10*3/uL — ABNORMAL HIGH (ref 3.9–10.3)
lymph#: 1.5 10*3/uL (ref 0.9–3.3)
nRBC: 0 % (ref 0–0)

## 2011-08-24 MED ORDER — DEXAMETHASONE SODIUM PHOSPHATE 4 MG/ML IJ SOLN
12.0000 mg | Freq: Once | INTRAMUSCULAR | Status: AC
  Administered 2011-08-24: 12 mg via INTRAVENOUS

## 2011-08-24 MED ORDER — SODIUM CHLORIDE 0.9 % IJ SOLN
10.0000 mL | INTRAMUSCULAR | Status: DC | PRN
  Administered 2011-08-24: 10 mL
  Filled 2011-08-24: qty 10

## 2011-08-24 MED ORDER — CYCLOPHOSPHAMIDE CHEMO INJECTION 1 GM
600.0000 mg/m2 | Freq: Once | INTRAMUSCULAR | Status: AC
  Administered 2011-08-24: 1040 mg via INTRAVENOUS
  Filled 2011-08-24: qty 52

## 2011-08-24 MED ORDER — PALONOSETRON HCL INJECTION 0.25 MG/5ML: 0.2500 mg | Freq: Once | INTRAVENOUS | Status: DC

## 2011-08-24 MED ORDER — HEPARIN SOD (PORK) LOCK FLUSH 100 UNIT/ML IV SOLN
500.0000 [IU] | Freq: Once | INTRAVENOUS | Status: AC | PRN
  Administered 2011-08-24: 500 [IU]
  Filled 2011-08-24: qty 5

## 2011-08-24 MED ORDER — SODIUM CHLORIDE 0.9 % IV SOLN
Freq: Once | INTRAVENOUS | Status: AC
  Administered 2011-08-24: 13:00:00 via INTRAVENOUS

## 2011-08-24 MED ORDER — DOXORUBICIN HCL CHEMO IV INJECTION 2 MG/ML
60.0000 mg/m2 | Freq: Once | INTRAVENOUS | Status: AC
  Administered 2011-08-24: 104 mg via INTRAVENOUS
  Filled 2011-08-24: qty 52

## 2011-08-24 MED ORDER — SODIUM CHLORIDE 0.9 % IV SOLN
150.0000 mg | Freq: Once | INTRAVENOUS | Status: AC
  Administered 2011-08-24: 150 mg via INTRAVENOUS
  Filled 2011-08-24: qty 5

## 2011-08-24 NOTE — Patient Instructions (Signed)
[facility] Discharge Instructions for Patients Receiving Chemotherapy  Today you received the following chemotherapy agents Doxorubicin and Cytoxan  To help prevent nausea and vomiting after your treatment, we encourage you to take your nausea medication Begin taking it at 7 pm and take it as often as prescribed for the next 24 to 72 hours.   If you develop nausea and vomiting that is not controlled by your nausea medication, call the clinic. If it is after clinic hours your family physician or the after hours number for the clinic or go to the Emergency Department.   BELOW ARE SYMPTOMS THAT SHOULD BE REPORTED IMMEDIATELY:  *FEVER GREATER THAN 100.5 F  *CHILLS WITH OR WITHOUT FEVER  NAUSEA AND VOMITING THAT IS NOT CONTROLLED WITH YOUR NAUSEA MEDICATION  *UNUSUAL SHORTNESS OF BREATH  *UNUSUAL BRUISING OR BLEEDING  TENDERNESS IN MOUTH AND THROAT WITH OR WITHOUT PRESENCE OF ULCERS  *URINARY PROBLEMS  *BOWEL PROBLEMS  UNUSUAL RASH Items with * indicate a potential emergency and should be followed up as soon as possible.  One of the nurses will contact you 24 hours after your treatment. Please let the nurse know about any problems that you may have experienced. Feel free to call the clinic you have any questions or concerns. The clinic phone number is [phone].   I have been informed and understand all the instructions given to me. I know to contact the clinic, my physician, or go to the Emergency Department if any problems should occur. I do not have any questions at this time, but understand that I may call the clinic during office hours   should I have any questions or need assistance in obtaining follow up care.    __________________________________________  _____________  __________ Signature of Patient or Authorized Representative            Date                   Time    __________________________________________ Nurse's Signature

## 2011-08-24 NOTE — Progress Notes (Signed)
OFFICE PROGRESS NOTE  CC  Stacey Blamer, MD, MD [facility] 1 [address] [place] [address] Dr. Chipper Herb Dr. Cyndia Bent  DIAGNOSIS: 41 year old with stage IIA (T2NxMx) invasive ductal carcinoma, triple negative, measuring 3.5 x 1.0 cm diagnosed March 2013  PRIOR THERAPY:  1. Seen at [facility] with self palpated breast mass, needle core biopsy positive for IDC, triple negative, stage IIA  2. Genetic testing revealed BRCA I mutation   3. S/P port placement with complication of pneumothorax  4. Begun on Neoadjuvant chemotherapy on 07/13/11 with AC dose dense. A total of 4 cycles of AC is planned. She will receive her cycle on 08/24/11  CURRENT THERAPY: Here for cycle 4 of AC  INTERVAL HISTORY: Stacey Cross 41 y.o. female returns for follow up visit.Patient looked straight for today. She denies any fevers chills night sweats headaches she has no shortness of breath no chest pains no palpitations no vaginal yeast infections. She has no bleeding problems no peripheral paresthesias. She did run this morning she is an avid runner. Remainder of the 10 point review of systems is negative.  MEDICAL HISTORY: Past Medical History  Diagnosis Date  . Breast cancer   . BRCA1 positive 08/17/2011    ALLERGIES:  is allergic to adhesive.  MEDICATIONS:  Current Outpatient Prescriptions  Medication Sig Dispense Refill  . dexamethasone (DECADRON) 4 MG tablet Take 4 mg by mouth 2 (two) times daily with a meal.      . lidocaine-prilocaine (EMLA) cream Apply topically as needed.  30 g  3  . loratadine (CLARITIN) 10 MG tablet Take 10 mg by mouth daily.      Marland Kitchen LORazepam (ATIVAN) 0.5 MG tablet Take 0.5 mg by mouth every 8 (eight) hours.      . Melatonin 1 MG CAPS Take 1 each by mouth.      . NON FORMULARY Smooth Move -Tea      . ondansetron (ZOFRAN) 8 MG tablet Take 8 mg by mouth every 8 (eight) hours as needed.      . prochlorperazine (COMPAZINE) 10 MG  tablet Take 10 mg by mouth every 6 (six) hours as needed.        SURGICAL HISTORY:  Past Surgical History  Procedure Date  . Cesarean section 2003  . Dilation and curettage of uterus 2002  . Portacath placement 06/23/2011    Procedure: INSERTION PORT-A-CATH;  Surgeon: [name], MD;  Location: [facility];  Service: General;  Laterality: N/A;  carm     REVIEW OF SYSTEMS:  Pertinent items are noted in HPI.   PHYSICAL EXAMINATION: General appearance: alert, cooperative, appears stated age, fatigued, flushed and slowed mentation Neck: no adenopathy, no carotid bruit, no JVD, supple, symmetrical, trachea midline and thyroid not enlarged, symmetric, no tenderness/mass/nodules Lymph nodes: Cervical, supraclavicular, and axillary nodes normal. Resp: clear to auscultation bilaterally and normal percussion bilaterally Back: symmetric, no curvature. ROM normal. No CVA tenderness. Cardio: regular rate and rhythm, S1, S2 normal, no murmur, click, rub or gallop GI: soft, non-tender; bowel sounds normal; no masses,  no organomegaly Extremities: extremities normal, atraumatic, no cyanosis or edema Neurologic: Alert and oriented X 3, normal strength and tone. Normal symmetric reflexes. Normal coordination and gait  ECOG PERFORMANCE STATUS: 1 - Symptomatic but completely ambulatory  Blood pressure 117/73, pulse 77, temperature 98.7 F (37.1 C), temperature source Oral, height 5\' 5"  (1.651 m), weight 139 lb 1.6 oz (63.095 kg).  LABORATORY  DATA: Lab Results  Component Value Date   WBC 14.6* 08/24/2011   HGB 10.9* 08/24/2011   HCT 30.7* 08/24/2011   MCV 93.3 08/24/2011   PLT 143* 08/24/2011      Chemistry      Component Value Date/Time   NA 140 08/17/2011 0819   K 3.9 08/17/2011 0819   CL 102 08/17/2011 0819   CO2 33* 08/17/2011 0819   BUN 21 08/17/2011 0819   CREATININE 0.63 08/17/2011 0819      Component Value Date/Time   CALCIUM 9.2 08/17/2011 0819   ALKPHOS 78 08/17/2011 0819   AST 13  08/17/2011 0819   ALT 23 08/17/2011 0819   BILITOT 0.5 08/17/2011 0819       RADIOGRAPHIC STUDIES:  ASSESSMENT: 41 year old female with  #1 stage II A. 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 She is currently being treated with neoadjuvant chemotherapy consisting of Adriamycin and Cytoxan. She has completed 2 cycles. She is s/p Cycle #3 of her scheduled Adriamycin and Cytoxan. Here for interim counts.  #2. BRCA1 Mutation Carrier  #3 once she finishes up 4 cycles of a.c. She will then go on to receive Taxol and carboplatinum on a weekly basis for a total of 12 weeks.Marland Kitchen   PLAN:  #1 overall patient is doing well and tolerated the Adriamycin and Cytoxan well. She is here for cycle 4 of AC  #2. She will call with any with any problems.  #3 I will plan on seeing her back in one week's time for interim labs.  #4 beginning on May 23 patient will begin weekly Taxol and carboplatinum for a total of 12 weeks.   All questions were answered. The patient knows to call the clinic with any problems, questions or concerns. We can certainly see the patient much sooner if necessary.  I spent 30 minutes counseling the patient face to face. The total time spent in the appointment was 30 minutes.    Drue Second, MD Medical/Oncology [facility] [phone] (beeper) [phone] (Office)  08/24/2011, 11:41 AM

## 2011-08-24 NOTE — Patient Instructions (Addendum)
Proceed with cycle 4 of chemotherapy  I will see you back in 1 week

## 2011-08-25 ENCOUNTER — Ambulatory Visit (HOSPITAL_BASED_OUTPATIENT_CLINIC_OR_DEPARTMENT_OTHER): Payer: BC Managed Care – PPO

## 2011-08-25 VITALS — BP 111/67 | HR 76 | Temp 97.4°F

## 2011-08-25 DIAGNOSIS — C50419 Malignant neoplasm of upper-outer quadrant of unspecified female breast: Secondary | ICD-10-CM

## 2011-08-25 DIAGNOSIS — Z5189 Encounter for other specified aftercare: Secondary | ICD-10-CM

## 2011-08-25 MED ORDER — PEGFILGRASTIM INJECTION 6 MG/0.6ML
6.0000 mg | Freq: Once | SUBCUTANEOUS | Status: AC
  Administered 2011-08-25: 6 mg via SUBCUTANEOUS
  Filled 2011-08-25: qty 0.6

## 2011-08-30 ENCOUNTER — Telehealth: Payer: Self-pay | Admitting: Medical Oncology

## 2011-08-30 NOTE — Telephone Encounter (Signed)
Yes and start taking it

## 2011-08-30 NOTE — Telephone Encounter (Signed)
Per MD, instructed patient to begin taking Cipro.  Contact office if symptoms develop.  Patient with follow up appointment 5/16.  Patient expressed understanding, no further questions.

## 2011-08-30 NOTE — Telephone Encounter (Signed)
Received call from patient, "My daughter has strep throat and I just got my Cipro prescription refilled.  Should I start taking it?  I don't have a fever or sore throat."  Will review with MD

## 2011-08-31 ENCOUNTER — Encounter: Payer: Self-pay | Admitting: Family

## 2011-08-31 ENCOUNTER — Other Ambulatory Visit: Payer: BC Managed Care – PPO | Admitting: Lab

## 2011-08-31 ENCOUNTER — Ambulatory Visit (HOSPITAL_BASED_OUTPATIENT_CLINIC_OR_DEPARTMENT_OTHER): Payer: BC Managed Care – PPO | Admitting: Family

## 2011-08-31 ENCOUNTER — Ambulatory Visit: Payer: BC Managed Care – PPO

## 2011-08-31 ENCOUNTER — Other Ambulatory Visit (HOSPITAL_BASED_OUTPATIENT_CLINIC_OR_DEPARTMENT_OTHER): Payer: BC Managed Care – PPO | Admitting: Lab

## 2011-08-31 VITALS — BP 106/75 | HR 66 | Temp 97.9°F | Ht 65.0 in | Wt 135.1 lb

## 2011-08-31 DIAGNOSIS — K3189 Other diseases of stomach and duodenum: Secondary | ICD-10-CM

## 2011-08-31 DIAGNOSIS — R110 Nausea: Secondary | ICD-10-CM

## 2011-08-31 DIAGNOSIS — C50419 Malignant neoplasm of upper-outer quadrant of unspecified female breast: Secondary | ICD-10-CM

## 2011-08-31 DIAGNOSIS — D709 Neutropenia, unspecified: Secondary | ICD-10-CM

## 2011-08-31 DIAGNOSIS — C50919 Malignant neoplasm of unspecified site of unspecified female breast: Secondary | ICD-10-CM

## 2011-08-31 LAB — CBC WITH DIFFERENTIAL/PLATELET
Basophils Absolute: 0 10*3/uL (ref 0.0–0.1)
Eosinophils Absolute: 0 10*3/uL (ref 0.0–0.5)
HGB: 10.4 g/dL — ABNORMAL LOW (ref 11.6–15.9)
LYMPH%: 45.4 % (ref 14.0–49.7)
MCV: 96.3 fL (ref 79.5–101.0)
MONO%: 11.5 % (ref 0.0–14.0)
NEUT#: 0.4 10*3/uL — CL (ref 1.5–6.5)
Platelets: 117 10*3/uL — ABNORMAL LOW (ref 145–400)

## 2011-08-31 LAB — COMPREHENSIVE METABOLIC PANEL
CO2: 28 mEq/L (ref 19–32)
Calcium: 8.4 mg/dL (ref 8.4–10.5)
Creatinine, Ser: 0.53 mg/dL (ref 0.50–1.10)
Glucose, Bld: 89 mg/dL (ref 70–99)
Sodium: 132 mEq/L — ABNORMAL LOW (ref 135–145)
Total Bilirubin: 0.7 mg/dL (ref 0.3–1.2)
Total Protein: 5.9 g/dL — ABNORMAL LOW (ref 6.0–8.3)

## 2011-08-31 MED ORDER — SUCRALFATE 1 G PO TABS: 1.0000 g | ORAL_TABLET | Freq: Four times a day (QID) | ORAL | Status: DC

## 2011-08-31 NOTE — Progress Notes (Signed)
[facility]  Name: Stacey Cross                  DATE: 08/31/2011 MRN: [id number]                      DOB: 05-May-1970  REFERRING PHYSICIAN: Levi Aland, MD  DIAGNOSIS: Patient Active Problem List  Diagnoses Date Noted  . BRCA1 positive 08/17/2011  . Neutropenia associated with mucositis 08/03/2011  . Thrush of mouth and esophagus 08/03/2011  . Cancer of upper-outer quadrant of female breast 06/13/2011     Encounter Diagnosis  Name Primary?  . Breast cancer, triple negative. BRCA 1 positive.  Yes    PREVIOUS THERAPY:  1. Left breast biopsy 06/09/11 2. Bilateral breast MRI 06/14/11. 3. Port a cath placement with subsequent pneumothorax 06/23/11.  4. Baseline echo 06/30/11. 4. PET 07/04/11.   CURRENT THERAPY: Completed dose dense neoadjuvant AC last week, will begin Taxol/Carbo 09/07/11.    INTERIM HISTORY: Daughter has strep throat, is on Amoxicillin. Pt's mother is here from [place] to help out while she is taking chemo. She is caring for the daughter, trying to minimize contact with the pt. She is tearful as she discusses these arrangements. Pt has been on Cipro prophylactically since yesterday. State she does not feel well, no fever, but extreme malaise. Says she is drinking adequate fluids. Appetite is poor, has persistent "sour stomach" with associated nausea.   Is happy she has completed dose dense AC, has multiple questions about what to expect from Taxol/Carbo. Has mild paresthesias in the right thumb and index finger.   PHYSICAL EXAM: BP 106/75  Pulse 66  Temp(Src) 97.9 F (36.6 C) (Oral)  Ht 5\' 5"  (1.651 m)  Wt 135 lb 1.6 oz (61.281 kg)  BMI 22.48 kg/m2 General: Well developed, well nourished, in no acute distress. Accompanied by her husband who is supportive.  EENT: No ocular or oral lesions. No stomatitis.  Respiratory: Lungs are clear to auscultation bilaterally with normal respiratory movement and no accessory muscle use. Cardiac: No murmur, rub or  tachycardia. No upper or lower extremity edema.  GI: Abdomen is soft, no palpable hepatosplenomegaly. No fluid wave. No tenderness. Musculoskeletal: No kyphosis, no tenderness over the spine, ribs or hips. Lymph: No cervical, infraclavicular, axillary or inguinal adenopathy. Neuro: No focal neurological deficits. Psych: Alert and oriented X 3, appropriate mood and affect.    LABORATORY STUDIES:   Results for orders placed in visit on 08/31/11  CBC WITH DIFFERENTIAL      Component Value Range   WBC 1.0 (*) 3.9 - 10.3 (10e3/uL)   NEUT# 0.4 (*) 1.5 - 6.5 (10e3/uL)   HGB 10.4 (*) 11.6 - 15.9 (g/dL)   HCT 08.6 (*) 57.8 - 46.6 (%)   Platelets 117 (*) 145 - 400 (10e3/uL)   MCV 96.3  79.5 - 101.0 (fL)   MCH 34.5 (*) 25.1 - 34.0 (pg)   MCHC 35.9  31.5 - 36.0 (g/dL)   RBC 4.69 (*) 6.29 - 5.45 (10e6/uL)   RDW 14.2  11.2 - 14.5 (%)   lymph# 0.4 (*) 0.9 - 3.3 (10e3/uL)   MONO# 0.1  0.1 - 0.9 (10e3/uL)   Eosinophils Absolute 0.0  0.0 - 0.5 (10e3/uL)   Basophils Absolute 0.0  0.0 - 0.1 (10e3/uL)   NEUT% 42.0  38.4 - 76.8 (%)   LYMPH% 45.4  14.0 - 49.7 (%)   MONO% 11.5  0.0 - 14.0 (%)   EOS%  0.7  0.0 - 7.0 (%)   BASO% 0.4  0.0 - 2.0 (%)   nRBC 0  0 - 0 (%)    IMPRESSION:  41 year old female with:  1. Left breast cancer, BRCA-1 positive, on neoadjuvant chemo. 2. Dyspepsia 3. Neutropenia.  PLAN:   1. No treatment today.  2. Prescription for Carafate 3. Continue Cipro for neutropenia (started yesterday).  4. Return in 1 week for appt with Dr. Welton Flakes and lab prior. Lab orders are entered for that visit.

## 2011-09-01 ENCOUNTER — Ambulatory Visit: Payer: BC Managed Care – PPO

## 2011-09-01 ENCOUNTER — Telehealth: Payer: Self-pay | Admitting: *Deleted

## 2011-09-01 NOTE — Telephone Encounter (Signed)
Pt called regarding n/v.  Discussed anti-nausea mediation and when to take them.  Pt voiced back the medications and when to take them.  Encourage pt to call with further needs or concerns.  Received verbal understanding.  Contact information given.

## 2011-09-07 ENCOUNTER — Encounter: Payer: Self-pay | Admitting: Oncology

## 2011-09-07 ENCOUNTER — Ambulatory Visit (HOSPITAL_BASED_OUTPATIENT_CLINIC_OR_DEPARTMENT_OTHER): Payer: BC Managed Care – PPO | Admitting: Oncology

## 2011-09-07 ENCOUNTER — Other Ambulatory Visit (HOSPITAL_BASED_OUTPATIENT_CLINIC_OR_DEPARTMENT_OTHER): Payer: BC Managed Care – PPO | Admitting: Lab

## 2011-09-07 ENCOUNTER — Ambulatory Visit (HOSPITAL_BASED_OUTPATIENT_CLINIC_OR_DEPARTMENT_OTHER): Payer: BC Managed Care – PPO

## 2011-09-07 ENCOUNTER — Other Ambulatory Visit: Payer: Self-pay | Admitting: *Deleted

## 2011-09-07 VITALS — BP 96/61 | HR 64 | Temp 97.8°F

## 2011-09-07 VITALS — BP 126/78 | HR 50 | Temp 97.9°F | Ht 65.0 in | Wt 134.5 lb

## 2011-09-07 DIAGNOSIS — Z1509 Genetic susceptibility to other malignant neoplasm: Secondary | ICD-10-CM

## 2011-09-07 DIAGNOSIS — C50419 Malignant neoplasm of upper-outer quadrant of unspecified female breast: Secondary | ICD-10-CM

## 2011-09-07 DIAGNOSIS — Z1501 Genetic susceptibility to malignant neoplasm of breast: Secondary | ICD-10-CM

## 2011-09-07 DIAGNOSIS — Z5111 Encounter for antineoplastic chemotherapy: Secondary | ICD-10-CM

## 2011-09-07 LAB — CBC WITH DIFFERENTIAL/PLATELET
Eosinophils Absolute: 0 10*3/uL (ref 0.0–0.5)
LYMPH%: 11 % — ABNORMAL LOW (ref 14.0–49.7)
MONO#: 0.8 10*3/uL (ref 0.1–0.9)
NEUT#: 8.3 10*3/uL — ABNORMAL HIGH (ref 1.5–6.5)
Platelets: 134 10*3/uL — ABNORMAL LOW (ref 145–400)
RBC: 3.18 10*6/uL — ABNORMAL LOW (ref 3.70–5.45)
WBC: 10.3 10*3/uL (ref 3.9–10.3)
lymph#: 1.1 10*3/uL (ref 0.9–3.3)
nRBC: 0 % (ref 0–0)

## 2011-09-07 LAB — COMPREHENSIVE METABOLIC PANEL
ALT: 23 U/L (ref 0–35)
AST: 15 U/L (ref 0–37)
Albumin: 4.2 g/dL (ref 3.5–5.2)
Calcium: 8.7 mg/dL (ref 8.4–10.5)
Chloride: 104 mEq/L (ref 96–112)
Potassium: 3.9 mEq/L (ref 3.5–5.3)
Total Protein: 6.1 g/dL (ref 6.0–8.3)

## 2011-09-07 MED ORDER — DIPHENHYDRAMINE HCL 50 MG/ML IJ SOLN
50.0000 mg | Freq: Once | INTRAMUSCULAR | Status: AC
  Administered 2011-09-07: 50 mg via INTRAVENOUS

## 2011-09-07 MED ORDER — LORAZEPAM 0.5 MG PO TABS: 0.5000 mg | ORAL_TABLET | Freq: Four times a day (QID) | ORAL | Status: DC | PRN

## 2011-09-07 MED ORDER — DEXAMETHASONE 4 MG PO TABS: ORAL_TABLET | ORAL | Status: DC

## 2011-09-07 MED ORDER — FAMOTIDINE IN NACL 20-0.9 MG/50ML-% IV SOLN
20.0000 mg | Freq: Once | INTRAVENOUS | Status: AC
  Administered 2011-09-07: 20 mg via INTRAVENOUS

## 2011-09-07 MED ORDER — ONDANSETRON HCL 8 MG PO TABS: ORAL_TABLET | ORAL | Status: DC

## 2011-09-07 MED ORDER — SODIUM CHLORIDE 0.9 % IV SOLN
300.0000 mg | Freq: Once | INTRAVENOUS | Status: AC
  Administered 2011-09-07: 300 mg via INTRAVENOUS
  Filled 2011-09-07: qty 30

## 2011-09-07 MED ORDER — ONDANSETRON 16 MG/50ML IVPB (CHCC)
16.0000 mg | Freq: Once | INTRAVENOUS | Status: AC
  Administered 2011-09-07: 16 mg via INTRAVENOUS

## 2011-09-07 MED ORDER — DEXAMETHASONE SODIUM PHOSPHATE 4 MG/ML IJ SOLN
20.0000 mg | Freq: Once | INTRAMUSCULAR | Status: AC
  Administered 2011-09-07: 20 mg via INTRAVENOUS

## 2011-09-07 MED ORDER — SODIUM CHLORIDE 0.9 % IJ SOLN
10.0000 mL | INTRAMUSCULAR | Status: DC | PRN
  Administered 2011-09-07: 10 mL
  Filled 2011-09-07: qty 10

## 2011-09-07 MED ORDER — CIPROFLOXACIN HCL 500 MG PO TABS: 500.0000 mg | ORAL_TABLET | Freq: Two times a day (BID) | ORAL | Status: DC

## 2011-09-07 MED ORDER — HEPARIN SOD (PORK) LOCK FLUSH 100 UNIT/ML IV SOLN
500.0000 [IU] | Freq: Once | INTRAVENOUS | Status: AC | PRN
  Administered 2011-09-07: 500 [IU]
  Filled 2011-09-07: qty 5

## 2011-09-07 MED ORDER — SODIUM CHLORIDE 0.9 % IV SOLN
Freq: Once | INTRAVENOUS | Status: AC
  Administered 2011-09-07: 12:00:00 via INTRAVENOUS

## 2011-09-07 MED ORDER — PROCHLORPERAZINE 25 MG RE SUPP: 25.0000 mg | Freq: Two times a day (BID) | RECTAL | Status: DC | PRN

## 2011-09-07 MED ORDER — PACLITAXEL CHEMO INJECTION 300 MG/50ML
80.0000 mg/m2 | Freq: Once | INTRAVENOUS | Status: AC
  Administered 2011-09-07: 132 mg via INTRAVENOUS
  Filled 2011-09-07: qty 22

## 2011-09-07 MED ORDER — PROCHLORPERAZINE MALEATE 10 MG PO TABS: 10.0000 mg | ORAL_TABLET | Freq: Four times a day (QID) | ORAL | Status: DC | PRN

## 2011-09-07 NOTE — Telephone Encounter (Signed)
Adjusted tx appts to [facility]. Confirmed w/KK and per KK inj appts cx'd as pt has completed AC is now having wkly TC only. Pt given new schedule for may thru July.

## 2011-09-07 NOTE — Patient Instructions (Signed)
[facility] Discharge Instructions for Patients Receiving Chemotherapy  Today you received the following chemotherapy agents Taxol and Carboplatin.  To help prevent nausea and vomiting after your treatment, we encourage you to take your nausea medication.   If you develop nausea and vomiting that is not controlled by your nausea medication, call the clinic. If it is after clinic hours your family physician or the after hours number for the clinic or go to the Emergency Department.   BELOW ARE SYMPTOMS THAT SHOULD BE REPORTED IMMEDIATELY:  *FEVER GREATER THAN 100.5 F  *CHILLS WITH OR WITHOUT FEVER  NAUSEA AND VOMITING THAT IS NOT CONTROLLED WITH YOUR NAUSEA MEDICATION  *UNUSUAL SHORTNESS OF BREATH  *UNUSUAL BRUISING OR BLEEDING  TENDERNESS IN MOUTH AND THROAT WITH OR WITHOUT PRESENCE OF ULCERS  *URINARY PROBLEMS  *BOWEL PROBLEMS  UNUSUAL RASH Items with * indicate a potential emergency and should be followed up as soon as possible.  One of the nurses will contact you 24 hours after your treatment. Please let the nurse know about any problems that you may have experienced. Feel free to call the clinic you have any questions or concerns. The clinic phone number is [phone].   I have been informed and understand all the instructions given to me. I know to contact the clinic, my physician, or go to the Emergency Department if any problems should occur. I do not have any questions at this time, but understand that I may call the clinic during office hours   should I have any questions or need assistance in obtaining follow up care.    __________________________________________  _____________  __________ Signature of Patient or Authorized Representative            Date                   Time    __________________________________________ Nurse's Signature    

## 2011-09-07 NOTE — Progress Notes (Signed)
OFFICE PROGRESS NOTE  CC  [name], MD, MD [facility] 1 [address] [place] [address] Dr. Chipper Herb Dr. Cyndia Bent  DIAGNOSIS: 41 year old with stage IIA (T2NxMx) invasive ductal carcinoma, triple negative, measuring 3.5 x 1.0 cm diagnosed March 2013  PRIOR THERAPY:  1. Seen at [facility] with self palpated breast mass, needle core biopsy positive for IDC, triple negative, stage IIA  2. Genetic testing revealed BRCA I mutation   3. S/P port placement with complication of pneumothorax  4. S/P Neoadjuvant chemotherapy with Adriamycin/cytoxan given dose dense between 07/13/11 - 08/24/11   5. Neoadjuvant carb/taxol begin 09/06/12  X 12 weeks  CURRENT THERAPY:  Taxol/carboplatin weekly , here for week #1/12  INTERVAL HISTORY: Stacey Cross 41 y.o. female returns for follow up visit.She has completed all of her neoadjuvant Adriamycin and Cytoxan between 07/13/2011 2 08/24/2011. Overall she tolerated the treatment very well. She today feels well she has no fevers chills night sweats headaches no shortness of breath no chest pains or palpitations. She does tell me that her daughter has mononucleosis and her son may have struck. I did recommend that she begin taking her antibiotics prophylactically while her children are 6. She has no peripheral paresthesias. Patient will begin Taxol and carboplatin today we discussed the rationale for this. We also discussed the risks and benefits and significant particular side effects to both Taxol as what was carboplatinum. She understands that she will be receiving 12 weeks' worth of this therapy and then after that she will be seeing of getting her surgery. Patient does need to be referred back to Dr. Jamey Ripa for him followup in the next week or so since she is almost midway between her treatments. Remainder of the 10 point review of systems is negative.  MEDICAL HISTORY: Past Medical History  Diagnosis Date    . Breast cancer   . BRCA1 positive 08/17/2011    ALLERGIES:  is allergic to adhesive.  MEDICATIONS:  Current Outpatient Prescriptions  Medication Sig Dispense Refill  . ciprofloxacin (CIPRO) 500 MG tablet       . dexamethasone (DECADRON) 4 MG tablet Take 4 mg by mouth 2 (two) times daily with a meal.      . fluconazole (DIFLUCAN) 200 MG tablet       . HYDROcodone-acetaminophen (NORCO) 5-325 MG per tablet       . lidocaine-prilocaine (EMLA) cream Apply topically as needed.  30 g  3  . loratadine (CLARITIN) 10 MG tablet Take 10 mg by mouth daily.      Marland Kitchen LORazepam (ATIVAN) 0.5 MG tablet Take 0.5 mg by mouth every 8 (eight) hours.      . Melatonin 1 MG CAPS Take 1 each by mouth.      . NON FORMULARY Smooth Move -Tea      . nystatin (MYCOSTATIN) 100000 UNIT/ML suspension       . ondansetron (ZOFRAN) 8 MG tablet Take 8 mg by mouth every 8 (eight) hours as needed.      . prochlorperazine (COMPAZINE) 10 MG tablet Take 10 mg by mouth every 6 (six) hours as needed.      . sucralfate (CARAFATE) 1 G tablet Take 1 tablet (1 g total) by mouth 4 (four) times daily.  90 tablet  0  . dexamethasone (DECADRON) 4 MG tablet Take 2 tablets by mouth two times a day starting the day after chemotherapy for 3 days.  30 tablet  1  .  LORazepam (ATIVAN) 0.5 MG tablet Take 1 tablet (0.5 mg total) by mouth every 6 (six) hours as needed (Nausea or vomiting).  30 tablet  0  . ondansetron (ZOFRAN) 8 MG tablet Take 1 tab two times a day starting the day after chemo for 3 days. Then take 1 tab two times a day as needed for nausea or vomiting.  30 tablet  1  . prochlorperazine (COMPAZINE) 10 MG tablet Take 1 tablet (10 mg total) by mouth every 6 (six) hours as needed (Nausea or vomiting).  30 tablet  1  . prochlorperazine (COMPAZINE) 25 MG suppository Place 1 suppository (25 mg total) rectally every 12 (twelve) hours as needed for nausea.  12 suppository  3    SURGICAL HISTORY:  Past Surgical History  Procedure Date  .  Cesarean section 2003  . Dilation and curettage of uterus 2002  . Portacath placement 06/23/2011    Procedure: INSERTION PORT-A-CATH;  Surgeon: [name], MD;  Location: [facility];  Service: General;  Laterality: N/A;  carm     REVIEW OF SYSTEMS:  Pertinent items are noted in HPI.   PHYSICAL EXAMINATION: General appearance: alert, cooperative, appears stated age, fatigued, flushed and slowed mentation Neck: no adenopathy, no carotid bruit, no JVD, supple, symmetrical, trachea midline and thyroid not enlarged, symmetric, no tenderness/mass/nodules Lymph nodes: Cervical, supraclavicular, and axillary nodes normal. Resp: clear to auscultation bilaterally and normal percussion bilaterally Back: symmetric, no curvature. ROM normal. No CVA tenderness. Cardio: regular rate and rhythm, S1, S2 normal, no murmur, click, rub or gallop GI: soft, non-tender; bowel sounds normal; no masses,  no organomegaly Extremities: extremities normal, atraumatic, no cyanosis or edema Neurologic: Alert and oriented X 3, normal strength and tone. Normal symmetric reflexes. Normal coordination and gait  ECOG PERFORMANCE STATUS: 1 - Symptomatic but completely ambulatory  Blood pressure 126/78, pulse 50, temperature 97.9 F (36.6 C), temperature source Oral, height 5\' 5"  (1.651 m), weight 134 lb 8 oz (61.009 kg).  LABORATORY DATA: Lab Results  Component Value Date   WBC 10.3 09/07/2011   HGB 10.8* 09/07/2011   HCT 30.7* 09/07/2011   MCV 96.5 09/07/2011   PLT 134* 09/07/2011      Chemistry      Component Value Date/Time   NA 132* 08/31/2011 1021   K 3.9 08/31/2011 1021   CL 98 08/31/2011 1021   CO2 28 08/31/2011 1021   BUN 15 08/31/2011 1021   CREATININE 0.53 08/31/2011 1021      Component Value Date/Time   CALCIUM 8.4 08/31/2011 1021   ALKPHOS 78 08/31/2011 1021   AST 11 08/31/2011 1021   ALT 35 08/31/2011 1021   BILITOT 0.7 08/31/2011 1021       RADIOGRAPHIC STUDIES:  ASSESSMENT:  41 year old female with  #1 stage II A. 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   #2. BRCA1 Mutation Carrier  #3 Patient completed 4 cycles of Neoadjuvant Adriamycin Cytoxan on 08/24/2011.  #4 she will now begin neoadjuvant weekly carboplatinum and Taxol. Carboplatinum is being utilized since patient is a BRCA1 mutation carrier and these individuals may be more sensitive to platinum containing regimens. PLAN:  #1 patient will receive week #1 of Taxol and carboplatinum today. Risks and benefits of both drugs were explained to her very clearly. She has her anti-emetics at home.  #2 patient does need to be seen back by Dr. Jamey Ripa for midway followup and I will get this scheduled for her.  #3 patient  will return in one week's time for week #2 of her scheduled chemotherapy.  All questions were answered. The patient knows to call the clinic with any problems, questions or concerns. We can certainly see the patient much sooner if necessary.  I spent 30 minutes counseling the patient face to face. The total time spent in the appointment was 30 minutes.    Drue Second, MD Medical/Oncology [facility] [phone] (beeper) [phone] (Office)  09/07/2011, 11:25 AM

## 2011-09-07 NOTE — Patient Instructions (Signed)
1. Proceed with taxol/carboplatin a total of 12 weeks of this is planned  2. Return in 1 weeks

## 2011-09-08 ENCOUNTER — Telehealth: Payer: Self-pay | Admitting: *Deleted

## 2011-09-08 NOTE — Telephone Encounter (Signed)
Left VM for patient to call back regarding status post chemo tx.

## 2011-09-08 NOTE — Telephone Encounter (Signed)
PT. RETURNED CHEMO FOLLOW UP CALL PLACED BY SUSAN COWARD,RN. SHE IS HAVING SOME NAUSEA BUT MEDICATION IS TAKING CARE OF THIS PROBLEM. NO OTHER PROBLEMS OR QUESTIONS AT THIS TIME. PT. WILL CALL THIS OFFICE IF THE NEED ARISES. SHE IS AWARE THAT THERE IS AN ON CALL PHYSICIAN AVAILABLE THIS WEEKEND AND HOLIDAY FOR ANY PROBLEMS OR CONCERNS.

## 2011-09-14 ENCOUNTER — Other Ambulatory Visit (HOSPITAL_BASED_OUTPATIENT_CLINIC_OR_DEPARTMENT_OTHER): Payer: BC Managed Care – PPO | Admitting: Lab

## 2011-09-14 ENCOUNTER — Ambulatory Visit (HOSPITAL_BASED_OUTPATIENT_CLINIC_OR_DEPARTMENT_OTHER): Payer: BC Managed Care – PPO

## 2011-09-14 ENCOUNTER — Ambulatory Visit (HOSPITAL_BASED_OUTPATIENT_CLINIC_OR_DEPARTMENT_OTHER): Payer: BC Managed Care – PPO | Admitting: Family

## 2011-09-14 ENCOUNTER — Encounter: Payer: Self-pay | Admitting: Family

## 2011-09-14 ENCOUNTER — Ambulatory Visit: Payer: BC Managed Care – PPO

## 2011-09-14 VITALS — BP 120/77 | HR 64 | Temp 97.9°F | Ht 65.0 in | Wt 138.9 lb

## 2011-09-14 DIAGNOSIS — C50419 Malignant neoplasm of upper-outer quadrant of unspecified female breast: Secondary | ICD-10-CM

## 2011-09-14 DIAGNOSIS — Z1589 Genetic susceptibility to other disease: Secondary | ICD-10-CM

## 2011-09-14 DIAGNOSIS — C50919 Malignant neoplasm of unspecified site of unspecified female breast: Secondary | ICD-10-CM

## 2011-09-14 DIAGNOSIS — D649 Anemia, unspecified: Secondary | ICD-10-CM

## 2011-09-14 DIAGNOSIS — Z5111 Encounter for antineoplastic chemotherapy: Secondary | ICD-10-CM

## 2011-09-14 LAB — COMPREHENSIVE METABOLIC PANEL
ALT: 20 U/L (ref 0–35)
AST: 14 U/L (ref 0–37)
Alkaline Phosphatase: 46 U/L (ref 39–117)
Creatinine, Ser: 0.59 mg/dL (ref 0.50–1.10)
Total Bilirubin: 0.3 mg/dL (ref 0.3–1.2)

## 2011-09-14 LAB — CBC WITH DIFFERENTIAL/PLATELET
BASO%: 0.1 % (ref 0.0–2.0)
EOS%: 0.4 % (ref 0.0–7.0)
HCT: 27.6 % — ABNORMAL LOW (ref 34.8–46.6)
LYMPH%: 18.2 % (ref 14.0–49.7)
MCH: 33.8 pg (ref 25.1–34.0)
MCHC: 35.9 g/dL (ref 31.5–36.0)
MONO%: 9 % (ref 0.0–14.0)
NEUT%: 72.3 % (ref 38.4–76.8)
Platelets: 217 10*3/uL (ref 145–400)

## 2011-09-14 MED ORDER — LORAZEPAM 0.5 MG PO TABS: 0.5000 mg | ORAL_TABLET | Freq: Four times a day (QID) | ORAL | Status: DC | PRN

## 2011-09-14 MED ORDER — HEPARIN SOD (PORK) LOCK FLUSH 100 UNIT/ML IV SOLN
500.0000 [IU] | Freq: Once | INTRAVENOUS | Status: AC | PRN
  Administered 2011-09-14: 500 [IU]
  Filled 2011-09-14: qty 5

## 2011-09-14 MED ORDER — SODIUM CHLORIDE 0.9 % IJ SOLN
10.0000 mL | INTRAMUSCULAR | Status: DC | PRN
  Administered 2011-09-14: 10 mL
  Filled 2011-09-14: qty 10

## 2011-09-14 MED ORDER — SODIUM CHLORIDE 0.9 % IV SOLN
300.0000 mg | Freq: Once | INTRAVENOUS | Status: AC
  Administered 2011-09-14: 300 mg via INTRAVENOUS
  Filled 2011-09-14: qty 30

## 2011-09-14 MED ORDER — DEXAMETHASONE SODIUM PHOSPHATE 4 MG/ML IJ SOLN
20.0000 mg | Freq: Once | INTRAMUSCULAR | Status: AC
  Administered 2011-09-14: 20 mg via INTRAVENOUS

## 2011-09-14 MED ORDER — PACLITAXEL CHEMO INJECTION 300 MG/50ML
80.0000 mg/m2 | Freq: Once | INTRAVENOUS | Status: AC
  Administered 2011-09-14: 132 mg via INTRAVENOUS
  Filled 2011-09-14: qty 22

## 2011-09-14 MED ORDER — DIPHENHYDRAMINE HCL 50 MG/ML IJ SOLN
25.0000 mg | Freq: Once | INTRAMUSCULAR | Status: AC
  Administered 2011-09-14: 25 mg via INTRAVENOUS

## 2011-09-14 MED ORDER — SODIUM CHLORIDE 0.9 % IV SOLN
Freq: Once | INTRAVENOUS | Status: AC
  Administered 2011-09-14: 13:00:00 via INTRAVENOUS

## 2011-09-14 MED ORDER — ONDANSETRON 16 MG/50ML IVPB (CHCC)
16.0000 mg | Freq: Once | INTRAVENOUS | Status: AC
  Administered 2011-09-14: 16 mg via INTRAVENOUS

## 2011-09-14 MED ORDER — FAMOTIDINE IN NACL 20-0.9 MG/50ML-% IV SOLN
20.0000 mg | Freq: Once | INTRAVENOUS | Status: AC
  Administered 2011-09-14: 20 mg via INTRAVENOUS

## 2011-09-14 NOTE — Progress Notes (Signed)
[facility]  Name: Stacey Cross                  DATE: 09/14/2011 MRN: [id number]                      DOB: March 13, 1971  REFERRING PHYSICIAN: Levi Aland, MD  DIAGNOSIS: Patient Active Problem List  Diagnoses Date Noted  . BRCA1 positive 08/17/2011  . Neutropenia associated with mucositis 08/03/2011  . Cancer of upper-outer quadrant of female breast 06/13/2011     Encounter Diagnosis  Name Primary?  . Breast cancer, triple negative. BRCA 1 positive.  Yes    PREVIOUS THERAPY:  1. Left breast biopsy 06/09/11 2. Bilateral breast MRI 06/14/11. 3. Port a cath placement with subsequent pneumothorax 06/23/11.  4. Baseline echo 06/30/11. 4. PET 07/04/11.   CURRENT THERAPY: Taxol/Carbo weekly, began 09/07/11.    INTERIM HISTORY: Tolerating Taxol well. Is happy to have completed A/C. Chief complaint is mild fatigue, cumulative with each cycle. Overall, feels much improved on Taxol/Carbo, compared to A/C.   Has not needed antiemetics, using Lorazepam for mild transient nausea. No headache or blurred vision. No cough or shortness of breath. No abdominal pain or new bone pain. Bowel and bladder function are normal. Appetite is good, with adequate fluid intake. Remainder of the 10 point  review of systems is negative.   PHYSICAL EXAM: There were no vitals taken for this visit. General: Well developed, well nourished, in no acute distress. Accompanied by her husband who is supportive.  EENT: No ocular or oral lesions. No stomatitis.  Respiratory: Lungs are clear to auscultation bilaterally with normal respiratory movement and no accessory muscle use. Cardiac: No murmur, rub or tachycardia. No upper or lower extremity edema.  GI: Abdomen is soft, no palpable hepatosplenomegaly. No fluid wave. No tenderness. Musculoskeletal: No kyphosis, no tenderness over the spine, ribs or hips. Lymph: No cervical, infraclavicular, axillary or inguinal adenopathy. Neuro: No focal neurological  deficits. Psych: Alert and oriented X 3, appropriate mood and affect.    LABORATORY STUDIES:   Results for orders placed in visit on 09/14/11  CBC WITH DIFFERENTIAL      Component Value Range   WBC 6.7  3.9 - 10.3 (10e3/uL)   NEUT# 4.8  1.5 - 6.5 (10e3/uL)   HGB 9.9 (*) 11.6 - 15.9 (g/dL)   HCT 29.5 (*) 28.4 - 46.6 (%)   Platelets 217  145 - 400 (10e3/uL)   MCV 94.2  79.5 - 101.0 (fL)   MCH 33.8  25.1 - 34.0 (pg)   MCHC 35.9  31.5 - 36.0 (g/dL)   RBC 1.32 (*) 4.40 - 5.45 (10e6/uL)   RDW 15.8 (*) 11.2 - 14.5 (%)   lymph# 1.2  0.9 - 3.3 (10e3/uL)   MONO# 0.6  0.1 - 0.9 (10e3/uL)   Eosinophils Absolute 0.0  0.0 - 0.5 (10e3/uL)   Basophils Absolute 0.0  0.0 - 0.1 (10e3/uL)   NEUT% 72.3  38.4 - 76.8 (%)   LYMPH% 18.2  14.0 - 49.7 (%)   MONO% 9.0  0.0 - 14.0 (%)   EOS% 0.4  0.0 - 7.0 (%)   BASO% 0.1  0.0 - 2.0 (%)    IMPRESSION:  41 year old female with:  1. Left breast cancer, BRCA-1 positive, on neoadjuvant chemo. 2. Mild anemia, hgb trending downward.  3. Good tolerance of Taxol/Carbo.   PLAN:   1. Return in 1 week for appt with Dr. Welton Flakes,  treatment and lab prior. Lab orders are entered for that visit.  2. Refill Lorazepam per her request.    She would like to hold the po Decadron, she is not sleeping well and doesn't like the way it makes her feel. I let her know she will get a dose with today's treatment, so she can hold it tonight and assess whether she is nauseated and needs Decadron tomorrow. Also uses Lorazepam for nausea with good results.

## 2011-09-15 ENCOUNTER — Ambulatory Visit: Payer: BC Managed Care – PPO

## 2011-09-21 ENCOUNTER — Ambulatory Visit (HOSPITAL_BASED_OUTPATIENT_CLINIC_OR_DEPARTMENT_OTHER): Payer: BC Managed Care – PPO | Admitting: Oncology

## 2011-09-21 ENCOUNTER — Other Ambulatory Visit: Payer: Self-pay | Admitting: Medical Oncology

## 2011-09-21 ENCOUNTER — Ambulatory Visit (HOSPITAL_BASED_OUTPATIENT_CLINIC_OR_DEPARTMENT_OTHER): Payer: BC Managed Care – PPO

## 2011-09-21 ENCOUNTER — Other Ambulatory Visit (HOSPITAL_BASED_OUTPATIENT_CLINIC_OR_DEPARTMENT_OTHER): Payer: BC Managed Care – PPO | Admitting: Lab

## 2011-09-21 ENCOUNTER — Encounter: Payer: Self-pay | Admitting: Oncology

## 2011-09-21 VITALS — BP 122/81 | HR 98 | Temp 97.7°F | Ht 65.0 in | Wt 140.6 lb

## 2011-09-21 DIAGNOSIS — Z1501 Genetic susceptibility to malignant neoplasm of breast: Secondary | ICD-10-CM

## 2011-09-21 DIAGNOSIS — C50419 Malignant neoplasm of upper-outer quadrant of unspecified female breast: Secondary | ICD-10-CM

## 2011-09-21 DIAGNOSIS — Z5111 Encounter for antineoplastic chemotherapy: Secondary | ICD-10-CM

## 2011-09-21 LAB — CBC WITH DIFFERENTIAL/PLATELET
BASO%: 0.4 % (ref 0.0–2.0)
Basophils Absolute: 0 10*3/uL (ref 0.0–0.1)
Eosinophils Absolute: 0 10*3/uL (ref 0.0–0.5)
HCT: 28.8 % — ABNORMAL LOW (ref 34.8–46.6)
HGB: 10.1 g/dL — ABNORMAL LOW (ref 11.6–15.9)
MCHC: 35.1 g/dL (ref 31.5–36.0)
MONO#: 0.4 10*3/uL (ref 0.1–0.9)
NEUT#: 3.6 10*3/uL (ref 1.5–6.5)
NEUT%: 71.7 % (ref 38.4–76.8)
WBC: 5 10*3/uL (ref 3.9–10.3)
lymph#: 1 10*3/uL (ref 0.9–3.3)

## 2011-09-21 LAB — COMPREHENSIVE METABOLIC PANEL
Alkaline Phosphatase: 39 U/L (ref 39–117)
BUN: 10 mg/dL (ref 6–23)
CO2: 25 mEq/L (ref 19–32)
Creatinine, Ser: 0.54 mg/dL (ref 0.50–1.10)
Glucose, Bld: 99 mg/dL (ref 70–99)
Total Bilirubin: 0.4 mg/dL (ref 0.3–1.2)

## 2011-09-21 MED ORDER — SODIUM CHLORIDE 0.9 % IV SOLN
Freq: Once | INTRAVENOUS | Status: AC
  Administered 2011-09-21: 12:00:00 via INTRAVENOUS

## 2011-09-21 MED ORDER — ONDANSETRON 16 MG/50ML IVPB (CHCC)
16.0000 mg | Freq: Once | INTRAVENOUS | Status: AC
  Administered 2011-09-21: 16 mg via INTRAVENOUS

## 2011-09-21 MED ORDER — SODIUM CHLORIDE 0.9 % IJ SOLN
10.0000 mL | INTRAMUSCULAR | Status: DC | PRN
  Administered 2011-09-21: 10 mL
  Filled 2011-09-21: qty 10

## 2011-09-21 MED ORDER — DEXAMETHASONE SODIUM PHOSPHATE 4 MG/ML IJ SOLN
20.0000 mg | Freq: Once | INTRAMUSCULAR | Status: AC
  Administered 2011-09-21: 20 mg via INTRAVENOUS

## 2011-09-21 MED ORDER — FAMOTIDINE IN NACL 20-0.9 MG/50ML-% IV SOLN
20.0000 mg | Freq: Once | INTRAVENOUS | Status: AC
  Administered 2011-09-21: 20 mg via INTRAVENOUS

## 2011-09-21 MED ORDER — SODIUM CHLORIDE 0.9 % IV SOLN
300.0000 mg | Freq: Once | INTRAVENOUS | Status: AC
  Administered 2011-09-21: 300 mg via INTRAVENOUS
  Filled 2011-09-21: qty 30

## 2011-09-21 MED ORDER — HEPARIN SOD (PORK) LOCK FLUSH 100 UNIT/ML IV SOLN
500.0000 [IU] | Freq: Once | INTRAVENOUS | Status: AC | PRN
  Administered 2011-09-21: 500 [IU]
  Filled 2011-09-21: qty 5

## 2011-09-21 MED ORDER — DIPHENHYDRAMINE HCL 50 MG/ML IJ SOLN
25.0000 mg | Freq: Once | INTRAMUSCULAR | Status: AC
  Administered 2011-09-21: 25 mg via INTRAVENOUS

## 2011-09-21 MED ORDER — PACLITAXEL CHEMO INJECTION 300 MG/50ML
80.0000 mg/m2 | Freq: Once | INTRAVENOUS | Status: AC
  Administered 2011-09-21: 132 mg via INTRAVENOUS
  Filled 2011-09-21: qty 22

## 2011-09-21 NOTE — Progress Notes (Signed)
OFFICE PROGRESS NOTE  CC  [name], MD, MD [facility] [address] [place] [address] Dr. Chipper Herb Dr. Cyndia Bent  DIAGNOSIS: 41 year old with stage IIA (T2NxMx) invasive ductal carcinoma, triple negative, measuring 3.5 x 1.0 cm diagnosed March 2013  PRIOR THERAPY:  1. Seen at [facility] with self palpated breast mass, needle core biopsy positive for IDC, triple negative, stage IIA  2. Genetic testing revealed BRCA I mutation   3. S/P port placement with complication of pneumothorax  4. S/P Neoadjuvant chemotherapy with Adriamycin/cytoxan given dose dense between 07/13/11 - 08/24/11   5. Neoadjuvant carb/taxol begin 09/06/12  X 12 weeks  CURRENT THERAPY:  Taxol/carboplatin weekly , here for week #3/12  INTERVAL HISTORY: Vena Austria 41 y.o. female returns for follow up visit .overall she is doing well. She is now in the process of getting weekly Taxol and carboplatinum for her triple negative breast cancer. She has tolerated the first 2 cycles of her chemotherapy very well. Today she denies any fevers chills night sweats headaches shortness of breath chest pains palpitations no myalgias or arthralgias no peripheral paresthesias. She is continuing to run and is ambulatory. She has no bleeding problems no shortness of breath she is eating well. Remainder of the 10 point review of systems is negative.  MEDICAL HISTORY: Past Medical History  Diagnosis Date  . Breast cancer   . BRCA1 positive 08/17/2011    ALLERGIES:  is allergic to adhesive.  MEDICATIONS:  Current Outpatient Prescriptions  Medication Sig Dispense Refill  . ciprofloxacin (CIPRO) 500 MG tablet Take 1 tablet (500 mg total) by mouth 2 (two) times daily.  20 tablet  1  . dexamethasone (DECADRON) 4 MG tablet Take 4 mg by mouth 2 (two) times daily with a meal.      . dexamethasone (DECADRON) 4 MG tablet Take 2 tablets by mouth two times a day starting the day after  chemotherapy for 3 days.  30 tablet  1  . fluconazole (DIFLUCAN) 200 MG tablet       . HYDROcodone-acetaminophen (NORCO) 5-325 MG per tablet       . lidocaine-prilocaine (EMLA) cream Apply topically as needed.  30 g  3  . loratadine (CLARITIN) 10 MG tablet Take 10 mg by mouth daily.      Marland Kitchen LORazepam (ATIVAN) 0.5 MG tablet Take 1 tablet (0.5 mg total) by mouth every 6 (six) hours as needed (Nausea or vomiting).  30 tablet  0  . Melatonin 1 MG CAPS Take 1 each by mouth.      . NON FORMULARY Smooth Move -Tea      . nystatin (MYCOSTATIN) 100000 UNIT/ML suspension       . ondansetron (ZOFRAN) 8 MG tablet Take 8 mg by mouth every 8 (eight) hours as needed.      . ondansetron (ZOFRAN) 8 MG tablet Take 1 tab two times a day starting the day after chemo for 3 days. Then take 1 tab two times a day as needed for nausea or vomiting.  30 tablet  1  . prochlorperazine (COMPAZINE) 10 MG tablet Take 10 mg by mouth every 6 (six) hours as needed.      . prochlorperazine (COMPAZINE) 10 MG tablet Take 1 tablet (10 mg total) by mouth every 6 (six) hours as needed (Nausea or vomiting).  30 tablet  1  . prochlorperazine (COMPAZINE) 25 MG suppository Place 1 suppository (25 mg total) rectally every 12 (twelve) hours  as needed for nausea.  12 suppository  3  . sucralfate (CARAFATE) 1 G tablet Take 1 tablet (1 g total) by mouth 4 (four) times daily.  90 tablet  0    SURGICAL HISTORY:  Past Surgical History  Procedure Date  . Cesarean section 2003  . Dilation and curettage of uterus 2002  . Portacath placement 06/23/2011    Procedure: INSERTION PORT-A-CATH;  Surgeon: [name], MD;  Location: [facility];  Service: General;  Laterality: N/A;  carm     REVIEW OF SYSTEMS:  Pertinent items are noted in HPI.   PHYSICAL EXAMINATION: General appearance: alert, cooperative, appears stated age, fatigued, flushed and slowed mentation Neck: no adenopathy, no carotid bruit, no JVD, supple, symmetrical,  trachea midline and thyroid not enlarged, symmetric, no tenderness/mass/nodules Lymph nodes: Cervical, supraclavicular, and axillary nodes normal. Resp: clear to auscultation bilaterally and normal percussion bilaterally Back: symmetric, no curvature. ROM normal. No CVA tenderness. Cardio: regular rate and rhythm, S1, S2 normal, no murmur, click, rub or gallop GI: soft, non-tender; bowel sounds normal; no masses,  no organomegaly Extremities: extremities normal, atraumatic, no cyanosis or edema Neurologic: Alert and oriented X 3, normal strength and tone. Normal symmetric reflexes. Normal coordination and gait  ECOG PERFORMANCE STATUS: 1 - Symptomatic but completely ambulatory  Blood pressure 122/81, pulse 98, temperature 97.7 F (36.5 C), temperature source Oral, height 5\' 5"  (1.651 m), weight 140 lb 9.6 oz (63.776 kg).  LABORATORY DATA: Lab Results  Component Value Date   WBC 5.0 09/21/2011   HGB 10.1* 09/21/2011   HCT 28.8* 09/21/2011   MCV 98.6 09/21/2011   PLT 187 09/21/2011      Chemistry      Component Value Date/Time   NA 138 09/14/2011 1046   K 3.9 09/14/2011 1046   CL 103 09/14/2011 1046   CO2 29 09/14/2011 1046   BUN 15 09/14/2011 1046   CREATININE 0.59 09/14/2011 1046      Component Value Date/Time   CALCIUM 8.7 09/14/2011 1046   ALKPHOS 46 09/14/2011 1046   AST 14 09/14/2011 1046   ALT 20 09/14/2011 1046   BILITOT 0.3 09/14/2011 1046       RADIOGRAPHIC STUDIES:  ASSESSMENT: 41 year old female with  #1 stage II A. 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   #2. BRCA1 Mutation Carrier  #3 Patient completed 4 cycles of Neoadjuvant Adriamycin Cytoxan on 08/24/2011.  #4 patient is now on neoadjuvant Taxol and carboplatinum. She is going to be receiving cycle #3 of her treatment. She is tolerating it well. She is having good clinical response.Marland Kitchen  PLAN:  #1proceed with her planned treatment today. She will return in one week's time for cycle #4/12 [employer].  #2 she knows to call with any problems questions or concerns.   #3 patient and I did discuss the possibility that her blood counts especially her ANC could cone down even with this combination that is being given to her weekly. And there may be a need to give her Neupogen to keep her on Time with her treatments.  All questions were answered. The patient knows to call the clinic with any problems, questions or concerns. We can certainly see the patient much sooner if necessary.  I spent 30 minutes counseling the patient face to face. The total time spent in the appointment was 30 minutes.    Drue Second, MD Medical/Oncology [facility] [phone] (beeper) [phone] (Office)  09/21/2011, 11:31  AM

## 2011-09-21 NOTE — Patient Instructions (Signed)
1. Proceed with scheduled chemotherapy today.  2. We will see you back in 1 week

## 2011-09-21 NOTE — Patient Instructions (Signed)
[facility] Discharge Instructions for Patients Receiving Chemotherapy  Today you received the following chemotherapy agents Taxol and Carboplatin  To help prevent nausea and vomiting after your treatment, we encourage you to take your nausea medication  Begin taking it at 7 pm and take it as often as prescribed for the next 24 to 72 hours.   If you develop nausea and vomiting that is not controlled by your nausea medication, call the clinic. If it is after clinic hours your family physician or the after hours number for the clinic or go to the Emergency Department.   BELOW ARE SYMPTOMS THAT SHOULD BE REPORTED IMMEDIATELY:  *FEVER GREATER THAN 100.5 F  *CHILLS WITH OR WITHOUT FEVER  NAUSEA AND VOMITING THAT IS NOT CONTROLLED WITH YOUR NAUSEA MEDICATION  *UNUSUAL SHORTNESS OF BREATH  *UNUSUAL BRUISING OR BLEEDING  TENDERNESS IN MOUTH AND THROAT WITH OR WITHOUT PRESENCE OF ULCERS  *URINARY PROBLEMS  *BOWEL PROBLEMS  UNUSUAL RASH Items with * indicate a potential emergency and should be followed up as soon as possible.  One of the nurses will contact you 24 hours after your treatment. Please let the nurse know about any problems that you may have experienced. Feel free to call the clinic you have any questions or concerns. The clinic phone number is [phone].   I have been informed and understand all the instructions given to me. I know to contact the clinic, my physician, or go to the Emergency Department if any problems should occur. I do not have any questions at this time, but understand that I may call the clinic during office hours   should I have any questions or need assistance in obtaining follow up care.    __________________________________________  _____________  __________ Signature of Patient or Authorized Representative            Date                   Time    __________________________________________ Nurse's Signature    

## 2011-09-28 ENCOUNTER — Ambulatory Visit: Payer: BC Managed Care – PPO

## 2011-09-28 ENCOUNTER — Ambulatory Visit (HOSPITAL_BASED_OUTPATIENT_CLINIC_OR_DEPARTMENT_OTHER): Payer: BC Managed Care – PPO

## 2011-09-28 ENCOUNTER — Ambulatory Visit (HOSPITAL_BASED_OUTPATIENT_CLINIC_OR_DEPARTMENT_OTHER): Payer: BC Managed Care – PPO | Admitting: Oncology

## 2011-09-28 ENCOUNTER — Encounter: Payer: Self-pay | Admitting: Oncology

## 2011-09-28 ENCOUNTER — Other Ambulatory Visit (HOSPITAL_BASED_OUTPATIENT_CLINIC_OR_DEPARTMENT_OTHER): Payer: BC Managed Care – PPO | Admitting: Lab

## 2011-09-28 VITALS — BP 118/72 | HR 80 | Temp 98.4°F | Ht 65.5 in | Wt 139.2 lb

## 2011-09-28 DIAGNOSIS — C50419 Malignant neoplasm of upper-outer quadrant of unspecified female breast: Secondary | ICD-10-CM

## 2011-09-28 DIAGNOSIS — Z1501 Genetic susceptibility to malignant neoplasm of breast: Secondary | ICD-10-CM

## 2011-09-28 DIAGNOSIS — Z5111 Encounter for antineoplastic chemotherapy: Secondary | ICD-10-CM

## 2011-09-28 DIAGNOSIS — R5383 Other fatigue: Secondary | ICD-10-CM

## 2011-09-28 HISTORY — DX: Other fatigue: R53.83

## 2011-09-28 LAB — CBC WITH DIFFERENTIAL/PLATELET
BASO%: 0.7 % (ref 0.0–2.0)
Basophils Absolute: 0 10*3/uL (ref 0.0–0.1)
EOS%: 0.5 % (ref 0.0–7.0)
HGB: 9.7 g/dL — ABNORMAL LOW (ref 11.6–15.9)
MCH: 34.5 pg — ABNORMAL HIGH (ref 25.1–34.0)
MCHC: 35.5 g/dL (ref 31.5–36.0)
RBC: 2.81 10*6/uL — ABNORMAL LOW (ref 3.70–5.45)
RDW: 16.5 % — ABNORMAL HIGH (ref 11.2–14.5)
lymph#: 1 10*3/uL (ref 0.9–3.3)
nRBC: 0 % (ref 0–0)

## 2011-09-28 LAB — COMPREHENSIVE METABOLIC PANEL
ALT: 85 U/L — ABNORMAL HIGH (ref 0–35)
AST: 45 U/L — ABNORMAL HIGH (ref 0–37)
Albumin: 3.8 g/dL (ref 3.5–5.2)
Alkaline Phosphatase: 38 U/L — ABNORMAL LOW (ref 39–117)
Potassium: 3.6 mEq/L (ref 3.5–5.3)
Sodium: 138 mEq/L (ref 135–145)
Total Bilirubin: 0.6 mg/dL (ref 0.3–1.2)
Total Protein: 5.9 g/dL — ABNORMAL LOW (ref 6.0–8.3)

## 2011-09-28 MED ORDER — ONDANSETRON 16 MG/50ML IVPB (CHCC)
16.0000 mg | Freq: Once | INTRAVENOUS | Status: AC
  Administered 2011-09-28: 16 mg via INTRAVENOUS

## 2011-09-28 MED ORDER — FAMOTIDINE IN NACL 20-0.9 MG/50ML-% IV SOLN
20.0000 mg | Freq: Once | INTRAVENOUS | Status: DC
Start: 2011-09-28 — End: 2011-09-28

## 2011-09-28 MED ORDER — DEXAMETHASONE SODIUM PHOSPHATE 4 MG/ML IJ SOLN
20.0000 mg | Freq: Once | INTRAMUSCULAR | Status: AC
  Administered 2011-09-28: 20 mg via INTRAVENOUS

## 2011-09-28 MED ORDER — SODIUM CHLORIDE 0.9 % IJ SOLN
10.0000 mL | INTRAMUSCULAR | Status: DC | PRN
  Administered 2011-09-28: 10 mL
  Filled 2011-09-28: qty 10

## 2011-09-28 MED ORDER — DIPHENHYDRAMINE HCL 50 MG/ML IJ SOLN
25.0000 mg | Freq: Once | INTRAMUSCULAR | Status: AC
  Administered 2011-09-28: 25 mg via INTRAVENOUS

## 2011-09-28 MED ORDER — SODIUM CHLORIDE 0.9 % IV SOLN
Freq: Once | INTRAVENOUS | Status: AC
  Administered 2011-09-28: 13:00:00 via INTRAVENOUS

## 2011-09-28 MED ORDER — HEPARIN SOD (PORK) LOCK FLUSH 100 UNIT/ML IV SOLN
500.0000 [IU] | Freq: Once | INTRAVENOUS | Status: AC | PRN
  Administered 2011-09-28: 500 [IU]
  Filled 2011-09-28: qty 5

## 2011-09-28 MED ORDER — SODIUM CHLORIDE 0.9 % IV SOLN
300.0000 mg | Freq: Once | INTRAVENOUS | Status: AC
  Administered 2011-09-28: 300 mg via INTRAVENOUS
  Filled 2011-09-28: qty 30

## 2011-09-28 MED ORDER — PACLITAXEL CHEMO INJECTION 300 MG/50ML
80.0000 mg/m2 | Freq: Once | INTRAVENOUS | Status: AC
  Administered 2011-09-28: 132 mg via INTRAVENOUS
  Filled 2011-09-28: qty 22

## 2011-09-28 NOTE — Patient Instructions (Addendum)
[facility] Discharge Instructions for Patients Receiving Chemotherapy  Today you received the following chemotherapy agents taxol/cytoxan  To help prevent nausea and vomiting after your treatment, we encourage you to take your nausea medication and take it as often as prescribedIf you develop nausea and vomiting that is not controlled by your nausea medication, call the clinic. If it is after clinic hours your family physician or the after hours number for the clinic or go to the Emergency Department.   BELOW ARE SYMPTOMS THAT SHOULD BE REPORTED IMMEDIATELY:  *FEVER GREATER THAN 100.5 F  *CHILLS WITH OR WITHOUT FEVER  NAUSEA AND VOMITING THAT IS NOT CONTROLLED WITH YOUR NAUSEA MEDICATION  *UNUSUAL SHORTNESS OF BREATH  *UNUSUAL BRUISING OR BLEEDING  TENDERNESS IN MOUTH AND THROAT WITH OR WITHOUT PRESENCE OF ULCERS  *URINARY PROBLEMS  *BOWEL PROBLEMS  UNUSUAL RASH Items with * indicate a potential emergency and should be followed up as soon as possible.  One of the nurses will contact you 24 hours after your treatment. Please let the nurse know about any problems that you may have experienced. Feel free to call the clinic you have any questions or concerns. The clinic phone number is [phone].   I have been informed and understand all the instructions given to me. I know to contact the clinic, my physician, or go to the Emergency Department if any problems should occur. I do not have any questions at this time, but understand that I may call the clinic during office hours   should I have any questions or need assistance in obtaining follow up care.    __________________________________________  _____________  __________ Signature of Patient or Authorized Representative            Date                   Time    __________________________________________ Nurse's Signature    

## 2011-09-28 NOTE — Progress Notes (Signed)
OFFICE PROGRESS NOTE  CC  Stacey Blamer, MD [facility] [address] [place] [address] Stacey Cross Stacey Cross  DIAGNOSIS: 41 year old with stage IIA (T2NxMx) invasive ductal carcinoma, triple negative,  With ki-67 80% measuring 3.5 x 1.0 cm diagnosed March 2013  PRIOR THERAPY:  1. Seen at [facility] with self palpated breast mass, needle core biopsy positive for IDC, triple negative, stage IIA  2. Genetic testing revealed BRCA I mutation   3. S/P port placement with complication of pneumothorax  4. S/P Neoadjuvant chemotherapy with Adriamycin/cytoxan given dose dense between 07/13/11 - 08/24/11   5. Neoadjuvant carb/taxol begin 09/06/12  X 12 weeks  CURRENT THERAPY:  Taxol/carboplatin weekly , here for week #3/12  INTERVAL HISTORY: Stacey Cross 41 y.o. female returns for follow up visit .overall she is doing well. She is now in the process of getting weekly Taxol and carboplatinum for her triple negative breast cancer. She has tolerated the first 3 cycles of her chemotherapy very well. Today she denies any fevers chills night sweats headaches shortness of breath chest pains palpitations no myalgias or arthralgias no peripheral paresthesias. She is continuing to run and is ambulatory. She has no bleeding problems no shortness of breath she is eating well. Patient is slightly anemic likely due to her underlying chemotherapy. But she remains asymptomatic. Patient is also planning on taking some time off from work and broght in her FMLA papers which were given to the folks who  take care of these. I have gone ahead and signed it as well. I do think it is appropriate for her to go on FMLA. Remainder of the 10 point review of systems is negative.  MEDICAL HISTORY: Past Medical History  Diagnosis Date  . Breast cancer   . BRCA1 positive 08/17/2011  . Fatigue 09/28/2011    ALLERGIES:  is allergic to adhesive.  MEDICATIONS:  Current  Outpatient Prescriptions  Medication Sig Dispense Refill  . dexamethasone (DECADRON) 4 MG tablet Take 4 mg by mouth 2 (two) times daily with a meal.      . dexamethasone (DECADRON) 4 MG tablet Take 2 tablets by mouth two times a day starting the day after chemotherapy for 3 days.  30 tablet  1  . fluconazole (DIFLUCAN) 200 MG tablet       . HYDROcodone-acetaminophen (NORCO) 5-325 MG per tablet       . lidocaine-prilocaine (EMLA) cream Apply topically as needed.  30 g  3  . loratadine (CLARITIN) 10 MG tablet Take 10 mg by mouth daily.      Marland Kitchen LORazepam (ATIVAN) 0.5 MG tablet Take 1 tablet (0.5 mg total) by mouth every 6 (six) hours as needed (Nausea or vomiting).  30 tablet  0  . Melatonin 1 MG CAPS Take 1 each by mouth.      . NON FORMULARY Smooth Move -Tea      . nystatin (MYCOSTATIN) 100000 UNIT/ML suspension       . ondansetron (ZOFRAN) 8 MG tablet Take 8 mg by mouth every 8 (eight) hours as needed.      . ondansetron (ZOFRAN) 8 MG tablet Take 1 tab two times a day starting the day after chemo for 3 days. Then take 1 tab two times a day as needed for nausea or vomiting.  30 tablet  1  . prochlorperazine (COMPAZINE) 10 MG tablet Take 10 mg by mouth every 6 (six) hours as needed.      Marland Kitchen  prochlorperazine (COMPAZINE) 10 MG tablet Take 1 tablet (10 mg total) by mouth every 6 (six) hours as needed (Nausea or vomiting).  30 tablet  1  . prochlorperazine (COMPAZINE) 25 MG suppository Place 1 suppository (25 mg total) rectally every 12 (twelve) hours as needed for nausea.  12 suppository  3  . sucralfate (CARAFATE) 1 G tablet Take 1 tablet (1 g total) by mouth 4 (four) times daily.  90 tablet  0    SURGICAL HISTORY:  Past Surgical History  Procedure Date  . Cesarean section 2003  . Dilation and curettage of uterus 2002  . Portacath placement 06/23/2011    Procedure: INSERTION PORT-A-CATH;  Surgeon: Stacey Paris, MD;  Location: [facility];  Service: General;  Laterality: N/A;   carm     REVIEW OF SYSTEMS:  Pertinent items are noted in HPI.   PHYSICAL EXAMINATION: General appearance: alert, cooperative, appears stated age, fatigued, flushed and slowed mentation Neck: no adenopathy, no carotid bruit, no JVD, supple, symmetrical, trachea midline and thyroid not enlarged, symmetric, no tenderness/mass/nodules Lymph nodes: Cervical, supraclavicular, and axillary nodes normal. Resp: clear to auscultation bilaterally and normal percussion bilaterally Back: symmetric, no curvature. ROM normal. No CVA tenderness. Cardio: regular rate and rhythm, S1, S2 normal, no murmur, click, rub or gallop GI: soft, non-tender; bowel sounds normal; no masses,  no organomegaly Extremities: extremities normal, atraumatic, no cyanosis or edema Neurologic: Alert and oriented X 3, normal strength and tone. Normal symmetric reflexes. Normal coordination and gait  ECOG PERFORMANCE STATUS: 1 - Symptomatic but completely ambulatory  Blood pressure 118/72, pulse 80, temperature 98.4 F (36.9 C), temperature source Oral, height 5' 5.5" (1.664 m), weight 139 lb 3.2 oz (63.141 kg).  LABORATORY DATA: Lab Results  Component Value Date   WBC 4.3 09/28/2011   HGB 9.7* 09/28/2011   HCT 27.3* 09/28/2011   MCV 97.2 09/28/2011   PLT 162 09/28/2011      Chemistry      Component Value Date/Time   NA 139 09/21/2011 1056   K 3.5 09/21/2011 1056   CL 106 09/21/2011 1056   CO2 25 09/21/2011 1056   BUN 10 09/21/2011 1056   CREATININE 0.54 09/21/2011 1056      Component Value Date/Time   CALCIUM 8.3* 09/21/2011 1056   ALKPHOS 39 09/21/2011 1056   AST 43* 09/21/2011 1056   ALT 75* 09/21/2011 1056   BILITOT 0.4 09/21/2011 1056       RADIOGRAPHIC STUDIES:  ASSESSMENT: 40 year old female with  #1 stage II A. 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   #2. BRCA1 Mutation Carrier  #3 Patient completed 4 cycles of Neoadjuvant Adriamycin Cytoxan on 08/24/2011.  #4 patient is now on neoadjuvant  Taxol and carboplatinum. She is going to be receiving cycle #4 of her treatment. She is tolerating it well. She is having good clinical response.Marland Kitchen  PLAN:  #1proceed with her planned treatment today. She will return in one week's time for cycle #4/12 [employer].  #2 she knows to call with any problems questions or concerns.   #3 patient and I did discuss the possibility that her blood counts especially her ANC could cone down even with this combination that is being given to her weekly. And there may be a need to give her Neupogen to keep her on Time with her treatments.  All questions were answered. The patient knows to call the clinic with any problems, questions or concerns. We can certainly see  the patient much sooner if necessary.  I spent 30 minutes counseling the patient face to face. The total time spent in the appointment was 30 minutes.    Drue Second, MD Medical/Oncology [facility] [phone] (beeper) [phone] (Office)  09/28/2011, 12:28 PM

## 2011-09-28 NOTE — Patient Instructions (Addendum)
1. Proceed with scheduled chemotherapy  2. Return in 1 week for follow up and chemotherapy

## 2011-09-29 ENCOUNTER — Ambulatory Visit: Payer: BC Managed Care – PPO

## 2011-09-29 ENCOUNTER — Encounter: Payer: Self-pay | Admitting: Oncology

## 2011-09-29 NOTE — Progress Notes (Signed)
Faxed disability paper to [facility] @ [phone].

## 2011-10-05 ENCOUNTER — Encounter: Payer: Self-pay | Admitting: Family

## 2011-10-05 ENCOUNTER — Ambulatory Visit (HOSPITAL_BASED_OUTPATIENT_CLINIC_OR_DEPARTMENT_OTHER): Payer: BC Managed Care – PPO | Admitting: Family

## 2011-10-05 ENCOUNTER — Telehealth: Payer: Self-pay | Admitting: *Deleted

## 2011-10-05 ENCOUNTER — Ambulatory Visit (HOSPITAL_BASED_OUTPATIENT_CLINIC_OR_DEPARTMENT_OTHER): Payer: BC Managed Care – PPO

## 2011-10-05 ENCOUNTER — Other Ambulatory Visit (HOSPITAL_BASED_OUTPATIENT_CLINIC_OR_DEPARTMENT_OTHER): Payer: BC Managed Care – PPO | Admitting: Lab

## 2011-10-05 VITALS — BP 121/73 | HR 80 | Temp 98.4°F | Ht 65.5 in | Wt 138.5 lb

## 2011-10-05 DIAGNOSIS — C50419 Malignant neoplasm of upper-outer quadrant of unspecified female breast: Secondary | ICD-10-CM

## 2011-10-05 DIAGNOSIS — Z5111 Encounter for antineoplastic chemotherapy: Secondary | ICD-10-CM

## 2011-10-05 DIAGNOSIS — C50919 Malignant neoplasm of unspecified site of unspecified female breast: Secondary | ICD-10-CM

## 2011-10-05 DIAGNOSIS — Z1501 Genetic susceptibility to malignant neoplasm of breast: Secondary | ICD-10-CM

## 2011-10-05 DIAGNOSIS — R209 Unspecified disturbances of skin sensation: Secondary | ICD-10-CM

## 2011-10-05 LAB — COMPREHENSIVE METABOLIC PANEL
ALT: 37 U/L — ABNORMAL HIGH (ref 0–35)
AST: 21 U/L (ref 0–37)
Alkaline Phosphatase: 38 U/L — ABNORMAL LOW (ref 39–117)
BUN: 12 mg/dL (ref 6–23)
Creatinine, Ser: 0.58 mg/dL (ref 0.50–1.10)

## 2011-10-05 LAB — CBC WITH DIFFERENTIAL/PLATELET
BASO%: 0.3 % (ref 0.0–2.0)
EOS%: 0.3 % (ref 0.0–7.0)
HCT: 27.3 % — ABNORMAL LOW (ref 34.8–46.6)
LYMPH%: 30.9 % (ref 14.0–49.7)
MCH: 35.1 pg — ABNORMAL HIGH (ref 25.1–34.0)
MCHC: 36.3 g/dL — ABNORMAL HIGH (ref 31.5–36.0)
MONO#: 0.2 10*3/uL (ref 0.1–0.9)
NEUT%: 62.8 % (ref 38.4–76.8)
Platelets: 115 10*3/uL — ABNORMAL LOW (ref 145–400)
RBC: 2.82 10*6/uL — ABNORMAL LOW (ref 3.70–5.45)
WBC: 3.1 10*3/uL — ABNORMAL LOW (ref 3.9–10.3)
lymph#: 1 10*3/uL (ref 0.9–3.3)
nRBC: 0 % (ref 0–0)

## 2011-10-05 MED ORDER — SODIUM CHLORIDE 0.9 % IV SOLN
300.0000 mg | Freq: Once | INTRAVENOUS | Status: AC
  Administered 2011-10-05: 300 mg via INTRAVENOUS
  Filled 2011-10-05: qty 30

## 2011-10-05 MED ORDER — HEPARIN SOD (PORK) LOCK FLUSH 100 UNIT/ML IV SOLN
500.0000 [IU] | Freq: Once | INTRAVENOUS | Status: AC | PRN
  Administered 2011-10-05: 500 [IU]
  Filled 2011-10-05: qty 5

## 2011-10-05 MED ORDER — DEXTROSE 5 % IV SOLN
80.0000 mg/m2 | Freq: Once | INTRAVENOUS | Status: AC
  Administered 2011-10-05: 132 mg via INTRAVENOUS
  Filled 2011-10-05: qty 22

## 2011-10-05 MED ORDER — ONDANSETRON 16 MG/50ML IVPB (CHCC)
16.0000 mg | Freq: Once | INTRAVENOUS | Status: AC
  Administered 2011-10-05: 16 mg via INTRAVENOUS

## 2011-10-05 MED ORDER — DIPHENHYDRAMINE HCL 50 MG/ML IJ SOLN
25.0000 mg | Freq: Once | INTRAMUSCULAR | Status: AC
  Administered 2011-10-05: 25 mg via INTRAVENOUS

## 2011-10-05 MED ORDER — SODIUM CHLORIDE 0.9 % IV SOLN
Freq: Once | INTRAVENOUS | Status: AC
  Administered 2011-10-05: 11:00:00 via INTRAVENOUS

## 2011-10-05 MED ORDER — DEXAMETHASONE SODIUM PHOSPHATE 4 MG/ML IJ SOLN
20.0000 mg | Freq: Once | INTRAMUSCULAR | Status: AC
  Administered 2011-10-05: 20 mg via INTRAVENOUS

## 2011-10-05 MED ORDER — FAMOTIDINE IN NACL 20-0.9 MG/50ML-% IV SOLN
20.0000 mg | Freq: Once | INTRAVENOUS | Status: AC
  Administered 2011-10-05: 20 mg via INTRAVENOUS

## 2011-10-05 MED ORDER — SODIUM CHLORIDE 0.9 % IJ SOLN
10.0000 mL | INTRAMUSCULAR | Status: DC | PRN
  Administered 2011-10-05: 10 mL
  Filled 2011-10-05: qty 10

## 2011-10-05 NOTE — Telephone Encounter (Signed)
Per staff message I have scheduled appts. JMW  

## 2011-10-05 NOTE — Progress Notes (Signed)
[facility]  Name: Stacey Cross                  DATE: 10/05/2011 MRN: [id number]                      DOB: 04-27-1970  REFERRING PHYSICIAN: Levi Aland, MD  DIAGNOSIS: Patient Active Problem List   Diagnosis Date Noted  . Fatigue 09/28/2011  . BRCA1 positive 08/17/2011  . Neutropenia associated with mucositis 08/03/2011  . Cancer of upper-outer quadrant of female breast 06/13/2011     Encounter Diagnosis  Name Primary?  . Breast cancer, triple negative. BRCA 1 positive.  Yes    PREVIOUS THERAPY:  1. Left breast biopsy 06/09/11 2. Bilateral breast MRI 06/14/11. 3. Port a cath placement with subsequent pneumothorax 06/23/11.  4. Baseline echo 06/30/11. 4. PET 07/04/11.   CURRENT THERAPY: Taxol/Carbo weekly, began 09/07/11.    INTERIM HISTORY: Tolerating Taxol/Carbo well. Continues to run daily. Prior to starting chemo, ran 5 miles/day. Tells me she now runs 1 mile/day. Chief complaint is mild fatigue, cumulative with each cycle.   Has not needed antiemetics, using Lorazepam for mild transient nausea. No headache or blurred vision. No cough or shortness of breath. No abdominal pain or new bone pain. Bowel and bladder function are normal. Appetite is good, with adequate fluid intake. Mild paresthesias in the hands and feet and states she doesn't feel "as steady" on her feet since starting chemo. Remainder of the 10 point  review of systems is negative. Discontinued Decadron per her request.   PHYSICAL EXAM: BP 121/73  Pulse 80  Temp 98.4 F (36.9 C) (Oral)  Ht 5' 5.5" (1.664 m)  Wt 138 lb 8 oz (62.823 kg)  BMI 22.70 kg/m2 General: Well developed, well nourished, in no acute distress. Alone at today's visit.   EENT: No ocular or oral lesions. No stomatitis.  Respiratory: Lungs are clear to auscultation bilaterally with normal respiratory movement and no accessory muscle use. Cardiac: No murmur, rub or tachycardia. No upper or lower extremity edema.  GI: Abdomen is  soft, no palpable hepatosplenomegaly. No fluid wave. No tenderness. Musculoskeletal: No kyphosis, no tenderness over the spine, ribs or hips. Lymph: No cervical, infraclavicular, axillary or inguinal adenopathy. Neuro: No focal neurological deficits. Psych: Alert and oriented X 3, appropriate mood and affect.   LABORATORY STUDIES:   Results for orders placed in visit on 10/05/11  CBC WITH DIFFERENTIAL      Component Value Range   WBC 3.1 (*) 3.9 - 10.3 10e3/uL   NEUT# 2.0  1.5 - 6.5 10e3/uL   HGB 9.9 (*) 11.6 - 15.9 g/dL   HCT 34.7 (*) 42.5 - 95.6 %   Platelets 115 (*) 145 - 400 10e3/uL   MCV 96.8  79.5 - 101.0 fL   MCH 35.1 (*) 25.1 - 34.0 pg   MCHC 36.3 (*) 31.5 - 36.0 g/dL   RBC 3.87 (*) 5.64 - 3.32 10e6/uL   RDW 15.7 (*) 11.2 - 14.5 %   lymph# 1.0  0.9 - 3.3 10e3/uL   MONO# 0.2  0.1 - 0.9 10e3/uL   Eosinophils Absolute 0.0  0.0 - 0.5 10e3/uL   Basophils Absolute 0.0  0.0 - 0.1 10e3/uL   NEUT% 62.8  38.4 - 76.8 %   LYMPH% 30.9  14.0 - 49.7 %   MONO% 5.7  0.0 - 14.0 %   EOS% 0.3  0.0 - 7.0 %   BASO% 0.3  0.0 - 2.0 %   nRBC 0  0 - 0 %   IMPRESSION:  41 year old female with:  1. Left breast cancer, BRCA-1 positive, on neoadjuvant chemo. 2. Good tolerance of Taxol/Carbo.  3. Mild transient paresthesias, hands and feet.   PLAN:   1. Return in 1 week for appt with Dr. Welton Flakes, treatment and lab prior. Lab orders are entered for that visit.

## 2011-10-12 ENCOUNTER — Ambulatory Visit (HOSPITAL_BASED_OUTPATIENT_CLINIC_OR_DEPARTMENT_OTHER): Payer: BC Managed Care – PPO

## 2011-10-12 ENCOUNTER — Ambulatory Visit: Payer: BC Managed Care – PPO

## 2011-10-12 ENCOUNTER — Telehealth: Payer: Self-pay | Admitting: Oncology

## 2011-10-12 ENCOUNTER — Ambulatory Visit (HOSPITAL_COMMUNITY)
Admission: RE | Admit: 2011-10-12 | Discharge: 2011-10-12 | Disposition: A | Discharge status: Home / Self Care | Payer: BC Managed Care – PPO | Source: Ambulatory Visit | Attending: Oncology | Admitting: Oncology

## 2011-10-12 ENCOUNTER — Encounter: Payer: Self-pay | Admitting: Oncology

## 2011-10-12 ENCOUNTER — Other Ambulatory Visit: Payer: BC Managed Care – PPO | Admitting: Lab

## 2011-10-12 ENCOUNTER — Encounter (HOSPITAL_COMMUNITY)
Admission: RE | Admit: 2011-10-12 | Discharge: 2011-10-12 | Disposition: A | Discharge status: Home / Self Care | Payer: BC Managed Care – PPO | Source: Ambulatory Visit | Attending: Oncology | Admitting: Oncology

## 2011-10-12 ENCOUNTER — Ambulatory Visit (HOSPITAL_BASED_OUTPATIENT_CLINIC_OR_DEPARTMENT_OTHER): Payer: BC Managed Care – PPO | Admitting: Oncology

## 2011-10-12 VITALS — BP 108/70 | HR 76 | Temp 98.7°F | Resp 18

## 2011-10-12 VITALS — BP 124/76 | HR 76 | Temp 98.3°F | Ht 65.5 in | Wt 138.6 lb

## 2011-10-12 DIAGNOSIS — Z1501 Genetic susceptibility to malignant neoplasm of breast: Secondary | ICD-10-CM

## 2011-10-12 DIAGNOSIS — Z1509 Genetic susceptibility to other malignant neoplasm: Secondary | ICD-10-CM

## 2011-10-12 DIAGNOSIS — D649 Anemia, unspecified: Secondary | ICD-10-CM

## 2011-10-12 DIAGNOSIS — C50419 Malignant neoplasm of upper-outer quadrant of unspecified female breast: Secondary | ICD-10-CM

## 2011-10-12 DIAGNOSIS — R0789 Other chest pain: Secondary | ICD-10-CM

## 2011-10-12 DIAGNOSIS — T451X5A Adverse effect of antineoplastic and immunosuppressive drugs, initial encounter: Secondary | ICD-10-CM

## 2011-10-12 DIAGNOSIS — D6959 Other secondary thrombocytopenia: Secondary | ICD-10-CM

## 2011-10-12 DIAGNOSIS — R5383 Other fatigue: Secondary | ICD-10-CM

## 2011-10-12 DIAGNOSIS — R5381 Other malaise: Secondary | ICD-10-CM

## 2011-10-12 DIAGNOSIS — Z5111 Encounter for antineoplastic chemotherapy: Secondary | ICD-10-CM

## 2011-10-12 HISTORY — DX: Adverse effect of antineoplastic and immunosuppressive drugs, initial encounter: D69.59

## 2011-10-12 HISTORY — DX: Other secondary thrombocytopenia: T45.1X5A

## 2011-10-12 LAB — CBC WITH DIFFERENTIAL/PLATELET
BASO%: 0.4 % (ref 0.0–2.0)
Eosinophils Absolute: 0 10*3/uL (ref 0.0–0.5)
LYMPH%: 38.8 % (ref 14.0–49.7)
MCHC: 36.4 g/dL — ABNORMAL HIGH (ref 31.5–36.0)
MCV: 97.2 fL (ref 79.5–101.0)
MONO%: 5.4 % (ref 0.0–14.0)
Platelets: 119 10*3/uL — ABNORMAL LOW (ref 145–400)
RBC: 2.54 10*6/uL — ABNORMAL LOW (ref 3.70–5.45)
nRBC: 1 % — ABNORMAL HIGH (ref 0–0)

## 2011-10-12 LAB — COMPREHENSIVE METABOLIC PANEL
ALT: 22 U/L (ref 0–35)
CO2: 28 mEq/L (ref 19–32)
Creatinine, Ser: 0.56 mg/dL (ref 0.50–1.10)
Total Bilirubin: 0.8 mg/dL (ref 0.3–1.2)

## 2011-10-12 LAB — ABO/RH: ABO/RH(D): A POS

## 2011-10-12 MED ORDER — ACETAMINOPHEN 325 MG PO TABS
650.0000 mg | ORAL_TABLET | Freq: Once | ORAL | Status: AC
  Administered 2011-10-12: 650 mg via ORAL

## 2011-10-12 MED ORDER — SODIUM CHLORIDE 0.9 % IV SOLN: Freq: Once | INTRAVENOUS | Status: DC

## 2011-10-12 MED ORDER — PACLITAXEL CHEMO INJECTION 300 MG/50ML
80.0000 mg/m2 | Freq: Once | INTRAVENOUS | Status: AC
  Administered 2011-10-12: 132 mg via INTRAVENOUS
  Filled 2011-10-12: qty 22

## 2011-10-12 MED ORDER — DIPHENHYDRAMINE HCL 50 MG/ML IJ SOLN
25.0000 mg | Freq: Once | INTRAMUSCULAR | Status: AC
  Administered 2011-10-12: 25 mg via INTRAVENOUS

## 2011-10-12 MED ORDER — SODIUM CHLORIDE 0.9 % IJ SOLN
10.0000 mL | INTRAMUSCULAR | Status: DC | PRN
  Administered 2011-10-12: 10 mL
  Filled 2011-10-12: qty 10

## 2011-10-12 MED ORDER — FAMOTIDINE IN NACL 20-0.9 MG/50ML-% IV SOLN
20.0000 mg | Freq: Once | INTRAVENOUS | Status: AC
  Administered 2011-10-12: 20 mg via INTRAVENOUS

## 2011-10-12 MED ORDER — DEXAMETHASONE SODIUM PHOSPHATE 4 MG/ML IJ SOLN
20.0000 mg | Freq: Once | INTRAMUSCULAR | Status: AC
  Administered 2011-10-12: 20 mg via INTRAVENOUS

## 2011-10-12 MED ORDER — HEPARIN SOD (PORK) LOCK FLUSH 100 UNIT/ML IV SOLN
500.0000 [IU] | Freq: Once | INTRAVENOUS | Status: AC | PRN
  Administered 2011-10-12: 500 [IU]
  Filled 2011-10-12: qty 5

## 2011-10-12 MED ORDER — ONDANSETRON 16 MG/50ML IVPB (CHCC)
16.0000 mg | Freq: Once | INTRAVENOUS | Status: AC
  Administered 2011-10-12: 16 mg via INTRAVENOUS

## 2011-10-12 NOTE — Patient Instructions (Addendum)
1. Chest x-ray today  2. Blood transfusion today and tomorrow  3. We held the carboplatin today but you will receive the taxol.

## 2011-10-12 NOTE — Progress Notes (Signed)
OFFICE PROGRESS NOTE  CC  Stacey Blamer, MD [facility] [address] [place] [address] Dr. Chipper Herb Dr. Cyndia Bent  DIAGNOSIS: 41 year old with stage IIA (T2NxMx) invasive ductal carcinoma, triple negative,  With ki-67 80% measuring 3.5 x 1.0 cm diagnosed March 2013  PRIOR THERAPY:  1. Seen at [facility] with self palpated breast mass, needle core biopsy positive for IDC, triple negative, stage IIA  2. Genetic testing revealed BRCA I mutation   3. S/P port placement with complication of pneumothorax  4. S/P Neoadjuvant chemotherapy with Adriamycin/cytoxan given dose dense between 07/13/11 - 08/24/11   5. Neoadjuvant carb/taxol begin 09/06/12  X 12 weeks  CURRENT THERAPY:  Taxol (carboplatin to be held today) weekly , here for week #6/12  INTERVAL HISTORY: Stacey Cross 41 y.o. female returns for follow up visit. Patient is visibly fatigued. She denies other chest pains but she does have some chest tightness like somebody is grouping her very tightly. She has not heard any wheezes she denies having any nausea. She has no diarrhea no dominant no pain no fevers chills or night sweats she denies any peripheral paresthesias. She has no bleeding but she is thrombocytopenic as well as very anemic do to her chemotherapy. She has no arthralgias no myalgias. Remainder of the 10 point review of systems is negative MEDICAL HISTORY: Past Medical History  Diagnosis Date  . Breast cancer   . BRCA1 positive 08/17/2011  . Fatigue 09/28/2011  . 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  MEDICATIONS:  Current Outpatient Prescriptions  Medication Sig Dispense Refill  . fluconazole (DIFLUCAN) 200 MG tablet       . HYDROcodone-acetaminophen (NORCO) 5-325 MG per tablet       . lidocaine-prilocaine (EMLA) cream Apply topically as needed.  30 g  3  . loratadine (CLARITIN) 10 MG tablet Take 10 mg by mouth daily.       Marland Kitchen LORazepam (ATIVAN) 0.5 MG tablet Take 1 tablet (0.5 mg total) by mouth every 6 (six) hours as needed (Nausea or vomiting).  30 tablet  0  . Melatonin 1 MG CAPS Take 1 each by mouth.      . NON FORMULARY Smooth Move -Tea      . nystatin (MYCOSTATIN) 100000 UNIT/ML suspension       . ondansetron (ZOFRAN) 8 MG tablet Take 8 mg by mouth every 8 (eight) hours as needed.      . ondansetron (ZOFRAN) 8 MG tablet Take 1 tab two times a day starting the day after chemo for 3 days. Then take 1 tab two times a day as needed for nausea or vomiting.  30 tablet  1  . prochlorperazine (COMPAZINE) 10 MG tablet Take 1 tablet (10 mg total) by mouth every 6 (six) hours as needed (Nausea or vomiting).  30 tablet  1  . prochlorperazine (COMPAZINE) 25 MG suppository Place 1 suppository (25 mg total) rectally every 12 (twelve) hours as needed for nausea.  12 suppository  3  . sucralfate (CARAFATE) 1 G tablet Take 1 tablet (1 g total) by mouth 4 (four) times daily.  90 tablet  0   No current facility-administered medications for this visit.   Facility-Administered Medications Ordered in Other Visits  Medication Dose Route Frequency Provider Last Rate Last Dose  . 0.9 %  sodium chloride infusion   Intravenous Once Victorino December, MD      . dexamethasone (  DECADRON) injection 20 mg  20 mg Intravenous Once Victorino December, MD   20 mg at 10/12/11 1310  . diphenhydrAMINE (BENADRYL) injection 25 mg  25 mg Intravenous Once Victorino December, MD   25 mg at 10/12/11 1310  . famotidine (PEPCID) IVPB 20 mg  20 mg Intravenous Once Victorino December, MD   20 mg at 10/12/11 1327  . heparin lock flush 100 unit/mL  500 Units Intracatheter Once PRN Victorino December, MD      . ondansetron (ZOFRAN) IVPB 16 mg  16 mg Intravenous Once Victorino December, MD   16 mg at 10/12/11 1310  . PACLitaxel (TAXOL) 132 mg in dextrose 5 % 250 mL chemo infusion (</= 80mg /m2)  80 mg/m2 (Treatment Plan Actual) Intravenous Once Victorino December, MD         SURGICAL HISTORY:  Past Surgical History  Procedure Date  . Cesarean section 2003  . Dilation and curettage of uterus 2002  . Portacath placement 06/23/2011    Procedure: INSERTION PORT-A-CATH;  Surgeon: [name], MD;  Location: [facility];  Service: General;  Laterality: N/A;  carm     REVIEW OF SYSTEMS:  Pertinent items are noted in HPI.   PHYSICAL EXAMINATION: General appearance: alert, cooperative, appears stated age, fatigued, flushed and slowed mentation Neck: no adenopathy, no carotid bruit, no JVD, supple, symmetrical, trachea midline and thyroid not enlarged, symmetric, no tenderness/mass/nodules Lymph nodes: Cervical, supraclavicular, and axillary nodes normal. Resp: clear to auscultation bilaterally and normal percussion bilaterally Back: symmetric, no curvature. ROM normal. No CVA tenderness. Cardio: regular rate and rhythm, S1, S2 normal, no murmur, click, rub or gallop GI: soft, non-tender; bowel sounds normal; no masses,  no organomegaly Extremities: extremities normal, atraumatic, no cyanosis or edema Neurologic: Alert and oriented X 3, normal strength and tone. Normal symmetric reflexes. Normal coordination and gait  ECOG PERFORMANCE STATUS: 1 - Symptomatic but completely ambulatory  Blood pressure 124/76, pulse 76, temperature 98.3 F (36.8 C), temperature source Oral, height 5' 5.5" (1.664 m), weight 138 lb 9.6 oz (62.869 kg).  LABORATORY DATA: Lab Results  Component Value Date   WBC 2.8* 10/12/2011   HGB 9.0* 10/12/2011   HCT 24.7* 10/12/2011   MCV 97.2 10/12/2011   PLT 119* 10/12/2011      Chemistry      Component Value Date/Time   NA 138 10/05/2011 0911   K 3.6 10/05/2011 0911   CL 104 10/05/2011 0911   CO2 25 10/05/2011 0911   BUN 12 10/05/2011 0911   CREATININE 0.58 10/05/2011 0911      Component Value Date/Time   CALCIUM 8.6 10/05/2011 0911   ALKPHOS 38* 10/05/2011 0911   AST 21 10/05/2011 0911   ALT 37* 10/05/2011 0911    BILITOT 0.6 10/05/2011 0911       RADIOGRAPHIC STUDIES:  ASSESSMENT: 41 year old female with  #1 stage II A. 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   #2. BRCA1 Mutation Carrier  #3 Patient completed 4 cycles of Neoadjuvant Adriamycin Cytoxan on 08/24/2011.  #4 patient is now on neoadjuvant Taxol and carboplatinum. She is going to be receiving cycle #6 of her treatment.   #5 patient is significantly fatigued may be due to her chemotherapy regimen as well as the anemia. We discussed blood transfusions and modifying the chemotherapy at least for this cycle. Recommended holding carboplatinum for now.  #6 chest tightness rule out pneumothorax possibly could be due to 2 anemia.  #  7 mild thrombocytopenia likely secondary to the chemotherapy specifically carboplatinum but patient has no evidence of bleeding  PLAN:  #1proceed with her planned treatment today. She will return in one week's time for cycle #6/12 with Taxol only today. We are holding her carboplatinum to 2 thrombocytopenia. As well as fatigue.  #2 patient will receive blood transfusion with 2 units. One unit will be administered today and the second tomorrow. Risks and benefits of transfusions were discussed with the patient.  #34 chest tightness I will obtain a chest x-ray PA and lateral. Patient does have a history of an pneumothorax I would like to make sure that she is not having the same thing again.  #5 patient will return in one week's time for cycle #7 of her chemotherapy. At that time we'll decide whether she should get carboplatinum with the Taxol or not.   All questions were answered. The patient knows to call the clinic with any problems, questions or concerns. We can certainly see the patient much sooner if necessary.  I spent 30 minutes counseling the patient face to face. The total time spent in the appointment was 30 minutes.    Drue Second, MD Medical/Oncology [facility] [phone] (beeper) [phone] (Office)  10/12/2011, 1:43 PM

## 2011-10-12 NOTE — Telephone Encounter (Signed)
gve the pt her July-sept 2013 appt calendar along with the chest x-ray referral for today at [facility]

## 2011-10-13 ENCOUNTER — Ambulatory Visit: Payer: BC Managed Care – PPO

## 2011-10-13 LAB — TYPE AND SCREEN
ABO/RH(D): A POS
Antibody Screen: NEGATIVE
Unit division: 0

## 2011-10-13 LAB — PREPARE RBC (CROSSMATCH)

## 2011-10-20 ENCOUNTER — Other Ambulatory Visit (HOSPITAL_BASED_OUTPATIENT_CLINIC_OR_DEPARTMENT_OTHER): Payer: BC Managed Care – PPO | Admitting: Lab

## 2011-10-20 ENCOUNTER — Encounter: Payer: Self-pay | Admitting: Oncology

## 2011-10-20 ENCOUNTER — Ambulatory Visit (HOSPITAL_BASED_OUTPATIENT_CLINIC_OR_DEPARTMENT_OTHER): Payer: BC Managed Care – PPO

## 2011-10-20 ENCOUNTER — Telehealth: Payer: Self-pay | Admitting: *Deleted

## 2011-10-20 ENCOUNTER — Ambulatory Visit (HOSPITAL_BASED_OUTPATIENT_CLINIC_OR_DEPARTMENT_OTHER): Payer: BC Managed Care – PPO | Admitting: Oncology

## 2011-10-20 VITALS — BP 109/77 | HR 70 | Temp 98.2°F | Ht 65.5 in | Wt 140.0 lb

## 2011-10-20 DIAGNOSIS — Z5111 Encounter for antineoplastic chemotherapy: Secondary | ICD-10-CM

## 2011-10-20 DIAGNOSIS — C50419 Malignant neoplasm of upper-outer quadrant of unspecified female breast: Secondary | ICD-10-CM

## 2011-10-20 DIAGNOSIS — Z171 Estrogen receptor negative status [ER-]: Secondary | ICD-10-CM

## 2011-10-20 DIAGNOSIS — Z1501 Genetic susceptibility to malignant neoplasm of breast: Secondary | ICD-10-CM

## 2011-10-20 DIAGNOSIS — D702 Other drug-induced agranulocytosis: Secondary | ICD-10-CM

## 2011-10-20 LAB — CBC WITH DIFFERENTIAL/PLATELET
BASO%: 0 % (ref 0.0–2.0)
EOS%: 0.5 % (ref 0.0–7.0)
HCT: 32.3 % — ABNORMAL LOW (ref 34.8–46.6)
MCH: 34.5 pg — ABNORMAL HIGH (ref 25.1–34.0)
MCHC: 35.6 g/dL (ref 31.5–36.0)
MONO#: 0.2 10*3/uL (ref 0.1–0.9)
NEUT%: 41.2 % (ref 38.4–76.8)
RBC: 3.33 10*6/uL — ABNORMAL LOW (ref 3.70–5.45)
WBC: 2.1 10*3/uL — ABNORMAL LOW (ref 3.9–10.3)
lymph#: 1 10*3/uL (ref 0.9–3.3)
nRBC: 0 % (ref 0–0)

## 2011-10-20 LAB — COMPREHENSIVE METABOLIC PANEL
ALT: 27 U/L (ref 0–35)
AST: 20 U/L (ref 0–37)
Albumin: 4 g/dL (ref 3.5–5.2)
Calcium: 8.8 mg/dL (ref 8.4–10.5)
Chloride: 104 mEq/L (ref 96–112)
Potassium: 3.7 mEq/L (ref 3.5–5.3)
Sodium: 139 mEq/L (ref 135–145)
Total Protein: 5.9 g/dL — ABNORMAL LOW (ref 6.0–8.3)

## 2011-10-20 MED ORDER — DIPHENHYDRAMINE HCL 50 MG/ML IJ SOLN
25.0000 mg | Freq: Once | INTRAMUSCULAR | Status: AC
  Administered 2011-10-20: 25 mg via INTRAVENOUS

## 2011-10-20 MED ORDER — ONDANSETRON 16 MG/50ML IVPB (CHCC)
16.0000 mg | Freq: Once | INTRAVENOUS | Status: AC
  Administered 2011-10-20: 16 mg via INTRAVENOUS

## 2011-10-20 MED ORDER — SODIUM CHLORIDE 0.9 % IV SOLN
300.0000 mg | Freq: Once | INTRAVENOUS | Status: AC
  Administered 2011-10-20: 300 mg via INTRAVENOUS
  Filled 2011-10-20: qty 30

## 2011-10-20 MED ORDER — SODIUM CHLORIDE 0.9 % IV SOLN: Freq: Once | INTRAVENOUS | Status: DC

## 2011-10-20 MED ORDER — DEXAMETHASONE SODIUM PHOSPHATE 4 MG/ML IJ SOLN
20.0000 mg | Freq: Once | INTRAMUSCULAR | Status: AC
  Administered 2011-10-20: 20 mg via INTRAVENOUS

## 2011-10-20 MED ORDER — FAMOTIDINE IN NACL 20-0.9 MG/50ML-% IV SOLN
20.0000 mg | Freq: Once | INTRAVENOUS | Status: AC
  Administered 2011-10-20: 20 mg via INTRAVENOUS

## 2011-10-20 MED ORDER — PACLITAXEL CHEMO INJECTION 300 MG/50ML
80.0000 mg/m2 | Freq: Once | INTRAVENOUS | Status: AC
  Administered 2011-10-20: 132 mg via INTRAVENOUS
  Filled 2011-10-20: qty 22

## 2011-10-20 MED ORDER — HEPARIN SOD (PORK) LOCK FLUSH 100 UNIT/ML IV SOLN
500.0000 [IU] | Freq: Once | INTRAVENOUS | Status: AC | PRN
  Administered 2011-10-20: 500 [IU]
  Filled 2011-10-20: qty 5

## 2011-10-20 MED ORDER — SODIUM CHLORIDE 0.9 % IJ SOLN
10.0000 mL | INTRAMUSCULAR | Status: DC | PRN
  Administered 2011-10-20: 10 mL
  Filled 2011-10-20: qty 10

## 2011-10-20 NOTE — Progress Notes (Signed)
OFFICE PROGRESS NOTE  CC  Stacey Blamer, MD [facility] [address] [place] [address] Dr. Chipper Herb Dr. Cyndia Bent  DIAGNOSIS: 41 year old with stage IIA (T2NxMx) invasive ductal carcinoma, triple negative,  With ki-67 80% measuring 3.5 x 1.0 cm diagnosed March 2013  PRIOR THERAPY:  1. Seen at [facility] with self palpated breast mass, needle core biopsy positive for IDC, triple negative, stage IIA  2. Genetic testing revealed BRCA I mutation   3. S/P port placement with complication of pneumothorax  4. S/P Neoadjuvant chemotherapy with Adriamycin/cytoxan given dose dense between 07/13/11 - 08/24/11   5. Neoadjuvant carb/taxol begin 09/06/12  X 12 weeks  CURRENT THERAPY:  Taxol (carboplatin to be held today) weekly , here for week #7/12  INTERVAL HISTORY: Stacey Cross 41 y.o. female returns for follow up visit. Overall she is feeling well she appreciates having received a blood transfusion. She has no fevers chills night sweats headaches no shortness of breath or chest pains or tightness. No nausea or vomiting no bleeding problems. She has a libido tingling in her fingertips otherwise no myalgias or arthralgias. Remainder of the 10 point review of systems is negative  MEDICAL HISTORY: Past Medical History  Diagnosis Date  . Breast cancer   . BRCA1 positive 08/17/2011  . Fatigue 09/28/2011  . 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  MEDICATIONS:  Current Outpatient Prescriptions  Medication Sig Dispense Refill  . fluconazole (DIFLUCAN) 200 MG tablet       . HYDROcodone-acetaminophen (NORCO) 5-325 MG per tablet       . lidocaine-prilocaine (EMLA) cream Apply topically as needed.  30 g  3  . loratadine (CLARITIN) 10 MG tablet Take 10 mg by mouth daily.      Marland Kitchen LORazepam (ATIVAN) 0.5 MG tablet Take 1 tablet (0.5 mg total) by mouth every 6 (six) hours as needed (Nausea or vomiting).  30  tablet  0  . Melatonin 1 MG CAPS Take 1 each by mouth.      . NON FORMULARY Smooth Move -Tea      . nystatin (MYCOSTATIN) 100000 UNIT/ML suspension       . ondansetron (ZOFRAN) 8 MG tablet Take 8 mg by mouth every 8 (eight) hours as needed.      . ondansetron (ZOFRAN) 8 MG tablet Take 1 tab two times a day starting the day after chemo for 3 days. Then take 1 tab two times a day as needed for nausea or vomiting.  30 tablet  1  . prochlorperazine (COMPAZINE) 10 MG tablet Take 1 tablet (10 mg total) by mouth every 6 (six) hours as needed (Nausea or vomiting).  30 tablet  1  . prochlorperazine (COMPAZINE) 25 MG suppository Place 1 suppository (25 mg total) rectally every 12 (twelve) hours as needed for nausea.  12 suppository  3  . sucralfate (CARAFATE) 1 G tablet Take 1 tablet (1 g total) by mouth 4 (four) times daily.  90 tablet  0    SURGICAL HISTORY:  Past Surgical History  Procedure Date  . Cesarean section 2003  . Dilation and curettage of uterus 2002  . Portacath placement 06/23/2011    Procedure: INSERTION PORT-A-CATH;  Surgeon: [name], MD;  Location: [facility];  Service: General;  Laterality: N/A;  carm     REVIEW OF SYSTEMS:  Pertinent items are noted in HPI.   PHYSICAL EXAMINATION: General appearance:  alert, cooperative, appears stated age, fatigued, flushed and slowed mentation Neck: no adenopathy, no carotid bruit, no JVD, supple, symmetrical, trachea midline and thyroid not enlarged, symmetric, no tenderness/mass/nodules Lymph nodes: Cervical, supraclavicular, and axillary nodes normal. Resp: clear to auscultation bilaterally and normal percussion bilaterally Back: symmetric, no curvature. ROM normal. No CVA tenderness. Cardio: regular rate and rhythm, S1, S2 normal, no murmur, click, rub or gallop GI: soft, non-tender; bowel sounds normal; no masses,  no organomegaly Extremities: extremities normal, atraumatic, no cyanosis or edema Neurologic: Alert  and oriented X 3, normal strength and tone. Normal symmetric reflexes. Normal coordination and gait  ECOG PERFORMANCE STATUS: 1 - Symptomatic but completely ambulatory  Blood pressure 109/77, pulse 70, temperature 98.2 F (36.8 C), temperature source Oral, height 5' 5.5" (1.664 m), weight 140 lb (63.504 kg), last menstrual period 08/12/2011.  LABORATORY DATA: Lab Results  Component Value Date   WBC 2.1* 10/20/2011   HGB 11.5* 10/20/2011   HCT 32.3* 10/20/2011   MCV 97.0 10/20/2011   PLT 146 10/20/2011      Chemistry      Component Value Date/Time   NA 137 10/12/2011 1117   K 3.9 10/12/2011 1117   CL 102 10/12/2011 1117   CO2 28 10/12/2011 1117   BUN 12 10/12/2011 1117   CREATININE 0.56 10/12/2011 1117      Component Value Date/Time   CALCIUM 9.0 10/12/2011 1117   ALKPHOS 40 10/12/2011 1117   AST 18 10/12/2011 1117   ALT 22 10/12/2011 1117   BILITOT 0.8 10/12/2011 1117       RADIOGRAPHIC STUDIES:  ASSESSMENT: 41 year old female with  #1 stage II A. 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   #2. BRCA1 Mutation Carrier  #3 Patient completed 4 cycles of Neoadjuvant Adriamycin Cytoxan on 08/24/2011.  #4 patient is now on neoadjuvant Taxol and carboplatinum. She is going to be receiving cycle #67of her treatment.   #5 patient is status post 2 units of packed red cells which significantly have improved her overall performance status and energy level.  #6 neutropenia do to chemotherapy  PLAN:  #1proceed with her planned treatment today.She will receive Taxol and carboplatinum today. Her ANC is low I have recommended that we do Neupogen to keep her on time.And she concurs.   #2She will return on Saturday Monday Tuesday Wednesday for Neupogen and then on Friday of next week for Taxol and carboplatinum.  All questions were answered. The patient knows to call the clinic with any problems, questions or concerns. We can certainly see the patient much sooner if  necessary.  I spent 30 minutes counseling the patient face to face. The total time spent in the appointment was 30 minutes.    Drue Second, MD Medical/Oncology [facility] [phone] (beeper) [phone] (Office)  10/20/2011, 10:37 AM

## 2011-10-20 NOTE — Telephone Encounter (Signed)
Per orders from 10-20-2011 gave patient injection appointments for 10-21-2011 10-23-2011 10-24-2011 10-25-2011 printed out calendar and gave to the patient

## 2011-10-20 NOTE — Patient Instructions (Addendum)
[facility] Discharge Instructions for Patients Receiving Chemotherapy  Today you received the following chemotherapy agents; Taxol and Carboplatin. To help prevent nausea and vomiting after your treatment, we encourage you to take your nausea medication; Zofran (ondansetron), Compazine (prochloraperzine) and Ativan (lorazepam) as directed for any nausea and/or vomiting.    If you develop nausea and vomiting that is not controlled by your nausea medication, call the clinic. If it is after clinic hours your family physician or the after hours number for the clinic or go to the Emergency Department.   BELOW ARE SYMPTOMS THAT SHOULD BE REPORTED IMMEDIATELY:  *FEVER GREATER THAN 100.5 F  *CHILLS WITH OR WITHOUT FEVER  NAUSEA AND VOMITING THAT IS NOT CONTROLLED WITH YOUR NAUSEA MEDICATION  *UNUSUAL SHORTNESS OF BREATH  *UNUSUAL BRUISING OR BLEEDING  TENDERNESS IN MOUTH AND THROAT WITH OR WITHOUT PRESENCE OF ULCERS  *URINARY PROBLEMS  *BOWEL PROBLEMS  UNUSUAL RASH Items with * indicate a potential emergency and should be followed up as soon as possible.   Feel free to call the clinic you have any questions or concerns. The clinic phone number is [phone].   I have been informed and understand all the instructions given to me. I know to contact the clinic, my physician, or go to the Emergency Department if any problems should occur. I do not have any questions at this time, but understand that I may call the clinic during office hours   should I have any questions or need assistance in obtaining follow up care.    __________________________________________  _____________  __________ Signature of Patient or Authorized Representative            Date                   Time    __________________________________________ Nurse's Signature

## 2011-10-20 NOTE — Patient Instructions (Addendum)
Proceed with chemotherapy today.  Neupogen injections  on 7/6, 7/8, 7/9, 7/10

## 2011-10-21 ENCOUNTER — Ambulatory Visit (HOSPITAL_BASED_OUTPATIENT_CLINIC_OR_DEPARTMENT_OTHER): Payer: BC Managed Care – PPO

## 2011-10-21 VITALS — BP 107/70 | HR 65 | Temp 98.2°F

## 2011-10-21 DIAGNOSIS — D702 Other drug-induced agranulocytosis: Secondary | ICD-10-CM

## 2011-10-21 DIAGNOSIS — C50419 Malignant neoplasm of upper-outer quadrant of unspecified female breast: Secondary | ICD-10-CM

## 2011-10-21 MED ORDER — FILGRASTIM 480 MCG/0.8ML IJ SOLN
480.0000 ug | Freq: Once | INTRAMUSCULAR | Status: AC
  Administered 2011-10-21: 480 ug via SUBCUTANEOUS

## 2011-10-23 ENCOUNTER — Ambulatory Visit (HOSPITAL_BASED_OUTPATIENT_CLINIC_OR_DEPARTMENT_OTHER): Payer: BC Managed Care – PPO

## 2011-10-23 VITALS — BP 119/79 | HR 49 | Temp 97.6°F

## 2011-10-23 DIAGNOSIS — D709 Neutropenia, unspecified: Secondary | ICD-10-CM

## 2011-10-23 DIAGNOSIS — K121 Other forms of stomatitis: Secondary | ICD-10-CM

## 2011-10-23 MED ORDER — FILGRASTIM 480 MCG/0.8ML IJ SOLN
480.0000 ug | Freq: Once | INTRAMUSCULAR | Status: AC
  Administered 2011-10-23: 480 ug via SUBCUTANEOUS
  Filled 2011-10-23: qty 0.8

## 2011-10-24 ENCOUNTER — Ambulatory Visit (HOSPITAL_BASED_OUTPATIENT_CLINIC_OR_DEPARTMENT_OTHER): Payer: BC Managed Care – PPO

## 2011-10-24 VITALS — BP 91/61 | HR 71 | Temp 97.0°F

## 2011-10-24 DIAGNOSIS — K121 Other forms of stomatitis: Secondary | ICD-10-CM

## 2011-10-24 DIAGNOSIS — D709 Neutropenia, unspecified: Secondary | ICD-10-CM

## 2011-10-24 MED ORDER — FILGRASTIM 480 MCG/0.8ML IJ SOLN
480.0000 ug | Freq: Once | INTRAMUSCULAR | Status: AC
  Administered 2011-10-24: 480 ug via SUBCUTANEOUS
  Filled 2011-10-24: qty 0.8

## 2011-10-25 ENCOUNTER — Ambulatory Visit (HOSPITAL_BASED_OUTPATIENT_CLINIC_OR_DEPARTMENT_OTHER): Payer: BC Managed Care – PPO

## 2011-10-25 VITALS — BP 95/69 | HR 76 | Temp 99.0°F

## 2011-10-25 DIAGNOSIS — D702 Other drug-induced agranulocytosis: Secondary | ICD-10-CM

## 2011-10-25 DIAGNOSIS — C50419 Malignant neoplasm of upper-outer quadrant of unspecified female breast: Secondary | ICD-10-CM

## 2011-10-25 MED ORDER — FILGRASTIM 480 MCG/0.8ML IJ SOLN
480.0000 ug | Freq: Once | INTRAMUSCULAR | Status: AC
  Administered 2011-10-25: 480 ug via SUBCUTANEOUS
  Filled 2011-10-25: qty 0.8

## 2011-10-27 ENCOUNTER — Ambulatory Visit (HOSPITAL_BASED_OUTPATIENT_CLINIC_OR_DEPARTMENT_OTHER): Payer: BC Managed Care – PPO | Admitting: Oncology

## 2011-10-27 ENCOUNTER — Encounter: Payer: Self-pay | Admitting: Oncology

## 2011-10-27 ENCOUNTER — Other Ambulatory Visit (HOSPITAL_BASED_OUTPATIENT_CLINIC_OR_DEPARTMENT_OTHER): Payer: BC Managed Care – PPO

## 2011-10-27 ENCOUNTER — Ambulatory Visit (HOSPITAL_BASED_OUTPATIENT_CLINIC_OR_DEPARTMENT_OTHER): Payer: BC Managed Care – PPO

## 2011-10-27 ENCOUNTER — Telehealth: Payer: Self-pay | Admitting: *Deleted

## 2011-10-27 VITALS — BP 112/73 | HR 78 | Temp 98.2°F | Ht 65.5 in | Wt 142.0 lb

## 2011-10-27 DIAGNOSIS — Z5111 Encounter for antineoplastic chemotherapy: Secondary | ICD-10-CM

## 2011-10-27 DIAGNOSIS — Z171 Estrogen receptor negative status [ER-]: Secondary | ICD-10-CM

## 2011-10-27 DIAGNOSIS — C50419 Malignant neoplasm of upper-outer quadrant of unspecified female breast: Secondary | ICD-10-CM

## 2011-10-27 DIAGNOSIS — Z1501 Genetic susceptibility to malignant neoplasm of breast: Secondary | ICD-10-CM

## 2011-10-27 DIAGNOSIS — Z1509 Genetic susceptibility to other malignant neoplasm: Secondary | ICD-10-CM

## 2011-10-27 LAB — CBC WITH DIFFERENTIAL/PLATELET
Basophils Absolute: 0 10*3/uL (ref 0.0–0.1)
Eosinophils Absolute: 0 10*3/uL (ref 0.0–0.5)
HCT: 31 % — ABNORMAL LOW (ref 34.8–46.6)
HGB: 11.1 g/dL — ABNORMAL LOW (ref 11.6–15.9)
LYMPH%: 21.3 % (ref 14.0–49.7)
MCV: 98.1 fL (ref 79.5–101.0)
MONO%: 16.3 % — ABNORMAL HIGH (ref 0.0–14.0)
NEUT#: 4.5 10*3/uL (ref 1.5–6.5)
Platelets: 147 10*3/uL (ref 145–400)

## 2011-10-27 LAB — COMPREHENSIVE METABOLIC PANEL
Albumin: 4 g/dL (ref 3.5–5.2)
Alkaline Phosphatase: 70 U/L (ref 39–117)
BUN: 12 mg/dL (ref 6–23)
Glucose, Bld: 83 mg/dL (ref 70–99)
Potassium: 3.6 mEq/L (ref 3.5–5.3)

## 2011-10-27 MED ORDER — DEXAMETHASONE SODIUM PHOSPHATE 4 MG/ML IJ SOLN
20.0000 mg | Freq: Once | INTRAMUSCULAR | Status: AC
  Administered 2011-10-27: 20 mg via INTRAVENOUS

## 2011-10-27 MED ORDER — SODIUM CHLORIDE 0.9 % IV SOLN
Freq: Once | INTRAVENOUS | Status: AC
  Administered 2011-10-27: 14:00:00 via INTRAVENOUS

## 2011-10-27 MED ORDER — SODIUM CHLORIDE 0.9 % IV SOLN
300.0000 mg | Freq: Once | INTRAVENOUS | Status: AC
  Administered 2011-10-27: 300 mg via INTRAVENOUS
  Filled 2011-10-27: qty 30

## 2011-10-27 MED ORDER — DIPHENHYDRAMINE HCL 50 MG/ML IJ SOLN
25.0000 mg | Freq: Once | INTRAMUSCULAR | Status: AC
  Administered 2011-10-27: 25 mg via INTRAVENOUS

## 2011-10-27 MED ORDER — FAMOTIDINE IN NACL 20-0.9 MG/50ML-% IV SOLN
20.0000 mg | Freq: Once | INTRAVENOUS | Status: AC
  Administered 2011-10-27: 20 mg via INTRAVENOUS

## 2011-10-27 MED ORDER — PACLITAXEL CHEMO INJECTION 300 MG/50ML
80.0000 mg/m2 | Freq: Once | INTRAVENOUS | Status: AC
  Administered 2011-10-27: 132 mg via INTRAVENOUS
  Filled 2011-10-27: qty 22

## 2011-10-27 MED ORDER — ONDANSETRON 16 MG/50ML IVPB (CHCC)
16.0000 mg | Freq: Once | INTRAVENOUS | Status: AC
  Administered 2011-10-27: 16 mg via INTRAVENOUS

## 2011-10-27 NOTE — Patient Instructions (Addendum)
1. Proceed with chemotherapy today.  2. Return in 1 week for next cycle of taxol/carboplatin

## 2011-10-27 NOTE — Progress Notes (Signed)
OFFICE PROGRESS NOTE  CC  Stacey Blamer, MD [facility] 1 [address] [place] [address] Dr. Chipper Cross Dr. Cyndia Cross  DIAGNOSIS: 41 year old with stage IIA (T2NxMx) invasive ductal carcinoma, triple negative,  With ki-67 80% measuring 3.5 x 1.0 cm diagnosed March 2013  PRIOR THERAPY:  1. Seen at [facility] with self palpated breast mass, needle core biopsy positive for IDC, triple negative, stage IIA  2. Genetic testing revealed BRCA I mutation   3. S/P port placement with complication of pneumothorax  4. S/P Neoadjuvant chemotherapy with Adriamycin/cytoxan given dose dense between 07/13/11 - 08/24/11   5. Neoadjuvant carbo/taxol begin 09/06/12  X 12 weeks  CURRENT THERAPY:  Carbo/taxol cycle 8  INTERVAL HISTORY: Stacey Cross 41 y.o. female returns for follow up visit. Overall patient is doing well. She does have some complaints of having spots. Otherwise she denies any fevers chills night sweats headaches no shortness of breath no chest pains no palpitations no myalgias or arthralgias no peripheral paresthesias. She did receive Neupogen with her last cycle do to neutropenia. This has now resolved. For her next few cycles we will not be giving her Neupogen. Remainder of the 10 point review of systems is negative.  MEDICAL HISTORY: Past Medical History  Diagnosis Date  . Breast cancer   . BRCA1 positive 08/17/2011  . Fatigue 09/28/2011  . 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  MEDICATIONS:  Current Outpatient Prescriptions  Medication Sig Dispense Refill  . fluconazole (DIFLUCAN) 200 MG tablet       . HYDROcodone-acetaminophen (NORCO) 5-325 MG per tablet       . lidocaine-prilocaine (EMLA) cream Apply topically as needed.  30 g  3  . loratadine (CLARITIN) 10 MG tablet Take 10 mg by mouth daily.      Marland Kitchen LORazepam (ATIVAN) 0.5 MG tablet Take 1 tablet (0.5 mg total) by mouth every 6  (six) hours as needed (Nausea or vomiting).  30 tablet  0  . Melatonin 1 MG CAPS Take 1 each by mouth.      . NON FORMULARY Smooth Move -Tea      . nystatin (MYCOSTATIN) 100000 UNIT/ML suspension       . ondansetron (ZOFRAN) 8 MG tablet Take 8 mg by mouth every 8 (eight) hours as needed.      . ondansetron (ZOFRAN) 8 MG tablet Take 1 tab two times a day starting the day after chemo for 3 days. Then take 1 tab two times a day as needed for nausea or vomiting.  30 tablet  1  . prochlorperazine (COMPAZINE) 10 MG tablet Take 1 tablet (10 mg total) by mouth every 6 (six) hours as needed (Nausea or vomiting).  30 tablet  1  . prochlorperazine (COMPAZINE) 25 MG suppository Place 1 suppository (25 mg total) rectally every 12 (twelve) hours as needed for nausea.  12 suppository  3  . sucralfate (CARAFATE) 1 G tablet Take 1 tablet (1 g total) by mouth 4 (four) times daily.  90 tablet  0    SURGICAL HISTORY:  Past Surgical History  Procedure Date  . Cesarean section 2003  . Dilation and curettage of uterus 2002  . Portacath placement 06/23/2011    Procedure: INSERTION PORT-A-CATH;  Surgeon: Stacey Paris, MD;  Location: [facility];  Service: General;  Laterality: N/A;  carm     REVIEW OF SYSTEMS:  Pertinent items are noted in  HPI.   PHYSICAL EXAMINATION: General appearance: alert, cooperative, appears stated age, fatigued, flushed and slowed mentation Neck: no adenopathy, no carotid bruit, no JVD, supple, symmetrical, trachea midline and thyroid not enlarged, symmetric, no tenderness/mass/nodules Lymph nodes: Cervical, supraclavicular, and axillary nodes normal. Resp: clear to auscultation bilaterally and normal percussion bilaterally Back: symmetric, no curvature. ROM normal. No CVA tenderness. Cardio: regular rate and rhythm, S1, S2 normal, no murmur, click, rub or gallop GI: soft, non-tender; bowel sounds normal; no masses,  no organomegaly Extremities: extremities normal,  atraumatic, no cyanosis or edema Neurologic: Alert and oriented X 3, normal strength and tone. Normal symmetric reflexes. Normal coordination and gait  ECOG PERFORMANCE STATUS: 1 - Symptomatic but completely ambulatory  Blood pressure 112/73, pulse 78, temperature 98.2 F (36.8 C), temperature source Oral, height 5' 5.5" (1.664 m), weight 142 lb (64.411 kg), last menstrual period 08/12/2011.  LABORATORY DATA: Lab Results  Component Value Date   WBC 7.2 10/27/2011   HGB 11.1* 10/27/2011   HCT 31.0* 10/27/2011   MCV 98.1 10/27/2011   PLT 147 10/27/2011      Chemistry      Component Value Date/Time   NA 139 10/20/2011 0926   K 3.7 10/20/2011 0926   CL 104 10/20/2011 0926   CO2 28 10/20/2011 0926   BUN 8 10/20/2011 0926   CREATININE 0.54 10/20/2011 0926      Component Value Date/Time   CALCIUM 8.8 10/20/2011 0926   ALKPHOS 36* 10/20/2011 0926   AST 20 10/20/2011 0926   ALT 27 10/20/2011 0926   BILITOT 0.4 10/20/2011 0926       RADIOGRAPHIC STUDIES:  ASSESSMENT: 41 year old female with  #1 stage II A. 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   #2. BRCA1 Mutation Carrier  #3 Patient completed 4 cycles of Neoadjuvant Adriamycin Cytoxan on 08/24/2011.  #4 patient is now on neoadjuvant Taxol and carboplatinum. She is going to be receiving cycle # 8of her treatment.   PLAN:  #1 Patient will proceed with cycle #8/12 of carbo Taxol.  #2 we will refer her back to Dr. Jamey Cross as she is gong to be finishing up her chemotherapy on 11/24/2011. Because patient is BRCA1 positive she is planning on having bilateral mastectomies and arrangements for reconstruction need to be made as well. Thus the sooner we can get her back to Dr. Jamey Cross the quickly we can make these arrangements for her surgery.  #3 patient will be seen back in one week's time for cycle #9/12.  All questions were answered. The patient knows to call the clinic with any problems, questions or concerns. We can certainly  see the patient much sooner if necessary.  I spent 30 minutes counseling the patient face to face. The total time spent in the appointment was 30 minutes.    Stacey Second, MD Medical/Oncology [facility] [phone] (beeper) [phone] (Office)  10/27/2011, 1:16 PM

## 2011-10-27 NOTE — Telephone Encounter (Signed)
Made patient appointment with dr.streck on 11-14-2011 at 8:30am

## 2011-10-30 ENCOUNTER — Telehealth (INDEPENDENT_AMBULATORY_CARE_PROVIDER_SITE_OTHER): Payer: Self-pay | Admitting: General Surgery

## 2011-10-30 NOTE — Telephone Encounter (Signed)
Spoke with patient to determine which surgery she has decided on. She wants to proceed with bilateral mastectomies with reconstruction to coordinate with Dr Odis Luster. 11/24/11 is her last chemo treatment and she would like to have surgery after 12/06/11 when her kids are back in school. I let her know I would get this information to Dr Jamey Ripa and he would write orders and get this information to our scheduling department.

## 2011-10-31 ENCOUNTER — Other Ambulatory Visit (INDEPENDENT_AMBULATORY_CARE_PROVIDER_SITE_OTHER): Payer: Self-pay | Admitting: Surgery

## 2011-10-31 DIAGNOSIS — C50912 Malignant neoplasm of unspecified site of left female breast: Secondary | ICD-10-CM

## 2011-11-03 ENCOUNTER — Encounter: Payer: Self-pay | Admitting: Oncology

## 2011-11-03 ENCOUNTER — Ambulatory Visit (HOSPITAL_BASED_OUTPATIENT_CLINIC_OR_DEPARTMENT_OTHER): Payer: BC Managed Care – PPO | Admitting: Oncology

## 2011-11-03 ENCOUNTER — Other Ambulatory Visit (HOSPITAL_BASED_OUTPATIENT_CLINIC_OR_DEPARTMENT_OTHER): Payer: BC Managed Care – PPO | Admitting: Lab

## 2011-11-03 ENCOUNTER — Ambulatory Visit (HOSPITAL_BASED_OUTPATIENT_CLINIC_OR_DEPARTMENT_OTHER): Payer: BC Managed Care – PPO

## 2011-11-03 VITALS — BP 121/81 | HR 74 | Temp 98.3°F | Ht 65.5 in | Wt 139.1 lb

## 2011-11-03 DIAGNOSIS — C50419 Malignant neoplasm of upper-outer quadrant of unspecified female breast: Secondary | ICD-10-CM

## 2011-11-03 DIAGNOSIS — Z1501 Genetic susceptibility to malignant neoplasm of breast: Secondary | ICD-10-CM

## 2011-11-03 DIAGNOSIS — Z5111 Encounter for antineoplastic chemotherapy: Secondary | ICD-10-CM

## 2011-11-03 DIAGNOSIS — Z171 Estrogen receptor negative status [ER-]: Secondary | ICD-10-CM

## 2011-11-03 LAB — COMPREHENSIVE METABOLIC PANEL
Albumin: 4 g/dL (ref 3.5–5.2)
CO2: 25 mEq/L (ref 19–32)
Chloride: 101 mEq/L (ref 96–112)
Glucose, Bld: 80 mg/dL (ref 70–99)
Potassium: 3.8 mEq/L (ref 3.5–5.3)
Sodium: 135 mEq/L (ref 135–145)
Total Protein: 5.8 g/dL — ABNORMAL LOW (ref 6.0–8.3)

## 2011-11-03 LAB — CBC WITH DIFFERENTIAL/PLATELET
Eosinophils Absolute: 0 10*3/uL (ref 0.0–0.5)
LYMPH%: 28.4 % (ref 14.0–49.7)
MONO#: 0.2 10*3/uL (ref 0.1–0.9)
NEUT#: 2.9 10*3/uL (ref 1.5–6.5)
Platelets: 180 10*3/uL (ref 145–400)
RBC: 3.2 10*6/uL — ABNORMAL LOW (ref 3.70–5.45)
RDW: 15.2 % — ABNORMAL HIGH (ref 11.2–14.5)
WBC: 4.3 10*3/uL (ref 3.9–10.3)
lymph#: 1.2 10*3/uL (ref 0.9–3.3)

## 2011-11-03 MED ORDER — DEXAMETHASONE SODIUM PHOSPHATE 4 MG/ML IJ SOLN
20.0000 mg | Freq: Once | INTRAMUSCULAR | Status: AC
  Administered 2011-11-03: 20 mg via INTRAVENOUS

## 2011-11-03 MED ORDER — DIPHENHYDRAMINE HCL 50 MG/ML IJ SOLN
25.0000 mg | Freq: Once | INTRAMUSCULAR | Status: AC
  Administered 2011-11-03: 25 mg via INTRAVENOUS

## 2011-11-03 MED ORDER — HEPARIN SOD (PORK) LOCK FLUSH 100 UNIT/ML IV SOLN
500.0000 [IU] | Freq: Once | INTRAVENOUS | Status: AC | PRN
  Administered 2011-11-03: 500 [IU]
  Filled 2011-11-03: qty 5

## 2011-11-03 MED ORDER — SODIUM CHLORIDE 0.9 % IJ SOLN
10.0000 mL | INTRAMUSCULAR | Status: DC | PRN
  Administered 2011-11-03: 10 mL
  Filled 2011-11-03: qty 10

## 2011-11-03 MED ORDER — PACLITAXEL CHEMO INJECTION 300 MG/50ML
80.0000 mg/m2 | Freq: Once | INTRAVENOUS | Status: AC
  Administered 2011-11-03: 132 mg via INTRAVENOUS
  Filled 2011-11-03: qty 22

## 2011-11-03 MED ORDER — GABAPENTIN 300 MG PO CAPS: 300.0000 mg | ORAL_CAPSULE | Freq: Every day | ORAL | Status: DC

## 2011-11-03 MED ORDER — SODIUM CHLORIDE 0.9 % IV SOLN
Freq: Once | INTRAVENOUS | Status: AC
  Administered 2011-11-03: 10:00:00 via INTRAVENOUS

## 2011-11-03 MED ORDER — SODIUM CHLORIDE 0.9 % IV SOLN
200.0000 mg | Freq: Once | INTRAVENOUS | Status: AC
  Administered 2011-11-03: 200 mg via INTRAVENOUS
  Filled 2011-11-03: qty 20

## 2011-11-03 MED ORDER — ONDANSETRON 16 MG/50ML IVPB (CHCC)
16.0000 mg | Freq: Once | INTRAVENOUS | Status: AC
  Administered 2011-11-03: 16 mg via INTRAVENOUS

## 2011-11-03 MED ORDER — FAMOTIDINE IN NACL 20-0.9 MG/50ML-% IV SOLN
20.0000 mg | Freq: Once | INTRAVENOUS | Status: AC
  Administered 2011-11-03: 20 mg via INTRAVENOUS

## 2011-11-03 NOTE — Patient Instructions (Addendum)
1. Proceed with cycle 12 of taxol and carboplatin  2. 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 I will see you back in 1 weeks

## 2011-11-03 NOTE — Patient Instructions (Signed)
[facility] Discharge Instructions for Patients Receiving Chemotherapy  Today you received the following chemotherapy agents Taxol and Carboplatin.  To help prevent nausea and vomiting after your treatment, we encourage you to take your nausea medication as prescribed.   If you develop nausea and vomiting that is not controlled by your nausea medication, call the clinic. If it is after clinic hours your family physician or the after hours number for the clinic or go to the Emergency Department.   BELOW ARE SYMPTOMS THAT SHOULD BE REPORTED IMMEDIATELY:  *FEVER GREATER THAN 100.5 F  *CHILLS WITH OR WITHOUT FEVER  NAUSEA AND VOMITING THAT IS NOT CONTROLLED WITH YOUR NAUSEA MEDICATION  *UNUSUAL SHORTNESS OF BREATH  *UNUSUAL BRUISING OR BLEEDING  TENDERNESS IN MOUTH AND THROAT WITH OR WITHOUT PRESENCE OF ULCERS  *URINARY PROBLEMS  *BOWEL PROBLEMS  UNUSUAL RASH Items with * indicate a potential emergency and should be followed up as soon as possible.  One of the nurses will contact you 24 hours after your treatment. Please let the nurse know about any problems that you may have experienced. Feel free to call the clinic you have any questions or concerns. The clinic phone number is [phone].   I have been informed and understand all the instructions given to me. I know to contact the clinic, my physician, or go to the Emergency Department if any problems should occur. I do not have any questions at this time, but understand that I may call the clinic during office hours   should I have any questions or need assistance in obtaining follow up care.    __________________________________________  _____________  __________ Signature of Patient or Authorized Representative            Date                   Time    __________________________________________ Nurse's Signature    

## 2011-11-03 NOTE — Progress Notes (Signed)
OFFICE PROGRESS NOTE  CC  Stacey Blamer, MD [facility] [address] [place] [address] Dr. Chipper Herb Dr. Cyndia Bent  DIAGNOSIS: 41 year old with stage IIA (T2NxMx) invasive ductal carcinoma, triple negative,  With ki-67 80% measuring 3.5 x 1.0 cm diagnosed March 2013  PRIOR THERAPY:  1. Seen at [facility] with self palpated breast mass, needle core biopsy positive for IDC, triple negative, stage IIA  2. Genetic testing revealed BRCA I mutation   3. S/P port placement with complication of pneumothorax  4. S/P Neoadjuvant chemotherapy with Adriamycin/cytoxan given dose dense between 07/13/11 - 08/24/11   5. Neoadjuvant carbo/taxol begin 09/06/12  X 12 weeks  CURRENT THERAPY:  Carbo/taxol cycle 9  INTERVAL HISTORY: Stacey Cross 41 y.o. female returns for follow up visit. Overall she is doing well she is continuing to run on a daily basis but is doing 1 mile a day. She however is beginning to feel neuropathic pain in her feet. She also is a little bit of balance most likely due to the neuropathy. She otherwise denies any nausea vomiting fevers chills night sweats headaches shortness of breath chest pains or palpitations. She has no arthralgias or myalgias. Her white count looks good today and thus we will proceed with her chemotherapy. Remainder of the 10 point review of systems is negative. MEDICAL HISTORY: Past Medical History  Diagnosis Date  . Breast cancer   . BRCA1 positive 08/17/2011  . Fatigue 09/28/2011  . 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  MEDICATIONS:  Current Outpatient Prescriptions  Medication Sig Dispense Refill  . fluconazole (DIFLUCAN) 200 MG tablet       . HYDROcodone-acetaminophen (NORCO) 5-325 MG per tablet       . lidocaine-prilocaine (EMLA) cream Apply topically as needed.  30 g  3  . loratadine (CLARITIN) 10 MG tablet Take 10 mg by mouth daily.      Marland Kitchen  LORazepam (ATIVAN) 0.5 MG tablet Take 1 tablet (0.5 mg total) by mouth every 6 (six) hours as needed (Nausea or vomiting).  30 tablet  0  . Melatonin 1 MG CAPS Take 1 each by mouth.      . NON FORMULARY Smooth Move -Tea      . nystatin (MYCOSTATIN) 100000 UNIT/ML suspension       . ondansetron (ZOFRAN) 8 MG tablet Take 8 mg by mouth every 8 (eight) hours as needed.      . ondansetron (ZOFRAN) 8 MG tablet Take 1 tab two times a day starting the day after chemo for 3 days. Then take 1 tab two times a day as needed for nausea or vomiting.  30 tablet  1  . prochlorperazine (COMPAZINE) 10 MG tablet Take 1 tablet (10 mg total) by mouth every 6 (six) hours as needed (Nausea or vomiting).  30 tablet  1  . prochlorperazine (COMPAZINE) 25 MG suppository Place 1 suppository (25 mg total) rectally every 12 (twelve) hours as needed for nausea.  12 suppository  3  . sucralfate (CARAFATE) 1 G tablet Take 1 tablet (1 g total) by mouth 4 (four) times daily.  90 tablet  0    SURGICAL HISTORY:  Past Surgical History  Procedure Date  . Cesarean section 2003  . Dilation and curettage of uterus 2002  . Portacath placement 06/23/2011    Procedure: INSERTION PORT-A-CATH;  Surgeon: [name], MD;  Location: [facility];  Service: General;  Laterality: N/A;  carm     REVIEW OF SYSTEMS:  Pertinent items are noted in HPI.   PHYSICAL EXAMINATION: General appearance: alert, cooperative, appears stated age, fatigued, flushed and slowed mentation Neck: no adenopathy, no carotid bruit, no JVD, supple, symmetrical, trachea midline and thyroid not enlarged, symmetric, no tenderness/mass/nodules Lymph nodes: Cervical, supraclavicular, and axillary nodes normal. Resp: clear to auscultation bilaterally and normal percussion bilaterally Back: symmetric, no curvature. ROM normal. No CVA tenderness. Cardio: regular rate and rhythm, S1, S2 normal, no murmur, click, rub or gallop GI: soft, non-tender; bowel  sounds normal; no masses,  no organomegaly Extremities: extremities normal, atraumatic, no cyanosis or edema Neurologic: Alert and oriented X 3, normal strength and tone. Normal symmetric reflexes. Normal coordination and gait  ECOG PERFORMANCE STATUS: 1 - Symptomatic but completely ambulatory  Blood pressure 121/81, pulse 74, temperature 98.3 F (36.8 C), temperature source Oral, height 5' 5.5" (1.664 m), weight 139 lb 1.6 oz (63.095 kg), last menstrual period 08/12/2011.  LABORATORY DATA: Lab Results  Component Value Date   WBC 4.3 11/03/2011   HGB 11.2* 11/03/2011   HCT 30.8* 11/03/2011   MCV 96.3 11/03/2011   PLT 180 11/03/2011      Chemistry      Component Value Date/Time   NA 139 10/27/2011 1240   K 3.6 10/27/2011 1240   CL 102 10/27/2011 1240   CO2 30 10/27/2011 1240   BUN 12 10/27/2011 1240   CREATININE 0.99 10/27/2011 1240      Component Value Date/Time   CALCIUM 8.4 10/27/2011 1240   ALKPHOS 70 10/27/2011 1240   AST 18 10/27/2011 1240   ALT 28 10/27/2011 1240   BILITOT 0.4 10/27/2011 1240       RADIOGRAPHIC STUDIES:  ASSESSMENT: 41 year old female with  #1 stage II A. 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   #2. BRCA1 Mutation Carrier  #3 Patient completed 4 cycles of Neoadjuvant Adriamycin Cytoxan on 08/24/2011.  #4 patient is now on neoadjuvant Taxol and carboplatinum. She is going to be receiving cycle # 9 of her treatment.   PLAN:   #1 Patient will proceed with cycle #9/12 of carbo Taxol.  #2 we will refer her back to Dr. Jamey Ripa as she is gong to be finishing up her chemotherapy on 11/24/2011. Because patient is BRCA1 positive she is planning on having bilateral mastectomies and arrangements for reconstruction need to be made as well. Thus the sooner we can get her back to Dr. Jamey Ripa the quickly we can make these arrangements for her surgery.  #3 patient will be seen back in one week's time for cycle #10/12.  All questions were answered.  The patient knows to call the clinic with any problems, questions or concerns. We can certainly see the patient much sooner if necessary.  I spent 30 minutes counseling the patient face to face. The total time spent in the appointment was 30 minutes.    Drue Second, MD Medical/Oncology [facility] [phone] (beeper) [phone] (Office)  11/03/2011, 9:29 AM

## 2011-11-10 ENCOUNTER — Other Ambulatory Visit: Payer: Self-pay | Admitting: Medical Oncology

## 2011-11-10 ENCOUNTER — Ambulatory Visit (HOSPITAL_BASED_OUTPATIENT_CLINIC_OR_DEPARTMENT_OTHER): Payer: BC Managed Care – PPO

## 2011-11-10 ENCOUNTER — Encounter: Payer: Self-pay | Admitting: Oncology

## 2011-11-10 ENCOUNTER — Ambulatory Visit (HOSPITAL_BASED_OUTPATIENT_CLINIC_OR_DEPARTMENT_OTHER): Payer: BC Managed Care – PPO | Admitting: Oncology

## 2011-11-10 ENCOUNTER — Telehealth: Payer: Self-pay | Admitting: Oncology

## 2011-11-10 ENCOUNTER — Other Ambulatory Visit (HOSPITAL_BASED_OUTPATIENT_CLINIC_OR_DEPARTMENT_OTHER): Payer: BC Managed Care – PPO | Admitting: Lab

## 2011-11-10 VITALS — BP 123/88 | HR 68 | Temp 97.8°F | Ht 65.5 in | Wt 142.9 lb

## 2011-11-10 DIAGNOSIS — Z5111 Encounter for antineoplastic chemotherapy: Secondary | ICD-10-CM

## 2011-11-10 DIAGNOSIS — C50419 Malignant neoplasm of upper-outer quadrant of unspecified female breast: Secondary | ICD-10-CM

## 2011-11-10 DIAGNOSIS — D709 Neutropenia, unspecified: Secondary | ICD-10-CM

## 2011-11-10 LAB — CBC WITH DIFFERENTIAL/PLATELET
BASO%: 0 % (ref 0.0–2.0)
EOS%: 0.4 % (ref 0.0–7.0)
LYMPH%: 46.1 % (ref 14.0–49.7)
MCH: 35.4 pg — ABNORMAL HIGH (ref 25.1–34.0)
MCHC: 35.5 g/dL (ref 31.5–36.0)
MONO#: 0.3 10*3/uL (ref 0.1–0.9)
NEUT%: 41 % (ref 38.4–76.8)
RBC: 3.05 10*6/uL — ABNORMAL LOW (ref 3.70–5.45)
WBC: 2.7 10*3/uL — ABNORMAL LOW (ref 3.9–10.3)
lymph#: 1.3 10*3/uL (ref 0.9–3.3)
nRBC: 1 % — ABNORMAL HIGH (ref 0–0)

## 2011-11-10 LAB — COMPREHENSIVE METABOLIC PANEL
ALT: 27 U/L (ref 0–35)
AST: 18 U/L (ref 0–37)
Alkaline Phosphatase: 40 U/L (ref 39–117)
BUN: 13 mg/dL (ref 6–23)
Calcium: 8.6 mg/dL (ref 8.4–10.5)
Chloride: 103 mEq/L (ref 96–112)
Creatinine, Ser: 0.55 mg/dL (ref 0.50–1.10)
Potassium: 3.7 mEq/L (ref 3.5–5.3)

## 2011-11-10 MED ORDER — FAMOTIDINE IN NACL 20-0.9 MG/50ML-% IV SOLN
20.0000 mg | Freq: Once | INTRAVENOUS | Status: AC
  Administered 2011-11-10: 20 mg via INTRAVENOUS

## 2011-11-10 MED ORDER — SODIUM CHLORIDE 0.9 % IJ SOLN
10.0000 mL | INTRAMUSCULAR | Status: DC | PRN
  Administered 2011-11-10: 10 mL
  Filled 2011-11-10: qty 10

## 2011-11-10 MED ORDER — ONDANSETRON 16 MG/50ML IVPB (CHCC)
16.0000 mg | Freq: Once | INTRAVENOUS | Status: AC
  Administered 2011-11-10: 16 mg via INTRAVENOUS

## 2011-11-10 MED ORDER — SODIUM CHLORIDE 0.9 % IV SOLN
Freq: Once | INTRAVENOUS | Status: AC
  Administered 2011-11-10: 11:00:00 via INTRAVENOUS

## 2011-11-10 MED ORDER — PACLITAXEL CHEMO INJECTION 300 MG/50ML
80.0000 mg/m2 | Freq: Once | INTRAVENOUS | Status: AC
  Administered 2011-11-10: 132 mg via INTRAVENOUS
  Filled 2011-11-10: qty 22

## 2011-11-10 MED ORDER — DIPHENHYDRAMINE HCL 50 MG/ML IJ SOLN
25.0000 mg | Freq: Once | INTRAMUSCULAR | Status: AC
  Administered 2011-11-10: 25 mg via INTRAVENOUS

## 2011-11-10 MED ORDER — SODIUM CHLORIDE 0.9 % IV SOLN
300.0000 mg | Freq: Once | INTRAVENOUS | Status: AC
  Administered 2011-11-10: 300 mg via INTRAVENOUS
  Filled 2011-11-10: qty 30

## 2011-11-10 MED ORDER — DEXAMETHASONE SODIUM PHOSPHATE 4 MG/ML IJ SOLN
20.0000 mg | Freq: Once | INTRAMUSCULAR | Status: AC
  Administered 2011-11-10: 20 mg via INTRAVENOUS

## 2011-11-10 MED ORDER — HEPARIN SOD (PORK) LOCK FLUSH 100 UNIT/ML IV SOLN
500.0000 [IU] | Freq: Once | INTRAVENOUS | Status: AC | PRN
  Administered 2011-11-10: 500 [IU]
  Filled 2011-11-10: qty 5

## 2011-11-10 MED ORDER — DEXAMETHASONE 4 MG PO TABS: ORAL_TABLET | ORAL | Status: DC

## 2011-11-10 NOTE — Progress Notes (Signed)
Dr. Welton Flakes called.  Verbal order received and read back to treat patient despite ANC 1.1

## 2011-11-10 NOTE — Telephone Encounter (Signed)
gve the pt her July 2013 appt calendar °

## 2011-11-10 NOTE — Progress Notes (Signed)
OFFICE PROGRESS NOTE  CC  Stacey Blamer, MD [facility] 1 [address] [place] [address] Dr. Chipper Herb Dr. Cyndia Bent  DIAGNOSIS: 41 year old with stage IIA (T2NxMx) invasive ductal carcinoma, triple negative,  With ki-67 80% measuring 3.5 x 1.0 cm diagnosed March 2013  PRIOR THERAPY:  1. Seen at [facility] with self palpated breast mass, needle core biopsy positive for IDC, triple negative, stage IIA  2. Genetic testing revealed BRCA I mutation   3. S/P port placement with complication of pneumothorax  4. S/P Neoadjuvant chemotherapy with Adriamycin/cytoxan given dose dense between 07/13/11 - 08/24/11   5. Neoadjuvant carbo/taxol begin 09/06/12  X 12 weeks  CURRENT THERAPY:  Carbo/taxol cycle 10  INTERVAL HISTORY: Stacey Cross 41 y.o. female returns for follow up visit. Overall she is doing well she is continuing to run on a daily basis but is doing 1 mile a day. She however is beginning to feel neuropathic pain in her feet. She also is a little bit of balance most likely due to the neuropathy. She otherwise denies any nausea vomiting fevers chills night sweats headaches shortness of breath chest pains or palpitations. She has no arthralgias or myalgias. Her anc is low and we will give her neupogen for 4 days.Girtha Rm of the 10 point review of systems is negative. MEDICAL HISTORY: Past Medical History  Diagnosis Date  . Breast cancer   . BRCA1 positive 08/17/2011  . Fatigue 09/28/2011  . 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  MEDICATIONS:  Current Outpatient Prescriptions  Medication Sig Dispense Refill  . dexamethasone (DECADRON) 4 MG tablet Take 2 tablets by mouth two times a day starting the day after chemotherapy for 3 days.  20 tablet  1  . fluconazole (DIFLUCAN) 200 MG tablet       . gabapentin (NEURONTIN) 300 MG capsule Take 1 capsule (300 mg total) by mouth at bedtime.  30  capsule  4  . HYDROcodone-acetaminophen (NORCO) 5-325 MG per tablet       . lidocaine-prilocaine (EMLA) cream Apply topically as needed.  30 g  3  . loratadine (CLARITIN) 10 MG tablet Take 10 mg by mouth daily.      Marland Kitchen LORazepam (ATIVAN) 0.5 MG tablet Take 1 tablet (0.5 mg total) by mouth every 6 (six) hours as needed (Nausea or vomiting).  30 tablet  0  . Melatonin 1 MG CAPS Take 1 each by mouth.      . NON FORMULARY Smooth Move -Tea      . nystatin (MYCOSTATIN) 100000 UNIT/ML suspension       . ondansetron (ZOFRAN) 8 MG tablet Take 8 mg by mouth every 8 (eight) hours as needed.      . ondansetron (ZOFRAN) 8 MG tablet Take 1 tab two times a day starting the day after chemo for 3 days. Then take 1 tab two times a day as needed for nausea or vomiting.  30 tablet  1  . prochlorperazine (COMPAZINE) 10 MG tablet Take 1 tablet (10 mg total) by mouth every 6 (six) hours as needed (Nausea or vomiting).  30 tablet  1  . prochlorperazine (COMPAZINE) 25 MG suppository Place 1 suppository (25 mg total) rectally every 12 (twelve) hours as needed for nausea.  12 suppository  3  . sucralfate (CARAFATE) 1 G tablet Take 1 tablet (1 g total) by mouth 4 (four) times daily.  90 tablet  0  SURGICAL HISTORY:  Past Surgical History  Procedure Date  . Cesarean section 2003  . Dilation and curettage of uterus 2002  . Portacath placement 06/23/2011    Procedure: INSERTION PORT-A-CATH;  Surgeon: [name], MD;  Location: [facility];  Service: General;  Laterality: N/A;  carm     REVIEW OF SYSTEMS:  Pertinent items are noted in HPI.   PHYSICAL EXAMINATION: General appearance: alert, cooperative, appears stated age, fatigued, flushed and slowed mentation Neck: no adenopathy, no carotid bruit, no JVD, supple, symmetrical, trachea midline and thyroid not enlarged, symmetric, no tenderness/mass/nodules Lymph nodes: Cervical, supraclavicular, and axillary nodes normal. Resp: clear to auscultation  bilaterally and normal percussion bilaterally Back: symmetric, no curvature. ROM normal. No CVA tenderness. Cardio: regular rate and rhythm, S1, S2 normal, no murmur, click, rub or gallop GI: soft, non-tender; bowel sounds normal; no masses,  no organomegaly Extremities: extremities normal, atraumatic, no cyanosis or edema Neurologic: Alert and oriented X 3, normal strength and tone. Normal symmetric reflexes. Normal coordination and gait  ECOG PERFORMANCE STATUS: 1 - Symptomatic but completely ambulatory  Blood pressure 123/88, pulse 68, temperature 97.8 F (36.6 C), temperature source Oral, height 5' 5.5" (1.664 m), weight 142 lb 14.4 oz (64.819 kg), last menstrual period 08/12/2011.  LABORATORY DATA: Lab Results  Component Value Date   WBC 2.7* 11/10/2011   HGB 10.8* 11/10/2011   HCT 30.4* 11/10/2011   MCV 99.7 11/10/2011   PLT 164 11/10/2011      Chemistry      Component Value Date/Time   NA 135 11/03/2011 0835   K 3.8 11/03/2011 0835   CL 101 11/03/2011 0835   CO2 25 11/03/2011 0835   BUN 8 11/03/2011 0835   CREATININE 0.56 11/03/2011 0835      Component Value Date/Time   CALCIUM 8.7 11/03/2011 0835   ALKPHOS 50 11/03/2011 0835   AST 16 11/03/2011 0835   ALT 29 11/03/2011 0835   BILITOT 0.6 11/03/2011 0835       RADIOGRAPHIC STUDIES:  ASSESSMENT: 41 year old female with  #1 stage II A. 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   #2. BRCA1 Mutation Carrier  #3 Patient completed 4 cycles of Neoadjuvant Adriamycin Cytoxan on 08/24/2011.  #4 patient is now on neoadjuvant Taxol and carboplatinum. She is going to be receiving cycle # 10 of her treatment.   #5 Neutropenia  PLAN:   #1 Patient will proceed with cycle #10/12 of carbo Taxol.  #2. She will neupogen for 4 days to keep her on schedule   #3 we will refer her back to Dr. Jamey Ripa as she is gong to be finishing up her chemotherapy on 11/24/2011. Because patient is BRCA1 positive she is planning on  having bilateral mastectomies and arrangements for reconstruction need to be made as well. Thus the sooner we can get her back to Dr. Jamey Ripa the quickly we can make these arrangements for her surgery.  #4 patient will be seen back in one week's time for cycle #11/12.  All questions were answered. The patient knows to call the clinic with any problems, questions or concerns. We can certainly see the patient much sooner if necessary.  I spent 25 minutes counseling the patient face to face. The total time spent in the appointment was 30 minutes.    Drue Second, MD Medical/Oncology [facility] [phone] (beeper) [phone] (Office)  11/10/2011, 10:03 AM

## 2011-11-10 NOTE — Patient Instructions (Addendum)
Proceed with chemotherapy today.  Neupogen on 7/27, 7/29, 7/30, 7/31  I will see you back in 1 week

## 2011-11-10 NOTE — Patient Instructions (Addendum)
[facility] Discharge Instructions for Patients Receiving Chemotherapy  Today you received the following chemotherapy agents Taxol, carboplatin To help prevent nausea and vomiting after your treatment, we encourage you to take your nausea medication zofran, compazine or lorazepam Begin taking anytime upon discharge and take it as often as prescribed for the next 48 hours and as needed.   If you develop nausea and vomiting that is not controlled by your nausea medication, call the clinic. If it is after clinic hours your family physician or the after hours number for the clinic or go to the Emergency Department.   BELOW ARE SYMPTOMS THAT SHOULD BE REPORTED IMMEDIATELY:  *FEVER GREATER THAN 100.5 F  *CHILLS WITH OR WITHOUT FEVER  NAUSEA AND VOMITING THAT IS NOT CONTROLLED WITH YOUR NAUSEA MEDICATION  *UNUSUAL SHORTNESS OF BREATH  *UNUSUAL BRUISING OR BLEEDING  TENDERNESS IN MOUTH AND THROAT WITH OR WITHOUT PRESENCE OF ULCERS  *URINARY PROBLEMS  *BOWEL PROBLEMS  UNUSUAL RASH Items with * indicate a potential emergency and should be followed up as soon as possible.  Please call to let a nurse know about any problems that you may have experienced. Feel free to call the clinic you have any questions or concerns. The clinic phone number is [phone].   I have been informed and understand all the instructions given to me. I know to contact the clinic, my physician, or go to the Emergency Department if any problems should occur. I do not have any questions at this time, but understand that I may call the clinic during office hours   should I have any questions or need assistance in obtaining follow up care.    __________________________________________  _____________  __________ Signature of Patient or Authorized Representative            Date                   Time    __________________________________________ Nurse's Signature   Neutropenia Neutropenia is a  condition that occurs when the level of a certain type of white blood cell (neutrophil) in your body becomes lower than normal. Neutrophils are made in the bone marrow and fight infections. These cells protect against bacteria and viruses. The fewer neutrophils you have, and the longer your body remains without them, the greater your risk of getting a severe infection becomes. CAUSES  The cause of neutropenia may be hard to determine. However, it is usually due to 3 main problems:   Decreased production of neutrophils. This may be due to:   Certain medicines such as chemotherapy.   Genetic problems.   Cancer.   Radiation treatments.   Vitamin deficiency.   Some pesticides.   Increased destruction of neutrophils. This may be due to:   Overwhelming infections.   Hemolytic anemia. This is when the body destroys its own blood cells.   Chemotherapy.   Neutrophils moving to areas of the body where they cannot fight infections. This may be due to:   Dialysis procedures.   Conditions where the spleen becomes enlarged. Neutrophils are held in the spleen and are not available to the rest of the body.   Overwhelming infections. The neutrophils are held in the area of the infection and are not available to the rest of the body.  SYMPTOMS  There are no specific symptoms of neutropenia. The lack of neutrophils can result in an infection, and an infection can cause various problems. DIAGNOSIS  Diagnosis is made by a  blood test. A complete blood count is performed. The normal level of neutrophils in human blood differs with age and race. Infants have lower counts than older children and adults. African Americans have lower counts than Caucasians or Asians. The average adult level is 1500 cells/mm3 of blood. Neutrophil counts are interpreted as follows:  Greater than 1000 cells/mm3 gives normal protection against infection.   500 to 1000 cells/mm3 gives an increased risk for infection.   200  to 500 cells/mm3 is a greater risk for severe infection.   Lower than 200 cells/mm3 is a marked risk of infection. This may require hospitalization and treatment with antibiotic medicines.  TREATMENT  Treatment depends on the underlying cause, severity, and presence of infections or symptoms. It also depends on your health. Your caregiver will discuss the treatment plan with you. Mild cases are often easily treated and have a good outcome. Preventative measures may also be started to limit your risk of infections. Treatment can include:  Taking antibiotics.   Stopping medicines that are known to cause neutropenia.   Correcting nutritional deficiencies by eating green vegetables to supply folic acid and taking vitamin B supplements.   Stopping exposure to pesticides if your neutropenia is related to pesticide exposure.   Taking a blood growth factor called sargramostim, pegfilgrastim, or filgrastim if you are undergoing chemotherapy for cancer. This stimulates white blood cell production.   Removal of the spleen if you have Felty's syndrome and have repeated infections.  HOME CARE INSTRUCTIONS   Follow your caregiver's instructions about when you need to have blood work done.   Wash your hands often. Make sure others who come in contact with you also wash their hands.   Wash raw fruits and vegetables before eating them. They can carry bacteria and fungi.   Avoid people with colds or spreadable (contagious) diseases (chickenpox, herpes zoster, influenza).   Avoid large crowds.   Avoid construction areas. The dust can release fungus into the air.   Be cautious around children in daycare or school environments.   Take care of your respiratory system by coughing and deep breathing.   Bathe daily.   Protect your skin from cuts and burns.   Do not work in the garden or with flowers and plants.   Care for the mouth before and after meals by brushing with a soft toothbrush. If you  have mucositis, do not use mouthwash. Mouthwash contains alcohol and can dry out the mouth even more.   Clean the area between the genitals and the anus (perineal area) after urination and bowel movements. Women need to wipe from front to back.   Use a water soluble lubricant during sexual intercourse and practice good hygiene after. Do not have intercourse if you are severely neutropenic. Check with your caregiver for guidelines.   Exercise daily as tolerated.   Avoid people who were vaccinated with a live vaccine in the past 30 days. You should not receive live vaccines (polio, typhoid).   Do not provide direct care for pets. Avoid animal droppings. Do not clean litter boxes and bird cages.   Do not share food utensils.   Do not use tampons, enemas, or rectal suppositories unless directed by your caregiver.   Use an electric razor to remove hair.   Wash your hands after handling magazines, letters, and newspapers.  SEEK IMMEDIATE MEDICAL CARE IF:   You have a fever.   You have chills or start to shake.   You feel nauseous or  vomit.   You develop mouth sores.   You develop aches and pains.   You have redness and swelling around open wounds.   Your skin is warm to the touch.   You have pus coming from your wounds.   You develop swollen lymph nodes.   You feel weak or fatigued.   You develop red streaks on the skin.  MAKE SURE YOU:  Understand these instructions.   Will watch your condition.   Will get help right away if you are not doing well or get worse.  Document Released: 09/23/2001 Document Revised: 03/23/2011 Document Reviewed: 10/21/2010 [facility] Patient Information 2012 [place], [place].

## 2011-11-10 NOTE — Progress Notes (Signed)
Discharged at 1347 with her Stacey Cross who spent the day with her today.

## 2011-11-11 ENCOUNTER — Ambulatory Visit (HOSPITAL_BASED_OUTPATIENT_CLINIC_OR_DEPARTMENT_OTHER): Payer: BC Managed Care – PPO

## 2011-11-11 VITALS — BP 125/76 | HR 61 | Temp 97.3°F

## 2011-11-11 DIAGNOSIS — D709 Neutropenia, unspecified: Secondary | ICD-10-CM

## 2011-11-11 DIAGNOSIS — C50419 Malignant neoplasm of upper-outer quadrant of unspecified female breast: Secondary | ICD-10-CM

## 2011-11-11 MED ORDER — FILGRASTIM 480 MCG/0.8ML IJ SOLN
480.0000 ug | Freq: Once | INTRAMUSCULAR | Status: AC
  Administered 2011-11-11: 480 ug via SUBCUTANEOUS

## 2011-11-13 ENCOUNTER — Ambulatory Visit (HOSPITAL_BASED_OUTPATIENT_CLINIC_OR_DEPARTMENT_OTHER): Payer: BC Managed Care – PPO

## 2011-11-13 VITALS — BP 113/73 | HR 80 | Temp 98.5°F

## 2011-11-13 DIAGNOSIS — D709 Neutropenia, unspecified: Secondary | ICD-10-CM

## 2011-11-13 MED ORDER — FILGRASTIM 480 MCG/0.8ML IJ SOLN
480.0000 ug | Freq: Once | INTRAMUSCULAR | Status: AC
  Administered 2011-11-13: 480 ug via SUBCUTANEOUS
  Filled 2011-11-13: qty 0.8

## 2011-11-14 ENCOUNTER — Ambulatory Visit (INDEPENDENT_AMBULATORY_CARE_PROVIDER_SITE_OTHER): Payer: BC Managed Care – PPO | Admitting: Surgery

## 2011-11-14 ENCOUNTER — Ambulatory Visit (HOSPITAL_BASED_OUTPATIENT_CLINIC_OR_DEPARTMENT_OTHER): Payer: BC Managed Care – PPO

## 2011-11-14 ENCOUNTER — Encounter (INDEPENDENT_AMBULATORY_CARE_PROVIDER_SITE_OTHER): Payer: Self-pay | Admitting: Surgery

## 2011-11-14 VITALS — BP 108/73 | HR 88 | Temp 98.1°F

## 2011-11-14 VITALS — BP 130/78 | HR 92 | Temp 97.9°F | Resp 16 | Ht 65.0 in | Wt 141.0 lb

## 2011-11-14 DIAGNOSIS — Z1509 Genetic susceptibility to other malignant neoplasm: Secondary | ICD-10-CM

## 2011-11-14 DIAGNOSIS — Z1501 Genetic susceptibility to malignant neoplasm of breast: Secondary | ICD-10-CM

## 2011-11-14 DIAGNOSIS — C50419 Malignant neoplasm of upper-outer quadrant of unspecified female breast: Secondary | ICD-10-CM

## 2011-11-14 DIAGNOSIS — D709 Neutropenia, unspecified: Secondary | ICD-10-CM

## 2011-11-14 MED ORDER — FILGRASTIM 480 MCG/0.8ML IJ SOLN
480.0000 ug | Freq: Once | INTRAMUSCULAR | Status: AC
  Administered 2011-11-14: 480 ug via SUBCUTANEOUS
  Filled 2011-11-14: qty 0.8

## 2011-11-14 NOTE — Progress Notes (Signed)
Chief complaint: Left breast cancer, BRCA1 positive, finishing neoadjuvant chemotherapy  History of present illness: This patient presented several months ago with a 3.5 cm left breast cancer. She is undergoing neoadjuvant chemotherapy which she will complete in a few weeks. She was diagnosed as BRCA1 positive so she is planning a bilateral mastectomy with immediate tissue expander reconstructions. For surgery tentatively a 12/21/2011.  Overall the patient feels like she is doing okay although her chemotherapy has been quite difficult. Has not been able to work. Her running is only down to 1 mile per day.  Past history, family history, social history are all electronic medical record and not redictated this note  Exam: VITAL SIGNS: BP 130/78  Pulse 92  Temp 97.9 F (36.6 C) (Temporal)  Resp 16  Ht 5\' 5"  (1.651 m)  Wt 141 lb (63.957 kg)  BMI 23.46 kg/m2 GENERAL:  The patient is alert, oriented, and generally healthy-appearing, NAD. Mood and affect are normal.  HEENT:  The head is normocephalic, the eyes nonicteric, the pupils were round regular and equal. EOMs are normal. Pharynx normal. Dentition good.  NECK:  The neck is supple and there are no masses or thyromegaly.  LUNGS: Normal respirations and clear to auscultation.  HEART: Regular rhythm, with no murmurs rubs or gallops. Pulses are intact carotid dorsalis pedis and posterior tibial. No significant varicosities are noted.  BREASTS:  The breasts are symmetric. There is no residual mass in the left breast in her cancer appears completely resolved by physical examination.  LYMPHATICS: There is no axillary or supraclavicular adenopathy noted  ABDOMEN: Soft, flat, and nontender. No masses or organomegaly is noted. No hernias are noted. Bowel sounds are normal.  EXTREMITIES:  Good range of motion, no edema.  DATA REVIEWED: I looked over the notes electronic medical record from the oncologist. Also reviewed again her  pathology reports.  Impression clinical stage II left breast cancer completing neoadjuvant chemotherapy; BRCA1 positive  Plan: Right prophylactic total mastectomy with immediate reconstruction; left total mastectomy with sentinel lymph node evaluation and left the dissection if her nodes are positive at the time of surgery. I reviewed the plans for the procedure again with the patient in the interval questions. If she has any questions prior to surgery we will see her again for review them over the telephone. She is up with her plastic surgeon preoperatively as well and he can also reviewed the plans and answer questions.

## 2011-11-14 NOTE — Patient Instructions (Signed)
We will proceed to surgery on September 5th as scheduled. Call me or come back to see me if you have any questions.

## 2011-11-15 ENCOUNTER — Ambulatory Visit (HOSPITAL_BASED_OUTPATIENT_CLINIC_OR_DEPARTMENT_OTHER): Payer: BC Managed Care – PPO

## 2011-11-15 VITALS — BP 112/77 | HR 81 | Temp 99.0°F

## 2011-11-15 DIAGNOSIS — D709 Neutropenia, unspecified: Secondary | ICD-10-CM

## 2011-11-15 MED ORDER — FILGRASTIM 480 MCG/0.8ML IJ SOLN
480.0000 ug | Freq: Once | INTRAMUSCULAR | Status: AC
  Administered 2011-11-15: 480 ug via SUBCUTANEOUS
  Filled 2011-11-15: qty 0.8

## 2011-11-17 ENCOUNTER — Ambulatory Visit (HOSPITAL_BASED_OUTPATIENT_CLINIC_OR_DEPARTMENT_OTHER): Payer: BC Managed Care – PPO

## 2011-11-17 ENCOUNTER — Telehealth: Payer: Self-pay | Admitting: *Deleted

## 2011-11-17 ENCOUNTER — Ambulatory Visit (HOSPITAL_BASED_OUTPATIENT_CLINIC_OR_DEPARTMENT_OTHER): Payer: BC Managed Care – PPO | Admitting: Oncology

## 2011-11-17 ENCOUNTER — Other Ambulatory Visit (HOSPITAL_BASED_OUTPATIENT_CLINIC_OR_DEPARTMENT_OTHER): Payer: BC Managed Care – PPO | Admitting: Lab

## 2011-11-17 ENCOUNTER — Encounter: Payer: Self-pay | Admitting: Oncology

## 2011-11-17 VITALS — BP 114/72 | HR 76 | Temp 97.7°F | Resp 17

## 2011-11-17 VITALS — BP 127/85 | HR 80 | Temp 99.1°F | Resp 20 | Ht 65.0 in | Wt 144.4 lb

## 2011-11-17 DIAGNOSIS — Z5111 Encounter for antineoplastic chemotherapy: Secondary | ICD-10-CM

## 2011-11-17 DIAGNOSIS — Z1501 Genetic susceptibility to malignant neoplasm of breast: Secondary | ICD-10-CM

## 2011-11-17 DIAGNOSIS — C50419 Malignant neoplasm of upper-outer quadrant of unspecified female breast: Secondary | ICD-10-CM

## 2011-11-17 LAB — COMPREHENSIVE METABOLIC PANEL
Albumin: 3.9 g/dL (ref 3.5–5.2)
Alkaline Phosphatase: 95 U/L (ref 39–117)
BUN: 11 mg/dL (ref 6–23)
Calcium: 8.4 mg/dL (ref 8.4–10.5)
Glucose, Bld: 77 mg/dL (ref 70–99)
Potassium: 3.6 mEq/L (ref 3.5–5.3)

## 2011-11-17 LAB — CBC WITH DIFFERENTIAL/PLATELET
Basophils Absolute: 0 10*3/uL (ref 0.0–0.1)
Eosinophils Absolute: 0 10*3/uL (ref 0.0–0.5)
HGB: 10.7 g/dL — ABNORMAL LOW (ref 11.6–15.9)
MCV: 99.3 fL (ref 79.5–101.0)
MONO%: 8.6 % (ref 0.0–14.0)
NEUT#: 5.6 10*3/uL (ref 1.5–6.5)
Platelets: 127 10*3/uL — ABNORMAL LOW (ref 145–400)
RDW: 16.3 % — ABNORMAL HIGH (ref 11.2–14.5)

## 2011-11-17 MED ORDER — PACLITAXEL CHEMO INJECTION 300 MG/50ML
80.0000 mg/m2 | Freq: Once | INTRAVENOUS | Status: AC
  Administered 2011-11-17: 132 mg via INTRAVENOUS
  Filled 2011-11-17: qty 22

## 2011-11-17 MED ORDER — SODIUM CHLORIDE 0.9 % IV SOLN
Freq: Once | INTRAVENOUS | Status: AC
  Administered 2011-11-17: 12:00:00 via INTRAVENOUS

## 2011-11-17 MED ORDER — SODIUM CHLORIDE 0.9 % IJ SOLN
10.0000 mL | INTRAMUSCULAR | Status: DC | PRN
  Administered 2011-11-17: 10 mL
  Filled 2011-11-17: qty 10

## 2011-11-17 MED ORDER — HEPARIN SOD (PORK) LOCK FLUSH 100 UNIT/ML IV SOLN
500.0000 [IU] | Freq: Once | INTRAVENOUS | Status: AC | PRN
  Administered 2011-11-17: 500 [IU]
  Filled 2011-11-17: qty 5

## 2011-11-17 MED ORDER — SODIUM CHLORIDE 0.9 % IV SOLN
300.0000 mg | Freq: Once | INTRAVENOUS | Status: AC
  Administered 2011-11-17: 300 mg via INTRAVENOUS
  Filled 2011-11-17: qty 30

## 2011-11-17 MED ORDER — DEXAMETHASONE SODIUM PHOSPHATE 4 MG/ML IJ SOLN
20.0000 mg | Freq: Once | INTRAMUSCULAR | Status: AC
  Administered 2011-11-17: 20 mg via INTRAVENOUS

## 2011-11-17 MED ORDER — FAMOTIDINE IN NACL 20-0.9 MG/50ML-% IV SOLN
20.0000 mg | Freq: Once | INTRAVENOUS | Status: AC
  Administered 2011-11-17: 20 mg via INTRAVENOUS

## 2011-11-17 MED ORDER — DIPHENHYDRAMINE HCL 50 MG/ML IJ SOLN
25.0000 mg | Freq: Once | INTRAMUSCULAR | Status: AC
  Administered 2011-11-17: 25 mg via INTRAVENOUS

## 2011-11-17 MED ORDER — ONDANSETRON 16 MG/50ML IVPB (CHCC)
16.0000 mg | Freq: Once | INTRAVENOUS | Status: AC
  Administered 2011-11-17: 16 mg via INTRAVENOUS

## 2011-11-17 NOTE — Progress Notes (Signed)
OFFICE PROGRESS NOTE  CC  Stacey Blamer, MD [facility] 1 [address] [place] [address] Dr. Chipper Herb Dr. Cyndia Bent  DIAGNOSIS: 41 year old with stage IIA (T2NxMx) invasive ductal carcinoma, triple negative,  With ki-67 80% measuring 3.5 x 1.0 cm diagnosed March 2013  PRIOR THERAPY:  1. Seen at [facility] with self palpated breast mass, needle core biopsy positive for IDC, triple negative, stage IIA  2. Genetic testing revealed BRCA I mutation   3. S/P port placement with complication of pneumothorax  4. S/P Neoadjuvant chemotherapy with Adriamycin/cytoxan given dose dense between 07/13/11 - 08/24/11   5. Neoadjuvant carbo/taxol begin 09/06/12  X 12 weeks  CURRENT THERAPY:  Carbo/taxol cycle 11  INTERVAL HISTORY: Stacey Cross 41 y.o. female returns for follow up visit. Overall she is doing well she is continuing to run on a daily basis but is doing 1 mile a day. She however is beginning to feel neuropathic pain in her feet. She also is a little bit of balance most likely due to the neuropathy. She otherwise denies any nausea vomiting fevers chills night sweats headaches shortness of breath chest pains or palpitations. She has no arthralgias or myalgias. Remainder of the 10 point review of systems is negative. MEDICAL HISTORY: Past Medical History  Diagnosis Date  . Breast cancer   . BRCA1 positive 08/17/2011  . Fatigue 09/28/2011  . 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  MEDICATIONS:  Current Outpatient Prescriptions  Medication Sig Dispense Refill  . dexamethasone (DECADRON) 4 MG tablet Take 2 tablets by mouth two times a day starting the day after chemotherapy for 3 days.  20 tablet  1  . fluconazole (DIFLUCAN) 200 MG tablet       . HYDROcodone-acetaminophen (NORCO) 5-325 MG per tablet       . lidocaine-prilocaine (EMLA) cream Apply topically as needed.  30 g  3  . loratadine  (CLARITIN) 10 MG tablet Take 10 mg by mouth daily.      Marland Kitchen LORazepam (ATIVAN) 0.5 MG tablet Take 1 tablet (0.5 mg total) by mouth every 6 (six) hours as needed (Nausea or vomiting).  30 tablet  0  . Melatonin 1 MG CAPS Take 1 each by mouth.      . NON FORMULARY Smooth Move -Tea      . nystatin (MYCOSTATIN) 100000 UNIT/ML suspension       . ondansetron (ZOFRAN) 8 MG tablet Take 8 mg by mouth every 8 (eight) hours as needed.      . ondansetron (ZOFRAN) 8 MG tablet Take 1 tab two times a day starting the day after chemo for 3 days. Then take 1 tab two times a day as needed for nausea or vomiting.  30 tablet  1  . prochlorperazine (COMPAZINE) 10 MG tablet Take 1 tablet (10 mg total) by mouth every 6 (six) hours as needed (Nausea or vomiting).  30 tablet  1  . prochlorperazine (COMPAZINE) 25 MG suppository Place 1 suppository (25 mg total) rectally every 12 (twelve) hours as needed for nausea.  12 suppository  3  . sucralfate (CARAFATE) 1 G tablet Take 1 tablet (1 g total) by mouth 4 (four) times daily.  90 tablet  0  . gabapentin (NEURONTIN) 300 MG capsule Take 1 capsule (300 mg total) by mouth at bedtime.  30 capsule  4    SURGICAL HISTORY:  Past Surgical History  Procedure Date  .  Cesarean section 2003  . Dilation and curettage of uterus 2002  . Portacath placement 06/23/2011    Procedure: INSERTION PORT-A-CATH;  Surgeon: [name], MD;  Location: [facility];  Service: General;  Laterality: N/A;  carm     REVIEW OF SYSTEMS:  Pertinent items are noted in HPI.   PHYSICAL EXAMINATION: General appearance: alert, cooperative, appears stated age, fatigued, flushed and slowed mentation Neck: no adenopathy, no carotid bruit, no JVD, supple, symmetrical, trachea midline and thyroid not enlarged, symmetric, no tenderness/mass/nodules Lymph nodes: Cervical, supraclavicular, and axillary nodes normal. Resp: clear to auscultation bilaterally and normal percussion bilaterally Back:  symmetric, no curvature. ROM normal. No CVA tenderness. Cardio: regular rate and rhythm, S1, S2 normal, no murmur, click, rub or gallop GI: soft, non-tender; bowel sounds normal; no masses,  no organomegaly Extremities: extremities normal, atraumatic, no cyanosis or edema Neurologic: Alert and oriented X 3, normal strength and tone. Normal symmetric reflexes. Normal coordination and gait  ECOG PERFORMANCE STATUS: 1 - Symptomatic but completely ambulatory  Blood pressure 127/85, pulse 80, temperature 99.1 F (37.3 C), temperature source Oral, resp. rate 20, height 5\' 5"  (1.651 m), weight 144 lb 6.4 oz (65.499 kg).  LABORATORY DATA: Lab Results  Component Value Date   WBC 7.6 11/17/2011   HGB 10.7* 11/17/2011   HCT 30.0* 11/17/2011   MCV 99.3 11/17/2011   PLT 127* 11/17/2011      Chemistry      Component Value Date/Time   NA 137 11/10/2011 0904   K 3.7 11/10/2011 0904   CL 103 11/10/2011 0904   CO2 27 11/10/2011 0904   BUN 13 11/10/2011 0904   CREATININE 0.55 11/10/2011 0904      Component Value Date/Time   CALCIUM 8.6 11/10/2011 0904   ALKPHOS 40 11/10/2011 0904   AST 18 11/10/2011 0904   ALT 27 11/10/2011 0904   BILITOT 0.4 11/10/2011 0904       RADIOGRAPHIC STUDIES:  ASSESSMENT: 41 year old female with  #1 stage II A. 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   #2. BRCA1 Mutation Carrier  #3 Patient completed 4 cycles of Neoadjuvant Adriamycin Cytoxan on 08/24/2011.  #4 patient is now on neoadjuvant Taxol and carboplatinum. She is going to be receiving cycle # 11 of her treatment.   #5 Neutropenia - resolved  PLAN:   #1 Patient will proceed with cycle #11/12 of carbo Taxol.  #2 we will refer her back to Dr. Jamey Ripa as she is gong to be finishing up her chemotherapy on 11/24/2011. Because patient is BRCA1 positive she is planning on having bilateral mastectomies and arrangements for reconstruction need to be made as well. Thus the sooner we can get her back to  Dr. Jamey Ripa the quickly we can make these arrangements for her surgery.  #3 patient will be seen back in one week's time for cycle #11/12.  All questions were answered. The patient knows to call the clinic with any problems, questions or concerns. We can certainly see the patient much sooner if necessary.  I spent 25 minutes counseling the patient face to face. The total time spent in the appointment was 30 minutes.    Drue Second, MD Medical/Oncology [facility] [phone] (beeper) [phone] (Office)  11/17/2011, 10:38 AM

## 2011-11-17 NOTE — Patient Instructions (Addendum)
Proceed with chemotherapy today 

## 2011-11-17 NOTE — Patient Instructions (Signed)
[facility] Discharge Instructions for Patients Receiving Chemotherapy  Today you received the following chemotherapy agents Taxol and Carbo.  To help prevent nausea and vomiting after your treatment, we encourage you to take your nausea medication.  Begin taking it as often as prescribed for by Dr. Welton Flakes.    If you develop nausea and vomiting that is not controlled by your nausea medication, call the clinic. If it is after clinic hours your family physician or the after hours number for the clinic or go to the Emergency Department.   BELOW ARE SYMPTOMS THAT SHOULD BE REPORTED IMMEDIATELY:  *FEVER GREATER THAN 100.5 F  *CHILLS WITH OR WITHOUT FEVER  NAUSEA AND VOMITING THAT IS NOT CONTROLLED WITH YOUR NAUSEA MEDICATION  *UNUSUAL SHORTNESS OF BREATH  *UNUSUAL BRUISING OR BLEEDING  TENDERNESS IN MOUTH AND THROAT WITH OR WITHOUT PRESENCE OF ULCERS  *URINARY PROBLEMS  *BOWEL PROBLEMS  UNUSUAL RASH Items with * indicate a potential emergency and should be followed up as soon as possible.  One of the nurses will contact you 24 hours after your treatment. Please let the nurse know about any problems that you may have experienced. Feel free to call the clinic you have any questions or concerns. The clinic phone number is [phone].   I have been informed and understand all the instructions given to me. I know to contact the clinic, my physician, or go to the Emergency Department if any problems should occur. I do not have any questions at this time, but understand that I may call the clinic during office hours   should I have any questions or need assistance in obtaining follow up care.    __________________________________________  _____________  __________ Signature of Patient or Authorized Representative            Date                   Time    __________________________________________ Nurse's Signature

## 2011-11-17 NOTE — Telephone Encounter (Signed)
Nothing to be schedule as of August 05-2011

## 2011-11-24 ENCOUNTER — Ambulatory Visit: Payer: BC Managed Care – PPO | Admitting: Family

## 2011-11-24 ENCOUNTER — Other Ambulatory Visit: Payer: BC Managed Care – PPO | Admitting: Lab

## 2011-11-24 ENCOUNTER — Telehealth: Payer: Self-pay | Admitting: Oncology

## 2011-11-24 ENCOUNTER — Ambulatory Visit (HOSPITAL_BASED_OUTPATIENT_CLINIC_OR_DEPARTMENT_OTHER): Payer: BC Managed Care – PPO

## 2011-11-24 ENCOUNTER — Encounter: Payer: Self-pay | Admitting: Oncology

## 2011-11-24 ENCOUNTER — Ambulatory Visit (HOSPITAL_BASED_OUTPATIENT_CLINIC_OR_DEPARTMENT_OTHER): Payer: BC Managed Care – PPO | Admitting: Oncology

## 2011-11-24 ENCOUNTER — Ambulatory Visit (HOSPITAL_BASED_OUTPATIENT_CLINIC_OR_DEPARTMENT_OTHER): Payer: BC Managed Care – PPO | Admitting: Lab

## 2011-11-24 VITALS — BP 118/85 | HR 85 | Temp 99.7°F | Resp 20 | Ht 65.0 in | Wt 143.9 lb

## 2011-11-24 DIAGNOSIS — C50419 Malignant neoplasm of upper-outer quadrant of unspecified female breast: Secondary | ICD-10-CM

## 2011-11-24 DIAGNOSIS — Z5111 Encounter for antineoplastic chemotherapy: Secondary | ICD-10-CM

## 2011-11-24 LAB — CBC WITH DIFFERENTIAL/PLATELET
BASO%: 0.3 % (ref 0.0–2.0)
EOS%: 0.3 % (ref 0.0–7.0)
LYMPH%: 28.5 % (ref 14.0–49.7)
MCH: 34.9 pg — ABNORMAL HIGH (ref 25.1–34.0)
MCHC: 35.7 g/dL (ref 31.5–36.0)
MONO#: 0.2 10*3/uL (ref 0.1–0.9)
MONO%: 3.8 % (ref 0.0–14.0)
NEUT%: 67.1 % (ref 38.4–76.8)
Platelets: 127 10*3/uL — ABNORMAL LOW (ref 145–400)
RBC: 2.98 10*6/uL — ABNORMAL LOW (ref 3.70–5.45)
WBC: 3.9 10*3/uL (ref 3.9–10.3)

## 2011-11-24 LAB — COMPREHENSIVE METABOLIC PANEL
ALT: 24 U/L (ref 0–35)
AST: 14 U/L (ref 0–37)
Alkaline Phosphatase: 54 U/L (ref 39–117)
CO2: 26 mEq/L (ref 19–32)
Creatinine, Ser: 0.54 mg/dL (ref 0.50–1.10)
Sodium: 141 mEq/L (ref 135–145)
Total Bilirubin: 0.3 mg/dL (ref 0.3–1.2)
Total Protein: 5.6 g/dL — ABNORMAL LOW (ref 6.0–8.3)

## 2011-11-24 MED ORDER — ONDANSETRON 16 MG/50ML IVPB (CHCC)
16.0000 mg | Freq: Once | INTRAVENOUS | Status: AC
  Administered 2011-11-24: 16 mg via INTRAVENOUS

## 2011-11-24 MED ORDER — DIPHENHYDRAMINE HCL 50 MG/ML IJ SOLN
25.0000 mg | Freq: Once | INTRAMUSCULAR | Status: AC
  Administered 2011-11-24: 25 mg via INTRAVENOUS

## 2011-11-24 MED ORDER — DEXAMETHASONE SODIUM PHOSPHATE 4 MG/ML IJ SOLN
20.0000 mg | Freq: Once | INTRAMUSCULAR | Status: AC
  Administered 2011-11-24: 20 mg via INTRAVENOUS

## 2011-11-24 MED ORDER — FAMOTIDINE IN NACL 20-0.9 MG/50ML-% IV SOLN
20.0000 mg | Freq: Once | INTRAVENOUS | Status: AC
  Administered 2011-11-24: 20 mg via INTRAVENOUS

## 2011-11-24 MED ORDER — SODIUM CHLORIDE 0.9 % IV SOLN
Freq: Once | INTRAVENOUS | Status: AC
  Administered 2011-11-24: 11:00:00 via INTRAVENOUS

## 2011-11-24 MED ORDER — SODIUM CHLORIDE 0.9 % IV SOLN
300.0000 mg | Freq: Once | INTRAVENOUS | Status: AC
  Administered 2011-11-24: 300 mg via INTRAVENOUS
  Filled 2011-11-24: qty 30

## 2011-11-24 MED ORDER — HEPARIN SOD (PORK) LOCK FLUSH 100 UNIT/ML IV SOLN
500.0000 [IU] | Freq: Once | INTRAVENOUS | Status: AC | PRN
  Administered 2011-11-24: 500 [IU]
  Filled 2011-11-24: qty 5

## 2011-11-24 MED ORDER — SODIUM CHLORIDE 0.9 % IJ SOLN
10.0000 mL | INTRAMUSCULAR | Status: DC | PRN
  Administered 2011-11-24: 10 mL
  Filled 2011-11-24: qty 10

## 2011-11-24 MED ORDER — PACLITAXEL CHEMO INJECTION 300 MG/50ML
80.0000 mg/m2 | Freq: Once | INTRAVENOUS | Status: AC
  Administered 2011-11-24: 132 mg via INTRAVENOUS
  Filled 2011-11-24: qty 22

## 2011-11-24 NOTE — Patient Instructions (Addendum)
1. Preoceed with last chemotherapy today  2. IVF today, Saturday and Monday  3. Return in 2 weeks for follow up   CONGRATULATIONS!!!!!!

## 2011-11-24 NOTE — Telephone Encounter (Signed)
S/w the pt and she is aware of her iv fluid appt on sat@10 :00am

## 2011-11-24 NOTE — Patient Instructions (Addendum)
 Cancer Center Discharge Instructions for Patients Receiving Chemotherapy  Today you received the following chemotherapy agents taxol/carboplatin  To help prevent nausea and vomiting after your treatment, we encourage you to take your nausea medication  Begin taking it tonight and take it as often as prescribed .   If you develop nausea and vomiting that is not controlled by your nausea medication, call the clinic. If it is after clinic hours your family physician or the after hours number for the clinic or go to the Emergency Department.   BELOW ARE SYMPTOMS THAT SHOULD BE REPORTED IMMEDIATELY:  *FEVER GREATER THAN 100.5 F  *CHILLS WITH OR WITHOUT FEVER  NAUSEA AND VOMITING THAT IS NOT CONTROLLED WITH YOUR NAUSEA MEDICATION  *UNUSUAL SHORTNESS OF BREATH  *UNUSUAL BRUISING OR BLEEDING  TENDERNESS IN MOUTH AND THROAT WITH OR WITHOUT PRESENCE OF ULCERS  *URINARY PROBLEMS  *BOWEL PROBLEMS  UNUSUAL RASH Items with * indicate a potential emergency and should be followed up as soon as possible. . Feel free to call the clinic you have any questions or concerns. The clinic phone number is [phone].   I have been informed and understand all the instructions given to me. I know to contact the clinic, my physician, or go to the Emergency Department if any problems should occur. I do not have any questions at this time, but understand that I may call the clinic during office hours   should I have any questions or need assistance in obtaining follow up care.    __________________________________________  _____________  __________ Signature of Patient or Authorized Representative            Date                   Time    __________________________________________ Nurse's Signature

## 2011-11-24 NOTE — Progress Notes (Signed)
OFFICE PROGRESS NOTE  CC  Stacey Blamer, MD [facility] [address] [place] [address] Dr. Chipper Herb Dr. Cyndia Bent  DIAGNOSIS: 41 year old with stage IIA (T2NxMx) invasive ductal carcinoma, triple negative,  With ki-67 80% measuring 3.5 x 1.0 cm diagnosed March 2013  PRIOR THERAPY:  1. Seen at [facility] with self palpated breast mass, needle core biopsy positive for IDC, triple negative, stage IIA  2. Genetic testing revealed BRCA I mutation   3. S/P port placement with complication of pneumothorax  4. S/P Neoadjuvant chemotherapy with Adriamycin/cytoxan given dose dense between 07/13/11 - 08/24/11   5. Neoadjuvant carbo/taxol begin 09/06/12  X 12 weeks  CURRENT THERAPY:  Carbo/taxol cycle 12  INTERVAL HISTORY: Stacey Cross 41 y.o. female returns for follow up visit. Overall she is doing well she is continuing to run on a daily basis but is doing 1 mile a day. She however is beginning to feel neuropathic pain in her feet. She also is a little bit of balance most likely due to the neuropathy. She otherwise denies any nausea vomiting fevers chills night sweats headaches shortness of breath chest pains or palpitations. She has no arthralgias or myalgias. Remainder of the 10 point review of systems is negative. MEDICAL HISTORY: Past Medical History  Diagnosis Date  . Breast cancer   . BRCA1 positive 08/17/2011  . Fatigue 09/28/2011  . 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  MEDICATIONS:  Current Outpatient Prescriptions  Medication Sig Dispense Refill  . dexamethasone (DECADRON) 4 MG tablet Take 2 tablets by mouth two times a day starting the day after chemotherapy for 3 days.  20 tablet  1  . fluconazole (DIFLUCAN) 200 MG tablet       . gabapentin (NEURONTIN) 300 MG capsule Take 1 capsule (300 mg total) by mouth at bedtime.  30 capsule  4  . HYDROcodone-acetaminophen (NORCO) 5-325 MG  per tablet       . lidocaine-prilocaine (EMLA) cream Apply topically as needed.  30 g  3  . loratadine (CLARITIN) 10 MG tablet Take 10 mg by mouth daily.      Marland Kitchen LORazepam (ATIVAN) 0.5 MG tablet Take 1 tablet (0.5 mg total) by mouth every 6 (six) hours as needed (Nausea or vomiting).  30 tablet  0  . Melatonin 1 MG CAPS Take 1 each by mouth.      . NON FORMULARY Smooth Move -Tea      . nystatin (MYCOSTATIN) 100000 UNIT/ML suspension       . ondansetron (ZOFRAN) 8 MG tablet Take 8 mg by mouth every 8 (eight) hours as needed.      . ondansetron (ZOFRAN) 8 MG tablet Take 1 tab two times a day starting the day after chemo for 3 days. Then take 1 tab two times a day as needed for nausea or vomiting.  30 tablet  1  . prochlorperazine (COMPAZINE) 10 MG tablet Take 1 tablet (10 mg total) by mouth every 6 (six) hours as needed (Nausea or vomiting).  30 tablet  1  . prochlorperazine (COMPAZINE) 25 MG suppository Place 1 suppository (25 mg total) rectally every 12 (twelve) hours as needed for nausea.  12 suppository  3  . sucralfate (CARAFATE) 1 G tablet Take 1 tablet (1 g total) by mouth 4 (four) times daily.  90 tablet  0    SURGICAL HISTORY:  Past Surgical History  Procedure Date  .  Cesarean section 2003  . Dilation and curettage of uterus 2002  . Portacath placement 06/23/2011    Procedure: INSERTION PORT-A-CATH;  Surgeon: [name], MD;  Location: [facility];  Service: General;  Laterality: N/A;  carm     REVIEW OF SYSTEMS:  Pertinent items are noted in HPI.   PHYSICAL EXAMINATION: General appearance: alert, cooperative, appears stated age, fatigued, flushed and slowed mentation Neck: no adenopathy, no carotid bruit, no JVD, supple, symmetrical, trachea midline and thyroid not enlarged, symmetric, no tenderness/mass/nodules Lymph nodes: Cervical, supraclavicular, and axillary nodes normal. Resp: clear to auscultation bilaterally and normal percussion bilaterally Back:  symmetric, no curvature. ROM normal. No CVA tenderness. Cardio: regular rate and rhythm, S1, S2 normal, no murmur, click, rub or gallop GI: soft, non-tender; bowel sounds normal; no masses,  no organomegaly Extremities: extremities normal, atraumatic, no cyanosis or edema Neurologic: Alert and oriented X 3, normal strength and tone. Normal symmetric reflexes. Normal coordination and gait  ECOG PERFORMANCE STATUS: 1 - Symptomatic but completely ambulatory  Blood pressure 118/85, pulse 85, temperature 99.7 F (37.6 C), temperature source Oral, resp. rate 20, height 5\' 5"  (1.651 m), weight 143 lb 14.4 oz (65.273 kg).  LABORATORY DATA: Lab Results  Component Value Date   WBC 3.9 11/24/2011   HGB 10.4* 11/24/2011   HCT 29.1* 11/24/2011   MCV 97.7 11/24/2011   PLT 127* 11/24/2011      Chemistry      Component Value Date/Time   NA 139 11/17/2011 1004   K 3.6 11/17/2011 1004   CL 102 11/17/2011 1004   CO2 28 11/17/2011 1004   BUN 11 11/17/2011 1004   CREATININE 0.64 11/17/2011 1004      Component Value Date/Time   CALCIUM 8.4 11/17/2011 1004   ALKPHOS 95 11/17/2011 1004   AST 21 11/17/2011 1004   ALT 32 11/17/2011 1004   BILITOT 0.4 11/17/2011 1004       RADIOGRAPHIC STUDIES:  ASSESSMENT: 41 year old female with  #1 stage II A. 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   #2. BRCA1 Mutation Carrier  #3 Patient completed 4 cycles of Neoadjuvant Adriamycin Cytoxan on 08/24/2011.  #4 patient is now on neoadjuvant Taxol and carboplatinum. She is going to be receiving cycle # 12 of her treatment.   #5 Neutropenia - resolved  PLAN:   #1 Patient will proceed with cycle #12/12 of carbo Taxol.  #2 we will refer her back to Dr. Jamey Ripa as she is gong to be finishing up her chemotherapy on 11/24/2011. Because patient is BRCA1 positive she is planning on having bilateral mastectomies and arrangements for reconstruction need to be made as well. Thus the sooner we can get her back to Dr.  Jamey Ripa the quickly we can make these arrangements for her surgery.  #3 patient will be seen back in 2 week's time   #4. IVF on 8/10, and 8/12  All questions were answered. The patient knows to call the clinic with any problems, questions or concerns. We can certainly see the patient much sooner if necessary.  I spent 25 minutes counseling the patient face to face. The total time spent in the appointment was 30 minutes.    Drue Second, MD Medical/Oncology [facility] [phone] (beeper) [phone] (Office)  11/24/2011, 9:54 AM

## 2011-11-25 ENCOUNTER — Ambulatory Visit (HOSPITAL_BASED_OUTPATIENT_CLINIC_OR_DEPARTMENT_OTHER): Payer: BC Managed Care – PPO

## 2011-11-25 VITALS — BP 144/82 | HR 90 | Temp 97.7°F

## 2011-11-25 DIAGNOSIS — C50419 Malignant neoplasm of upper-outer quadrant of unspecified female breast: Secondary | ICD-10-CM

## 2011-11-25 DIAGNOSIS — R4182 Altered mental status, unspecified: Secondary | ICD-10-CM

## 2011-11-25 DIAGNOSIS — R5383 Other fatigue: Secondary | ICD-10-CM

## 2011-11-25 DIAGNOSIS — R232 Flushing: Secondary | ICD-10-CM

## 2011-11-25 MED ORDER — SODIUM CHLORIDE 0.9 % IV SOLN
INTRAVENOUS | Status: DC
  Administered 2011-11-25: 10:00:00 via INTRAVENOUS

## 2011-11-27 ENCOUNTER — Ambulatory Visit (HOSPITAL_BASED_OUTPATIENT_CLINIC_OR_DEPARTMENT_OTHER): Payer: BC Managed Care – PPO

## 2011-11-27 DIAGNOSIS — R4182 Altered mental status, unspecified: Secondary | ICD-10-CM

## 2011-11-27 DIAGNOSIS — R232 Flushing: Secondary | ICD-10-CM

## 2011-11-27 DIAGNOSIS — R5381 Other malaise: Secondary | ICD-10-CM

## 2011-11-27 DIAGNOSIS — D649 Anemia, unspecified: Secondary | ICD-10-CM

## 2011-11-27 MED ORDER — HEPARIN SOD (PORK) LOCK FLUSH 100 UNIT/ML IV SOLN
500.0000 [IU] | Freq: Every day | INTRAVENOUS | Status: AC | PRN
  Administered 2011-11-27: 500 [IU]
  Filled 2011-11-27: qty 5

## 2011-11-27 MED ORDER — SODIUM CHLORIDE 0.9 % IJ SOLN
10.0000 mL | INTRAMUSCULAR | Status: AC | PRN
  Administered 2011-11-27: 10 mL
  Filled 2011-11-27: qty 10

## 2011-11-27 MED ORDER — SODIUM CHLORIDE 0.9 % IV SOLN: 250.0000 mL | Freq: Once | INTRAVENOUS | Status: DC

## 2011-11-27 NOTE — Patient Instructions (Addendum)
[facility] Discharge Instructions for Patients Receiving IV Fluids  Today you received 1 liter of NS IV fluids.  To help prevent nausea and vomiting after your treatment, we encourage you to take your nausea medication as ordered.     If you develop nausea and vomiting that is not controlled by your nausea medication, call the clinic. If it is after clinic hours call the after hours number for the clinic or go to the Emergency Department.   BELOW ARE SYMPTOMS THAT SHOULD BE REPORTED IMMEDIATELY:  *FEVER GREATER THAN 100.5 F  *CHILLS WITH OR WITHOUT FEVER  NAUSEA AND VOMITING THAT IS NOT CONTROLLED WITH YOUR NAUSEA MEDICATION  *UNUSUAL SHORTNESS OF BREATH  *UNUSUAL BRUISING OR BLEEDING  TENDERNESS IN MOUTH AND THROAT WITH OR WITHOUT PRESENCE OF ULCERS  *URINARY PROBLEMS  *BOWEL PROBLEMS  UNUSUAL RASH Items with * indicate a potential emergency and should be followed up as soon as possible.  One of the nurses will contact you 24 hours after your treatment. Please let the nurse know about any problems that you may have experienced. Feel free to call the clinic you have any questions or concerns. The clinic phone number is [phone].   I have been informed and understand all the instructions given to me. I know to contact the clinic, my physician, or go to the Emergency Department if any problems should occur. I do not have any questions at this time, but understand that I may call the clinic during office hours   should I have any questions or need assistance in obtaining follow up care.    __________________________________________  _____________  __________ Signature of Patient or Authorized Representative            Date                   Time    __________________________________________ Nurse's Signature

## 2011-11-30 ENCOUNTER — Telehealth: Payer: Self-pay | Admitting: Medical Oncology

## 2011-11-30 NOTE — Telephone Encounter (Signed)
LMOVM regarding schedule change, informed patient of new appointment date/time of 8/27 lab @245 , MD @ 3.  Instructed patient to call confirming date and time.

## 2011-12-05 ENCOUNTER — Encounter (HOSPITAL_COMMUNITY): Payer: Self-pay | Admitting: Pharmacy Technician

## 2011-12-07 ENCOUNTER — Telehealth: Payer: Self-pay | Admitting: *Deleted

## 2011-12-07 NOTE — Telephone Encounter (Signed)
patient called and confirmed over the phone

## 2011-12-12 ENCOUNTER — Telehealth: Payer: Self-pay | Admitting: *Deleted

## 2011-12-12 ENCOUNTER — Ambulatory Visit (HOSPITAL_BASED_OUTPATIENT_CLINIC_OR_DEPARTMENT_OTHER): Payer: BC Managed Care – PPO | Admitting: Oncology

## 2011-12-12 ENCOUNTER — Other Ambulatory Visit (HOSPITAL_BASED_OUTPATIENT_CLINIC_OR_DEPARTMENT_OTHER): Payer: BC Managed Care – PPO | Admitting: Lab

## 2011-12-12 ENCOUNTER — Encounter: Payer: Self-pay | Admitting: Oncology

## 2011-12-12 VITALS — BP 102/70 | HR 86 | Temp 98.8°F | Resp 20 | Ht 65.0 in | Wt 147.3 lb

## 2011-12-12 DIAGNOSIS — C50419 Malignant neoplasm of upper-outer quadrant of unspecified female breast: Secondary | ICD-10-CM

## 2011-12-12 DIAGNOSIS — Z1501 Genetic susceptibility to malignant neoplasm of breast: Secondary | ICD-10-CM

## 2011-12-12 LAB — CBC WITH DIFFERENTIAL/PLATELET
BASO%: 0.2 % (ref 0.0–2.0)
EOS%: 0.4 % (ref 0.0–7.0)
HCT: 32 % — ABNORMAL LOW (ref 34.8–46.6)
LYMPH%: 29.5 % (ref 14.0–49.7)
MCH: 38.1 pg — ABNORMAL HIGH (ref 25.1–34.0)
MCHC: 35.6 g/dL (ref 31.5–36.0)
NEUT%: 57.1 % (ref 38.4–76.8)
Platelets: 177 10*3/uL (ref 145–400)

## 2011-12-12 LAB — BASIC METABOLIC PANEL (CC13)
Calcium: 8.9 mg/dL (ref 8.4–10.4)
Creatinine: 0.7 mg/dL (ref 0.6–1.1)

## 2011-12-12 MED ORDER — CIPROFLOXACIN HCL 500 MG PO TABS: 500.0000 mg | ORAL_TABLET | Freq: Two times a day (BID) | ORAL | Status: AC

## 2011-12-12 NOTE — Telephone Encounter (Signed)
Gave patient appointment for 01-03-2012 starting at 8:30am

## 2011-12-12 NOTE — Progress Notes (Signed)
OFFICE PROGRESS NOTE  CC  [name], MD [facility] [address] [place] [address] Dr. Chipper Herb Dr. Cyndia Bent  DIAGNOSIS: 41 year old with stage IIA (T2NxMx) invasive ductal carcinoma, triple negative,  With ki-67 80% measuring 3.5 x 1.0 cm diagnosed March 2013  PRIOR THERAPY:  1. Seen at [facility] with self palpated breast mass, needle core biopsy positive for IDC, triple negative, stage IIA  2. Genetic testing revealed BRCA I mutation   3. S/P port placement with complication of pneumothorax  4. S/P Neoadjuvant chemotherapy with Adriamycin/cytoxan given dose dense between 07/13/11 - 08/24/11   5. Neoadjuvant carbo/taxol begin 09/06/12  X 12 weeks  CURRENT THERAPY:  Patient will now proceed with her scheduled surgery  INTERVAL HISTORY: Stacey Cross 41 y.o. female returns for follow up visit. Overall rate is doing great she has recovered from her chemotherapy. 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 She denies any nausea vomiting fevers chills night sweats no headaches no shortness of breath no chest pains or palpitation she still does have some fatigue and weakness but this should slowly improved. She has begun running again.  MEDICAL HISTORY: Past Medical History  Diagnosis Date  . Breast cancer   . BRCA1 positive 08/17/2011  . Fatigue 09/28/2011  . 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  MEDICATIONS:  Current Outpatient Prescriptions  Medication Sig Dispense Refill  . b complex vitamins tablet Take 1 tablet by mouth daily.      Marland Kitchen CALCIUM PO Take 3 tablets by mouth 2 (two) times daily.      . Multiple Vitamin (MULTIVITAMIN WITH MINERALS) TABS Take 1 tablet by mouth daily.      . Probiotic Product (PROBIOTIC PO) Take 1 capsule by mouth daily.      . ciprofloxacin (CIPRO) 500 MG tablet Take 1 tablet (500 mg total) by mouth 2 (two) times  daily.  10 tablet  0    SURGICAL HISTORY:  Past Surgical History  Procedure Date  . Cesarean section 2003  . Dilation and curettage of uterus 2002  . Portacath placement 06/23/2011    Procedure: INSERTION PORT-A-CATH;  Surgeon: [name], MD;  Location: [facility];  Service: General;  Laterality: N/A;  carm     REVIEW OF SYSTEMS:  Pertinent items are noted in HPI.   PHYSICAL EXAMINATION: General appearance: alert, cooperative, appears stated age, fatigued, flushed and slowed mentation Neck: no adenopathy, no carotid bruit, no JVD, supple, symmetrical, trachea midline and thyroid not enlarged, symmetric, no tenderness/mass/nodules Lymph nodes: Cervical, supraclavicular, and axillary nodes normal. Resp: clear to auscultation bilaterally and normal percussion bilaterally Back: symmetric, no curvature. ROM normal. No CVA tenderness. Cardio: regular rate and rhythm, S1, S2 normal, no murmur, click, rub or gallop GI: soft, non-tender; bowel sounds normal; no masses,  no organomegaly Extremities: extremities normal, atraumatic, no cyanosis or edema Neurologic: Alert and oriented X 3, normal strength and tone. Normal symmetric reflexes. Normal coordination and gait  ECOG PERFORMANCE STATUS: 1 - Symptomatic but completely ambulatory  Blood pressure 102/70, pulse 86, temperature 98.8 F (37.1 C), temperature source Oral, resp. rate 20, height 5\' 5"  (1.651 m), weight 147 lb 4.8 oz (66.815 kg).  LABORATORY DATA: Lab Results  Component Value Date   WBC 2.8* 12/12/2011   HGB 11.4* 12/12/2011   HCT 32.0* 12/12/2011   MCV 107.1* 12/12/2011   PLT  177 12/12/2011      Chemistry      Component Value Date/Time   NA 141 11/24/2011 0850   K 3.6 11/24/2011 0850   CL 105 11/24/2011 0850   CO2 26 11/24/2011 0850   BUN 14 11/24/2011 0850   CREATININE 0.54 11/24/2011 0850      Component Value Date/Time   CALCIUM 9.0 11/24/2011 0850   ALKPHOS 54 11/24/2011 0850   AST 14 11/24/2011 0850   ALT  24 11/24/2011 0850   BILITOT 0.3 11/24/2011 0850       RADIOGRAPHIC STUDIES:  ASSESSMENT: 41 year old female with  #1 stage II A. 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   #2. BRCA1 Mutation Carrier  #3 Patient completed 4 cycles of Neoadjuvant Adriamycin Cytoxan on 08/24/2011. #4 status post 12 cycles of Taxol and carboplatinum. #5 Neutropenia - resolved  PLAN:   #1Has essentially recovered from her chemotherapy. She will now proceed with her bilateral mastectomies and reconstruction.  #2 I will plan on seeing her back in about one to 2 months time.  All questions were answered. The patient knows to call the clinic with any problems, questions or concerns. We can certainly see the patient much sooner if necessary.  I spent >15 minutes counseling the patient face to face. The total time spent in the appointment was 30 minutes.    Drue Second, MD Medical/Oncology [facility] [phone] (beeper) [phone] (Office)  12/12/2011, 3:43 PM

## 2011-12-12 NOTE — Patient Instructions (Addendum)
cipro 500 mg BID x 5 days  I will see you back in 3 week

## 2011-12-13 ENCOUNTER — Encounter: Payer: Self-pay | Admitting: Oncology

## 2011-12-13 NOTE — Progress Notes (Signed)
Faxed clinical information to [employer] @ [phone], patient will be out of work until 02/05/12; claim # [id number].

## 2011-12-14 NOTE — Pre-Procedure Instructions (Signed)
20 Stacey Cross  12/14/2011   Your procedure is scheduled on:  Thursday, September 5th .  Report to [facility] at 7:30  Call this number if you have problems the morning of surgery: [phone]   Remember:   Do not eat food or drink any liquid: After Midnight     Take these medicines the morning of surgery with A SIP OF WATER: Ciprofloxacin (Cipro).  Stop taking any Aspirin, NSAIDs or Herbal medications.   Do not wear jewelry, make-up or nail polish.  Do not wear lotions, powders, or perfumes. You may wear deodorant.  Do not shave 48 hours prior to surgery. Men may shave face and neck.  Do not bring valuables to the hospital.  Contacts, dentures or bridgework may not be worn into surgery.  Leave suitcase in the car. After surgery it may be brought to your room.  For patients admitted to the hospital, checkout time is 11:00 AM the day of discharge.   Patients discharged the day of surgery will not be allowed to drive home.  Name and phone number of your driver: NA  Special Instructions: CHG Shower Use Special Wash: 1/2 bottle night before surgery and 1/2 bottle morning of surgery.   Please read over the following fact sheets that you were given: Pain Booklet, Coughing and Deep Breathing and Surgical Site Infection Prevention

## 2011-12-15 ENCOUNTER — Encounter (HOSPITAL_COMMUNITY)
Admission: RE | Admit: 2011-12-15 | Discharge: 2011-12-15 | Disposition: A | Discharge status: Home / Self Care | Payer: BC Managed Care – PPO | Source: Ambulatory Visit | Attending: Surgery | Admitting: Surgery

## 2011-12-15 ENCOUNTER — Encounter (HOSPITAL_COMMUNITY): Payer: Self-pay | Admitting: *Deleted

## 2011-12-15 ENCOUNTER — Encounter (HOSPITAL_COMMUNITY): Payer: Self-pay

## 2011-12-15 ENCOUNTER — Ambulatory Visit: Payer: BC Managed Care – PPO | Admitting: Oncology

## 2011-12-15 ENCOUNTER — Other Ambulatory Visit: Payer: BC Managed Care – PPO | Admitting: Lab

## 2011-12-15 HISTORY — DX: Postprocedural pneumothorax: J95.811

## 2011-12-15 LAB — HCG, SERUM, QUALITATIVE: Preg, Serum: NEGATIVE

## 2011-12-15 LAB — SURGICAL PCR SCREEN: MRSA, PCR: NEGATIVE

## 2011-12-19 ENCOUNTER — Telehealth: Payer: Self-pay | Admitting: *Deleted

## 2011-12-19 DIAGNOSIS — C50419 Malignant neoplasm of upper-outer quadrant of unspecified female breast: Secondary | ICD-10-CM

## 2011-12-19 NOTE — Telephone Encounter (Signed)
Pt called lmovm, bilateral mastectomy on Thursday and I have a cough , it's dry nonproductive and the last 2 nights was an issue. Just want to make sure it's okay before I go into surgery on Thursday." Review with MD, pt to get Chest XRAY prior to surgery. Informed pt of MD  Instructions. Pt advised she is unable to come in today and if possible would  Be able to come in on Wednesday after 1200p[m. onc tx sent

## 2011-12-20 ENCOUNTER — Ambulatory Visit (HOSPITAL_COMMUNITY)
Admission: RE | Admit: 2011-12-20 | Discharge: 2011-12-20 | Disposition: A | Discharge status: Home / Self Care | Payer: BC Managed Care – PPO | Source: Ambulatory Visit | Attending: Oncology | Admitting: Oncology

## 2011-12-20 DIAGNOSIS — C50419 Malignant neoplasm of upper-outer quadrant of unspecified female breast: Secondary | ICD-10-CM

## 2011-12-20 MED ORDER — CEFAZOLIN SODIUM-DEXTROSE 2-3 GM-% IV SOLR
2.0000 g | INTRAVENOUS | Status: AC
  Administered 2011-12-21: 2 g via INTRAVENOUS
  Administered 2011-12-21: 1 g via INTRAVENOUS
  Filled 2011-12-20: qty 50

## 2011-12-21 ENCOUNTER — Ambulatory Visit (HOSPITAL_COMMUNITY): Payer: BC Managed Care – PPO | Admitting: Anesthesiology

## 2011-12-21 ENCOUNTER — Encounter (HOSPITAL_COMMUNITY): Payer: Self-pay | Admitting: Anesthesiology

## 2011-12-21 ENCOUNTER — Inpatient Hospital Stay (HOSPITAL_COMMUNITY)
Admission: RE | Admit: 2011-12-21 | Discharge: 2011-12-23 | DRG: 257 | Disposition: A | Discharge status: Home / Self Care | Payer: BC Managed Care – PPO | Source: Ambulatory Visit | Attending: Surgery | Admitting: Surgery

## 2011-12-21 ENCOUNTER — Encounter (HOSPITAL_COMMUNITY)
Admission: RE | Disposition: A | Discharge status: Home / Self Care | Payer: Self-pay | Source: Ambulatory Visit | Attending: Surgery

## 2011-12-21 ENCOUNTER — Ambulatory Visit (HOSPITAL_COMMUNITY)
Admission: RE | Admit: 2011-12-21 | Discharge: 2011-12-21 | Disposition: A | Discharge status: Home / Self Care | Payer: BC Managed Care – PPO | Source: Ambulatory Visit | Attending: Surgery | Admitting: Surgery

## 2011-12-21 ENCOUNTER — Telehealth: Payer: Self-pay | Admitting: *Deleted

## 2011-12-21 ENCOUNTER — Encounter (HOSPITAL_COMMUNITY): Payer: Self-pay | Admitting: General Practice

## 2011-12-21 DIAGNOSIS — Z01812 Encounter for preprocedural laboratory examination: Secondary | ICD-10-CM

## 2011-12-21 DIAGNOSIS — C50419 Malignant neoplasm of upper-outer quadrant of unspecified female breast: Principal | ICD-10-CM | POA: Diagnosis present

## 2011-12-21 DIAGNOSIS — Z853 Personal history of malignant neoplasm of breast: Secondary | ICD-10-CM

## 2011-12-21 DIAGNOSIS — C50912 Malignant neoplasm of unspecified site of left female breast: Secondary | ICD-10-CM

## 2011-12-21 DIAGNOSIS — C50919 Malignant neoplasm of unspecified site of unspecified female breast: Secondary | ICD-10-CM

## 2011-12-21 DIAGNOSIS — Z4001 Encounter for prophylactic removal of breast: Secondary | ICD-10-CM

## 2011-12-21 DIAGNOSIS — D708 Other neutropenia: Secondary | ICD-10-CM | POA: Diagnosis present

## 2011-12-21 DIAGNOSIS — Z1501 Genetic susceptibility to malignant neoplasm of breast: Secondary | ICD-10-CM | POA: Diagnosis present

## 2011-12-21 DIAGNOSIS — D6959 Other secondary thrombocytopenia: Secondary | ICD-10-CM | POA: Diagnosis present

## 2011-12-21 DIAGNOSIS — Z79899 Other long term (current) drug therapy: Secondary | ICD-10-CM

## 2011-12-21 DIAGNOSIS — Z01818 Encounter for other preprocedural examination: Secondary | ICD-10-CM

## 2011-12-21 HISTORY — PX: MASTECTOMY, RADICAL: SHX710

## 2011-12-21 HISTORY — DX: Shortness of breath: R06.02

## 2011-12-21 HISTORY — DX: Personal history of other medical treatment: Z92.89

## 2011-12-21 HISTORY — PX: MASTECTOMY COMPLETE / SIMPLE W/ SENTINEL NODE BIOPSY: SUR846

## 2011-12-21 HISTORY — PX: TISSUE EXPANDER PLACEMENT: SHX2530

## 2011-12-21 SURGERY — SIMPLE MASTECTOMY WITH AXILLARY SENTINEL NODE BIOPSY
Anesthesia: General | Site: Breast | Laterality: Right | Wound class: Clean

## 2011-12-21 MED ORDER — DIPHENHYDRAMINE HCL 12.5 MG/5ML PO ELIX
12.5000 mg | ORAL_SOLUTION | Freq: Four times a day (QID) | ORAL | Status: DC | PRN
  Filled 2011-12-21: qty 5

## 2011-12-21 MED ORDER — DEXAMETHASONE SODIUM PHOSPHATE 10 MG/ML IJ SOLN
INTRAMUSCULAR | Status: DC | PRN
  Administered 2011-12-21: 8 mg via INTRAVENOUS

## 2011-12-21 MED ORDER — HYDROMORPHONE HCL PF 1 MG/ML IJ SOLN
0.2500 mg | INTRAMUSCULAR | Status: DC | PRN
  Administered 2011-12-21: 0.5 mg via INTRAVENOUS
  Administered 2011-12-21 (×2): 0.25 mg via INTRAVENOUS
  Administered 2011-12-21: 0.5 mg via INTRAVENOUS

## 2011-12-21 MED ORDER — CHLORHEXIDINE GLUCONATE 4 % EX LIQD: 1.0000 "application " | Freq: Once | CUTANEOUS | Status: DC

## 2011-12-21 MED ORDER — ONDANSETRON HCL 4 MG/2ML IJ SOLN: 4.0000 mg | Freq: Once | INTRAMUSCULAR | Status: DC | PRN

## 2011-12-21 MED ORDER — LACTATED RINGERS IV SOLN: INTRAVENOUS | Status: DC

## 2011-12-21 MED ORDER — DOCUSATE SODIUM 100 MG PO CAPS
100.0000 mg | ORAL_CAPSULE | Freq: Every day | ORAL | Status: DC
  Administered 2011-12-22 – 2011-12-23 (×2): 100 mg via ORAL
  Filled 2011-12-21 (×2): qty 1

## 2011-12-21 MED ORDER — METHOCARBAMOL 500 MG PO TABS
500.0000 mg | ORAL_TABLET | Freq: Four times a day (QID) | ORAL | Status: DC
  Administered 2011-12-21 – 2011-12-23 (×7): 500 mg via ORAL
  Filled 2011-12-21 (×10): qty 1

## 2011-12-21 MED ORDER — DIPHENHYDRAMINE HCL 50 MG/ML IJ SOLN: 12.5000 mg | Freq: Four times a day (QID) | INTRAMUSCULAR | Status: DC | PRN

## 2011-12-21 MED ORDER — PROMETHAZINE HCL 25 MG/ML IJ SOLN: 6.2500 mg | INTRAMUSCULAR | Status: DC | PRN

## 2011-12-21 MED ORDER — ACETAMINOPHEN 10 MG/ML IV SOLN
INTRAVENOUS | Status: AC
  Filled 2011-12-21: qty 100

## 2011-12-21 MED ORDER — ALBUMIN HUMAN 5 % IV SOLN
INTRAVENOUS | Status: DC | PRN
  Administered 2011-12-21: 13:00:00 via INTRAVENOUS

## 2011-12-21 MED ORDER — GLYCOPYRROLATE 0.2 MG/ML IJ SOLN
INTRAMUSCULAR | Status: DC | PRN
  Administered 2011-12-21: .5 mg via INTRAVENOUS

## 2011-12-21 MED ORDER — ONDANSETRON HCL 4 MG/2ML IJ SOLN: 4.0000 mg | Freq: Four times a day (QID) | INTRAMUSCULAR | Status: DC | PRN

## 2011-12-21 MED ORDER — SODIUM CHLORIDE 0.9 % IJ SOLN: 9.0000 mL | INTRAMUSCULAR | Status: DC | PRN

## 2011-12-21 MED ORDER — ACETAMINOPHEN 10 MG/ML IV SOLN
INTRAVENOUS | Status: DC | PRN
  Administered 2011-12-21: 1000 mg via INTRAVENOUS

## 2011-12-21 MED ORDER — CEFAZOLIN SODIUM 1-5 GM-% IV SOLN
INTRAVENOUS | Status: AC
  Filled 2011-12-21: qty 50

## 2011-12-21 MED ORDER — SODIUM CHLORIDE 0.9 % IR SOLN
Status: DC | PRN
  Administered 2011-12-21 (×2)

## 2011-12-21 MED ORDER — HYDROMORPHONE HCL PF 1 MG/ML IJ SOLN
INTRAMUSCULAR | Status: AC
  Filled 2011-12-21: qty 1

## 2011-12-21 MED ORDER — CEFAZOLIN SODIUM 1-5 GM-% IV SOLN
1.0000 g | Freq: Three times a day (TID) | INTRAVENOUS | Status: DC
  Administered 2011-12-21 – 2011-12-23 (×6): 1 g via INTRAVENOUS
  Filled 2011-12-21 (×8): qty 50

## 2011-12-21 MED ORDER — CEFAZOLIN SODIUM-DEXTROSE 2-3 GM-% IV SOLR: 2.0000 g | INTRAVENOUS | Status: DC

## 2011-12-21 MED ORDER — ACETAMINOPHEN 10 MG/ML IV SOLN: 1000.0000 mg | Freq: Once | INTRAVENOUS | Status: DC | PRN

## 2011-12-21 MED ORDER — LIDOCAINE HCL (CARDIAC) 20 MG/ML IV SOLN
INTRAVENOUS | Status: DC | PRN
  Administered 2011-12-21: 50 mg via INTRAVENOUS

## 2011-12-21 MED ORDER — VECURONIUM BROMIDE 10 MG IV SOLR
INTRAVENOUS | Status: DC | PRN
  Administered 2011-12-21: 1 mg via INTRAVENOUS
  Administered 2011-12-21: 3 mg via INTRAVENOUS
  Administered 2011-12-21: 2 mg via INTRAVENOUS

## 2011-12-21 MED ORDER — ROCURONIUM BROMIDE 100 MG/10ML IV SOLN
INTRAVENOUS | Status: DC | PRN
  Administered 2011-12-21: 40 mg via INTRAVENOUS

## 2011-12-21 MED ORDER — MIDAZOLAM HCL 5 MG/5ML IJ SOLN
INTRAMUSCULAR | Status: DC | PRN
  Administered 2011-12-21: 2 mg via INTRAVENOUS

## 2011-12-21 MED ORDER — ENOXAPARIN SODIUM 40 MG/0.4ML ~~LOC~~ SOLN
40.0000 mg | Freq: Once | SUBCUTANEOUS | Status: AC
  Administered 2011-12-21: 40 mg via SUBCUTANEOUS
  Filled 2011-12-21: qty 0.4

## 2011-12-21 MED ORDER — PROPOFOL 10 MG/ML IV EMUL
INTRAVENOUS | Status: DC | PRN
  Administered 2011-12-21: 160 mg via INTRAVENOUS

## 2011-12-21 MED ORDER — NALOXONE HCL 0.4 MG/ML IJ SOLN: 0.4000 mg | INTRAMUSCULAR | Status: DC | PRN

## 2011-12-21 MED ORDER — METHYLENE BLUE 1 % INJ SOLN
INTRAMUSCULAR | Status: AC
  Filled 2011-12-21: qty 10

## 2011-12-21 MED ORDER — SUFENTANIL CITRATE 50 MCG/ML IV SOLN
INTRAVENOUS | Status: DC | PRN
  Administered 2011-12-21: 10 ug via INTRAVENOUS
  Administered 2011-12-21: 5 ug via INTRAVENOUS
  Administered 2011-12-21 (×3): 10 ug via INTRAVENOUS
  Administered 2011-12-21: 5 ug via INTRAVENOUS
  Administered 2011-12-21: 20 ug via INTRAVENOUS
  Administered 2011-12-21: 10 ug via INTRAVENOUS

## 2011-12-21 MED ORDER — 0.9 % SODIUM CHLORIDE (POUR BTL) OPTIME
TOPICAL | Status: DC | PRN
  Administered 2011-12-21 (×3): 1000 mL

## 2011-12-21 MED ORDER — BUPIVACAINE HCL (PF) 0.25 % IJ SOLN
INTRAMUSCULAR | Status: AC
  Filled 2011-12-21: qty 30

## 2011-12-21 MED ORDER — PHENYLEPHRINE HCL 10 MG/ML IJ SOLN
INTRAMUSCULAR | Status: DC | PRN
  Administered 2011-12-21 (×2): 40 ug via INTRAVENOUS

## 2011-12-21 MED ORDER — DEXTROSE-NACL 5-0.45 % IV SOLN
INTRAVENOUS | Status: DC
  Administered 2011-12-21 – 2011-12-22 (×2): 1000 mL via INTRAVENOUS

## 2011-12-21 MED ORDER — LACTATED RINGERS IV SOLN
INTRAVENOUS | Status: DC | PRN
  Administered 2011-12-21 (×4): via INTRAVENOUS

## 2011-12-21 MED ORDER — TECHNETIUM TC 99M SULFUR COLLOID FILTERED
1.0000 | Freq: Once | INTRAVENOUS | Status: AC | PRN
  Administered 2011-12-21: 1 via INTRADERMAL

## 2011-12-21 MED ORDER — ONDANSETRON HCL 4 MG/2ML IJ SOLN
INTRAMUSCULAR | Status: DC | PRN
  Administered 2011-12-21 (×2): 4 mg via INTRAVENOUS

## 2011-12-21 MED ORDER — LIDOCAINE HCL 4 % MT SOLN
OROMUCOSAL | Status: DC | PRN
  Administered 2011-12-21: 4 mL via TOPICAL

## 2011-12-21 MED ORDER — SODIUM CHLORIDE 0.9 % IJ SOLN
INTRAMUSCULAR | Status: DC | PRN
  Administered 2011-12-21: 11:00:00 via INTRAMUSCULAR

## 2011-12-21 MED ORDER — HYDROMORPHONE 0.3 MG/ML IV SOLN
INTRAVENOUS | Status: AC
  Filled 2011-12-21: qty 25

## 2011-12-21 MED ORDER — HYDROMORPHONE 0.3 MG/ML IV SOLN
INTRAVENOUS | Status: DC
  Administered 2011-12-21: 3.19 mg via INTRAVENOUS
  Administered 2011-12-21: 23 mL via INTRAVENOUS
  Administered 2011-12-22: 4.53 mg via INTRAVENOUS
  Administered 2011-12-22: 01:00:00 via INTRAVENOUS
  Administered 2011-12-22: 3.25 mg via INTRAVENOUS
  Filled 2011-12-21 (×2): qty 25

## 2011-12-21 SURGICAL SUPPLY — 83 items
APPLIER CLIP 9.375 MED OPEN (MISCELLANEOUS) ×4
APPLIER CLIP 9.375 SM OPEN (CLIP)
ATCH SMKEVC FLXB CAUT HNDSWH (FILTER) ×3 IMPLANT
BAG DECANTER FOR FLEXI CONT (MISCELLANEOUS) ×8 IMPLANT
BINDER BREAST LRG (GAUZE/BANDAGES/DRESSINGS) IMPLANT
BINDER BREAST XLRG (GAUZE/BANDAGES/DRESSINGS) ×4 IMPLANT
BIOPATCH RED 1 DISK 7.0 (GAUZE/BANDAGES/DRESSINGS) ×16 IMPLANT
BLADE SURG 10 STRL SS (BLADE) ×4 IMPLANT
CANISTER SUCTION 2500CC (MISCELLANEOUS) ×4 IMPLANT
CHLORAPREP W/TINT 26ML (MISCELLANEOUS) ×8 IMPLANT
CLIP APPLIE 9.375 MED OPEN (MISCELLANEOUS) ×3 IMPLANT
CLIP APPLIE 9.375 SM OPEN (CLIP) IMPLANT
CLOTH BEACON ORANGE TIMEOUT ST (SAFETY) ×8 IMPLANT
CONT SPEC 4OZ CLIKSEAL STRL BL (MISCELLANEOUS) ×12 IMPLANT
COVER PROBE W GEL 5X96 (DRAPES) ×8 IMPLANT
COVER SURGICAL LIGHT HANDLE (MISCELLANEOUS) ×8 IMPLANT
DERMABOND ADVANCED (GAUZE/BANDAGES/DRESSINGS) ×2
DERMABOND ADVANCED .7 DNX12 (GAUZE/BANDAGES/DRESSINGS) ×6 IMPLANT
DRAIN CHANNEL 19F RND (DRAIN) ×16 IMPLANT
DRAPE LAPAROSCOPIC ABDOMINAL (DRAPES) IMPLANT
DRAPE ORTHO SPLIT 77X108 STRL (DRAPES) ×2
DRAPE PROXIMA HALF (DRAPES) ×28 IMPLANT
DRAPE SURG 17X23 STRL (DRAPES) ×16 IMPLANT
DRAPE SURG ORHT 6 SPLT 77X108 (DRAPES) ×6 IMPLANT
DRAPE UTILITY 15X26 W/TAPE STR (DRAPE) ×8 IMPLANT
DRAPE WARM FLUID 44X44 (DRAPE) ×4 IMPLANT
DRSG PAD ABDOMINAL 8X10 ST (GAUZE/BANDAGES/DRESSINGS) ×4 IMPLANT
DRSG TEGADERM 4X4.75 (GAUZE/BANDAGES/DRESSINGS) ×4 IMPLANT
ELECT BLADE 4.0 EZ CLEAN MEGAD (MISCELLANEOUS) ×4
ELECT BLADE 6.5 EXT (BLADE) IMPLANT
ELECT CAUTERY BLADE 6.4 (BLADE) ×8 IMPLANT
ELECT REM PT RETURN 9FT ADLT (ELECTROSURGICAL) ×4
ELECTRODE BLDE 4.0 EZ CLN MEGD (MISCELLANEOUS) ×3 IMPLANT
ELECTRODE REM PT RTRN 9FT ADLT (ELECTROSURGICAL) ×3 IMPLANT
EVACUATOR SILICONE 100CC (DRAIN) ×16 IMPLANT
EVACUATOR SMOKE ACCUVAC VALLEY (FILTER) ×1
GAUZE VASELINE 3X9 (GAUZE/BANDAGES/DRESSINGS) IMPLANT
GLOVE BIO SURGEON STRL SZ7.5 (GLOVE) ×12 IMPLANT
GLOVE BIO SURGEON STRL SZ8 (GLOVE) ×4 IMPLANT
GLOVE BIOGEL PI IND STRL 6.5 (GLOVE) ×6 IMPLANT
GLOVE BIOGEL PI IND STRL 7.0 (GLOVE) ×9 IMPLANT
GLOVE BIOGEL PI IND STRL 8 (GLOVE) ×3 IMPLANT
GLOVE BIOGEL PI INDICATOR 6.5 (GLOVE) ×2
GLOVE BIOGEL PI INDICATOR 7.0 (GLOVE) ×3
GLOVE BIOGEL PI INDICATOR 8 (GLOVE) ×1
GLOVE ECLIPSE 6.5 STRL STRAW (GLOVE) ×8 IMPLANT
GLOVE EUDERMIC 7 POWDERFREE (GLOVE) ×8 IMPLANT
GLOVE SURG SS PI 6.0 STRL IVOR (GLOVE) ×8 IMPLANT
GOWN PREVENTION PLUS XLARGE (GOWN DISPOSABLE) ×8 IMPLANT
GOWN STRL NON-REIN LRG LVL3 (GOWN DISPOSABLE) ×24 IMPLANT
KIT BASIN OR (CUSTOM PROCEDURE TRAY) ×8 IMPLANT
KIT ROOM TURNOVER OR (KITS) ×4 IMPLANT
MARKER SKIN DUAL TIP RULER LAB (MISCELLANEOUS) ×8 IMPLANT
NEEDLE 18GX1X1/2 (RX/OR ONLY) (NEEDLE) ×4 IMPLANT
NEEDLE HYPO 25GX1X1/2 BEV (NEEDLE) ×4 IMPLANT
NS IRRIG 1000ML POUR BTL (IV SOLUTION) ×12 IMPLANT
PACK GENERAL/GYN (CUSTOM PROCEDURE TRAY) ×8 IMPLANT
PAD ARMBOARD 7.5X6 YLW CONV (MISCELLANEOUS) ×8 IMPLANT
PEN SKIN MARKING BROAD (MISCELLANEOUS) IMPLANT
PREFILTER EVAC NS 1 1/3-3/8IN (MISCELLANEOUS) ×4 IMPLANT
SET ASEPTIC TRANSFER (MISCELLANEOUS) ×4 IMPLANT
SPECIMEN JAR LARGE (MISCELLANEOUS) IMPLANT
SPECIMEN JAR X LARGE (MISCELLANEOUS) ×8 IMPLANT
SPONGE GAUZE 4X4 12PLY (GAUZE/BANDAGES/DRESSINGS) IMPLANT
SPONGE INTESTINAL PEANUT (DISPOSABLE) IMPLANT
SPONGE LAP 18X18 X RAY DECT (DISPOSABLE) ×12 IMPLANT
SPONGE LAP 4X18 X RAY DECT (DISPOSABLE) ×4 IMPLANT
STAPLER VISISTAT 35W (STAPLE) ×8 IMPLANT
SUT ETHILON 2 0 FS 18 (SUTURE) IMPLANT
SUT ETHILON 3 0 FSL (SUTURE) IMPLANT
SUT MNCRL AB 3-0 PS2 18 (SUTURE) ×20 IMPLANT
SUT MON AB 4-0 PC3 18 (SUTURE) ×4 IMPLANT
SUT PDS AB 3-0 SH 27 (SUTURE) IMPLANT
SUT PROLENE 3 0 PS 2 (SUTURE) ×16 IMPLANT
SUT VIC AB 3-0 SH 18 (SUTURE) ×8 IMPLANT
SYR BULB IRRIGATION 50ML (SYRINGE) ×4 IMPLANT
SYR CONTROL 10ML LL (SYRINGE) ×4 IMPLANT
TISSUE EXPANDER (Prosthesis & Implant Plastic) ×8 IMPLANT
TOWEL OR 17X24 6PK STRL BLUE (TOWEL DISPOSABLE) ×8 IMPLANT
TOWEL OR 17X26 10 PK STRL BLUE (TOWEL DISPOSABLE) ×8 IMPLANT
TOWEL OR NON WOVEN STRL DISP B (DISPOSABLE) ×8 IMPLANT
TRAY FOLEY CATH 14FRSI W/METER (CATHETERS) ×4 IMPLANT
TUBE CONNECTING 12X1/4 (SUCTIONS) ×4 IMPLANT

## 2011-12-21 NOTE — Telephone Encounter (Signed)
Per MD, Called pt, spoke with Husband as pt is currently in surgery for Bilateral Mastectomy, notified Chest Xray ok. Mr Vanoverbeke  Verbalized understanding and thanked me for the call.

## 2011-12-21 NOTE — Anesthesia Procedure Notes (Signed)
Procedure Name: Intubation Date/Time: 12/21/2011 10:20 AM Performed by: Lovie Chol Pre-anesthesia Checklist: Patient identified, Emergency Drugs available, Suction available, Patient being monitored and Timeout performed Patient Re-evaluated:Patient Re-evaluated prior to inductionOxygen Delivery Method: Circle system utilized Preoxygenation: Pre-oxygenation with 100% oxygen Intubation Type: IV induction Ventilation: Mask ventilation without difficulty Laryngoscope Size: Miller and 2 Grade View: Grade I Tube type: Oral Tube size: 7.0 mm Number of attempts: 1 Airway Equipment and Method: Stylet and LTA kit utilized Placement Confirmation: ETT inserted through vocal cords under direct vision,  positive ETCO2 and breath sounds checked- equal and bilateral Secured at: 20 cm Tube secured with: Tape Dental Injury: Teeth and Oropharynx as per pre-operative assessment

## 2011-12-21 NOTE — H&P (Signed)
  Chief complaint: Left breast cancer, BRCA1 positive, finished neoadjuvant chemotherapy  History of present illness: This patient presented several months ago with a 3.5 cm left breast cancer. She is undergoing neoadjuvant chemotherapy which she will complete in a few weeks. She was diagnosed as BRCA1 positive so she is planning a bilateral mastectomy with immediate tissue expander reconstructions. For surgery today.  Overall the patient feels like she is doing okay although her chemotherapy has been quite difficult. Has not been able to work. Her running is only down to 1 mile per day.  Past history, family history, social history are all electronic medical record and not redictated this note  Exam:  VITAL SIGNS: BP 130/78  Pulse 92  Temp 97.9 F (36.6 C) (Temporal)  Resp 16  Ht 5\' 5"  (1.651 m)  Wt 141 lb (63.957 kg)  BMI 23.46 kg/m2  GENERAL:  The patient is alert, oriented, and generally healthy-appearing, NAD. Mood and affect are normal.  HEENT:  The head is normocephalic, the eyes nonicteric, the pupils were round regular and equal. EOMs are normal. Pharynx normal. Dentition good.  NECK:  The neck is supple and there are no masses or thyromegaly.  LUNGS:  Normal respirations and clear to auscultation.  HEART:  Regular rhythm, with no murmurs rubs or gallops. Pulses are intact carotid dorsalis pedis and posterior tibial. No significant varicosities are noted.  BREASTS:  The breasts are symmetric. There is no residual mass in the left breast in her cancer appears completely resolved by physical examination.  LYMPHATICS:  There is no axillary or supraclavicular adenopathy noted  ABDOMEN:  Soft, flat, and nontender. No masses or organomegaly is noted. No hernias are noted. Bowel sounds are normal.  EXTREMITIES:  Good range of motion, no edema.  DATA REVIEWED: I looked over the notes electronic medical record from the oncologist. Also reviewed again her pathology reports.  Impression  clinical stage II left breast cancer completing neoadjuvant chemotherapy; BRCA1 positive  Plan: Right prophylactic total mastectomy with immediate reconstruction; left total mastectomy with sentinel lymph node evaluation and as dissection dissection if her nodes are positive at the time of surgery. I reviewed the plans for the procedure again with the patient and she has no additional questions or concerns. The left breast is marked as the side for the SLN

## 2011-12-21 NOTE — Progress Notes (Signed)
Call to [facility] in [facility] Med, dept., to confirm pt. Is on their schedule.

## 2011-12-21 NOTE — Preoperative (Signed)
Beta Blockers   Reason not to administer Beta Blockers:Not Applicable 

## 2011-12-21 NOTE — Op Note (Signed)
Stacey Cross Jun 27, 1970 [id number] 11/07/2011  Preoperative diagnosis: Left Breast cancer, upper outer quadrant, s/p neoadjuvant chemo; BRCA+  Postoperative diagnosis: Same  Procedure: Left total mastectomy with blue dye injection and SLN; right prophylactic mastectomy  Surgeon: [name], MD, FACS  Assistant: Harvie Junior, MD, FACS  Anesthesia: General   Clinical History and Indications: This patient was dx several months ago with a breast cancer upeer outer quadrant left breast. She tested BRCA+. She completed neoadjuvant chemo and presents now for bilateral mastectomy and left sentinel node removal    Description of Procedure: I saw the patient in the pre-op area and reviewed the plans again and answered questions. I then marked the left breast as the side for the SLN.  The patient was taken to the OR and after satisfactory general endotracheal anesthesia was obtained the time out was performed. I then injected 5cc of dilute methylene blue in the sub-areolar area on the left. A full prep and drape of both breasts was then done.  The right mastectomy was done first. An elliptical incision transversely was made, just wide enough to get all of the nipple areolar complex. The usual skin flaps were raised with cautery, to the clavicle, sternum, inframammary fold and latissimus. The breast was then removed from the chet wall from medial to lateral. I stayed out of the axilla.Once everything was dry I place a moist lap pad and turned my attention to the left.  Again a transverse incision was utilized, angled more towards the upper outer quadrant to get the skin overlying the area of the tumor. The Neoprobe was used to mark the skin of the axilla over a "hot" area indicating the location of the SLN.  The skin flaps were raised again and then the Neoprobe used to identify three sln's, one of which was blue. These were removed and sent to pathology.   While waiting for the results  the breast was removed from the muscle and chest wall. Several areas where the flaps seemed a bit too thick were trimmed to assure all breast tissue was removed.  Once everything was dry, a moist pad was placed and Dr. Odis Luster came in to do the reconstructions.  The SLN were neg X3. EBL was about 100 cc. The patient tolerated this portion of the surgery well.  [name], MD, FACS 12/21/2011 1:53 PM

## 2011-12-21 NOTE — Transfer of Care (Signed)
Immediate Anesthesia Transfer of Care Note  Patient: Stacey Cross  Procedure(s) Performed: Procedure(s) (LRB) with comments: SIMPLE MASTECTOMY WITH AXILLARY SENTINEL NODE BIOPSY (Left) - left total mastectomy with sentinel node TOTAL MASTECTOMY (Right) - right total mastectomy TISSUE EXPANDER (Bilateral) - PLACEMENT OF BILATERAL TISSUE EXPANDERS   Patient Location: PACU  Anesthesia Type: General  Level of Consciousness: awake, sedated and patient cooperative  Airway & Oxygen Therapy: Patient Spontanous Breathing and Patient connected to nasal cannula oxygen  Post-op Assessment: Report given to PACU RN and Post -op Vital signs reviewed and stable  Post vital signs: Reviewed  Complications: No apparent anesthesia complications

## 2011-12-21 NOTE — Anesthesia Preprocedure Evaluation (Addendum)
Anesthesia Evaluation  Patient identified by MRN, date of birth, ID band Patient awake    Reviewed: Allergy & Precautions, H&P , NPO status , Patient's Chart, lab work & pertinent test results  Airway Mallampati: II TM Distance: >3 FB Neck ROM: Full    Dental  (+) Teeth Intact and Dental Advisory Given   Pulmonary  breath sounds clear to auscultation        Cardiovascular Rhythm:Regular Rate:Normal     Neuro/Psych    GI/Hepatic   Endo/Other    Renal/GU      Musculoskeletal   Abdominal   Peds  Hematology   Anesthesia Other Findings   Reproductive/Obstetrics                          Anesthesia Physical Anesthesia Plan  ASA: III  Anesthesia Plan: General   Post-op Pain Management:    Induction: Intravenous  Airway Management Planned: Oral ETT  Additional Equipment:   Intra-op Plan:   Post-operative Plan: Extubation in OR  Informed Consent: I have reviewed the patients History and Physical, chart, labs and discussed the procedure including the risks, benefits and alternatives for the proposed anesthesia with the patient or authorized representative who has indicated his/her understanding and acceptance.   Dental advisory given  Plan Discussed with: CRNA and Surgeon  Anesthesia Plan Comments: (Breast Ca S/P chemotherapy last treatment 11/24/11 WBR 2,800 with ANC 1.596, Platelets 177,000 S/P pneumothorax following portacath insertion Non-productive cough, lungs clear, CXR (-) no fevers  Plan GA with oral ETT  Kipp Brood, MD)      Anesthesia Quick Evaluation

## 2011-12-21 NOTE — Anesthesia Postprocedure Evaluation (Signed)
  Anesthesia Post-op Note  Patient: Stacey Cross  Procedure(s) Performed: Procedure(s) (LRB) with comments: SIMPLE MASTECTOMY WITH AXILLARY SENTINEL NODE BIOPSY (Left) - left total mastectomy with sentinel node TOTAL MASTECTOMY (Right) - right total mastectomy TISSUE EXPANDER (Bilateral) - PLACEMENT OF BILATERAL TISSUE EXPANDERS   Patient Location: PACU  Anesthesia Type: General  Level of Consciousness: awake, alert  and oriented  Airway and Oxygen Therapy: Patient Spontanous Breathing and Patient connected to nasal cannula oxygen  Post-op Pain: mild  Post-op Assessment: Post-op Vital signs reviewed and Patient's Cardiovascular Status Stable  Post-op Vital Signs: stable  Complications: No apparent anesthesia complications

## 2011-12-21 NOTE — Brief Op Note (Signed)
12/21/2011  2:51 PM  PATIENT:  Vista Deck Boven  41 y.o. female  PRE-OPERATIVE DIAGNOSIS:  left breast cancer and BRCA positive  POST-OPERATIVE DIAGNOSIS:  left breast cancer and BRCA positive  PROCEDURE:  Procedure(s) (LRB) with comments: SIMPLE MASTECTOMY WITH AXILLARY SENTINEL NODE BIOPSY (Left) - left total mastectomy with sentinel node TOTAL MASTECTOMY (Right) - right total mastectomy TISSUE EXPANDER (Bilateral) - PLACEMENT OF BILATERAL TISSUE EXPANDERS   SURGEON:  Surgeon(s) and Role: Panel 1:    * Christian Leta Jungling, MD - Primary    * Thomas A. Cornett, MD - Assisting  Panel 2:    * Etter Sjogren, MD - Primary  PHYSICIAN ASSISTANT:   ASSISTANTS: Magnus Ivan RNFA  ANESTHESIA:   general  EBL:  Total I/O In: 2750 [I.V.:2500; IV Piggyback:250] Out: 640 [Urine:465; Blood:175]  BLOOD ADMINISTERED:none  DRAINS: (4) Jackson-Pratt drain(s) with closed bulb suction in the left (2) and right (2)   LOCAL MEDICATIONS USED:  NONE  SPECIMEN:  No Specimen  DISPOSITION OF SPECIMEN:  N/A  COUNTS:  YES  TOURNIQUET:  * No tourniquets in log *  DICTATION: .Other Dictation: Dictation Number [id number]  PLAN OF CARE: Admit to inpatient   PATIENT DISPOSITION:  PACU - hemodynamically stable.   Delay start of Pharmacological VTE agent (>24hrs) due to surgical blood loss or risk of bleeding: no

## 2011-12-22 ENCOUNTER — Encounter (HOSPITAL_COMMUNITY): Payer: Self-pay | Admitting: Surgery

## 2011-12-22 MED ORDER — ENOXAPARIN SODIUM 40 MG/0.4ML ~~LOC~~ SOLN
40.0000 mg | SUBCUTANEOUS | Status: DC
  Administered 2011-12-22: 40 mg via SUBCUTANEOUS
  Filled 2011-12-22 (×2): qty 0.4

## 2011-12-22 MED ORDER — HYDROMORPHONE HCL 2 MG PO TABS
2.0000 mg | ORAL_TABLET | ORAL | Status: DC | PRN
  Administered 2011-12-22 – 2011-12-23 (×6): 4 mg via ORAL
  Filled 2011-12-22 (×6): qty 2

## 2011-12-22 NOTE — Progress Notes (Signed)
Subjective: No nausea. Good pain control. She feels tired.  Objective: Vital signs in last 24 hours: Temp:  [97.2 F (36.2 C)-98.8 F (37.1 C)] 98.8 F (37.1 C) (09/06 0937) Pulse Rate:  [56-81] 68  (09/06 0937) Resp:  [7-23] 16  (09/06 0937) BP: (97-140)/(48-86) 140/61 mmHg (09/06 0937) SpO2:  [95 %-100 %] 95 % (09/06 0937) FiO2 (%):  [97 %-100 %] 100 % (09/06 0735)  Intake/Output from previous day: 09/05 0701 - 09/06 0700 In: 4801.7 [P.O.:480; I.V.:4071.7; IV Piggyback:250] Out: 2027 [Urine:1540; Drains:287; Blood:200] Intake/Output this shift: Total I/O In: 360 [P.O.:360] Out: 350 [Urine:350]  Operative sites: Mastectomy flaps look very good. No evidence of vascular compromise. Tissue expanders appear to be in good position. No evidence of bleeding or infection. Drains functioning. Drainage thin.  No results found for this basename: WBC:2,HGB:2,HCT:2,PLATELETS:2,NA:2,K:2,CL:2,CO2:2,BUN:2,CREATININE:2,GLU:2 in the last 72 hours  Studies/Results: Dg Chest 2 View  12/20/2011  *RADIOLOGY REPORT*  Clinical Data: Persistent cough  CHEST - 2 VIEW  Comparison: 10/12/11  Findings: Cardiomediastinal silhouette is stable.  Right subclavian Port-A-Cath is unchanged in position.  No acute infiltrate or pleural effusion.  No pulmonary edema.  Bony thorax is stable.  IMPRESSION: No active disease.  No significant change.   Original Report Authenticated By: Natasha Mead, M.D.    Nm Sentinel Node Inj-no Rpt (breast)  12/21/2011  CLINICAL DATA: breast cancer left breast   Sulfur colloid was injected intradermally by the nuclear medicine  technologist for breast cancer sentinel node localization.      Assessment/Plan: Ambulate. Saline lock IV. PO pain med. Lovenox for DVT prophylaxis.  LOS: 1 day    Rossie Muskrat 12/22/2011 11:32 AM

## 2011-12-22 NOTE — Progress Notes (Signed)
1 Day Post-Op  Subjective: Feels OK, a bit groggy - pain controlled with PCA.   Objective: Vital signs in last 24 hours: Temp:  [97.2 F (36.2 C)-98.3 F (36.8 C)] 98.3 F (36.8 C) (09/06 0521) Pulse Rate:  [56-81] 66  (09/06 0521) Resp:  [7-23] 18  (09/06 0735) BP: (97-131)/(48-86) 98/50 mmHg (09/06 0521) SpO2:  [97 %-100 %] 100 % (09/06 0735) FiO2 (%):  [97 %-100 %] 100 % (09/06 0735)   Intake/Output from previous day: 09/05 0701 - 09/06 0700 In: 4801.7 [P.O.:480; I.V.:4071.7; IV Piggyback:250] Out: 2027 [Urine:1540; Drains:287; Blood:200] Intake/Output this shift:     General appearance: alert and no distress Resp: clear to auscultation bilaterally  Incision: Dressings dry, JPs thin  Lab Results:  No results found for this basename: WBC:2,HGB:2,HCT:2,PLT:2 in the last 72 hours BMET No results found for this basename: NA:2,K:2,CL:2,CO2:2,GLUCOSE:2,BUN:2,CREATININE:2,CALCIUM:2 in the last 72 hours PT/INR No results found for this basename: LABPROT:2,INR:2 in the last 72 hours ABG No results found for this basename: PHART:2,PCO2:2,PO2:2,HCO3:2 in the last 72 hours  MEDS, Scheduled    .  ceFAZolin (ANCEF) IV  1 g Intravenous Q8H  .  ceFAZolin (ANCEF) IV  2 g Intravenous 60 min Pre-Op  . docusate sodium  100 mg Oral Daily  . enoxaparin (LOVENOX) injection  40 mg Subcutaneous Once  . HYDROmorphone      . HYDROmorphone      . HYDROmorphone PCA 0.3 mg/mL   Intravenous Q4H  . methocarbamol  500 mg Oral QID  . DISCONTD:  ceFAZolin (ANCEF) IV  2 g Intravenous 60 min Pre-Op  . DISCONTD: chlorhexidine  1 application Topical Once  . DISCONTD: chlorhexidine  1 application Topical Once  . DISCONTD: chlorhexidine  1 application Topical Once  . DISCONTD: chlorhexidine  1 application Topical Once  . DISCONTD: HYDROmorphone PCA 0.3 mg/mL        Studies/Results: Dg Chest 2 View  12/20/2011  *RADIOLOGY REPORT*  Clinical Data: Persistent cough  CHEST - 2 VIEW  Comparison:  10/12/11  Findings: Cardiomediastinal silhouette is stable.  Right subclavian Port-A-Cath is unchanged in position.  No acute infiltrate or pleural effusion.  No pulmonary edema.  Bony thorax is stable.  IMPRESSION: No active disease.  No significant change.   Original Report Authenticated By: Natasha Mead, M.D.    Nm Sentinel Node Inj-no Rpt (breast)  12/21/2011  CLINICAL DATA: breast cancer left breast   Sulfur colloid was injected intradermally by the nuclear medicine  technologist for breast cancer sentinel node localization.      Assessment: s/p Procedure(s): SIMPLE MASTECTOMY WITH AXILLARY SENTINEL NODE BIOPSY TOTAL MASTECTOMY TISSUE EXPANDER Patient Active Problem List  Diagnosis  . Cancer of upper-outer quadrant of female breast  . Neutropenia associated with mucositis  . BRCA1 positive  . Fatigue  . Chemotherapy induced thrombocytopenia    Stable  Plan: Per Dr Odis Luster   LOS: 1 day     [name], MD, [facility], [place] [phone]   12/22/2011 7:37 AM

## 2011-12-22 NOTE — Op Note (Signed)
Stacey Cross, COKE NO.:  [id number]  MEDICAL RECORD NO.:  [id number]  LOCATION:  6N23C                        FACILITY:  [facility]  PHYSICIAN:  Etter Sjogren, M.D.     DATE OF BIRTH:  08/08/1970  DATE OF PROCEDURE:  12/21/2011 DATE OF DISCHARGE:                              OPERATIVE REPORT   PREOPERATIVE DIAGNOSIS:  Breast cancer.  POSTOPERATIVE DIAGNOSIS:  Breast cancer.  PROCEDURE PERFORMED:  Bilateral breast reconstruction with tissue expanders.  SURGEON:  Etter Sjogren, MD  ASSISTANT:  Magnus Ivan, RNFA  ANESTHESIA:  General.  ESTIMATED BLOOD LOSS:  60 mL.  DRAINS:  Two 19-French on each side.  CLINICAL INDICATION:  A 41 year old woman has breast cancer, is BRCA positive and is having bilateral mastectomy and bilateral reconstructions with tissue expander.  The nature of this procedure and the risks and possible complications were discussed with her in detail. Risks include, but not limited to bleeding, infection, anesthesia complications, healing problems, scarring, loss of sensation, fluid accumulations, pneumothorax, pulmonary embolism, failure of device, capsular contracture, displacement of device, wrinkles, ripples, disappointment, chronic pain.  She understood all this and wished to proceed.  DESCRIPTION OF PROCEDURE:  The patient was in the operating room bilateral mastectomy completed.  The skin flaps were inspected and found to be in excellent condition.  Excellent vascularity bright red bleeding along the periphery consistent with viability.  The spaces were irrigated thoroughly with saline and hemostasis with electrocautery. The dissection was carried deep to the pectoralis major muscles and laterally deep to the serratus anterior for a short distance.  The submuscular space was developed to the dimensions of the implants. Thorough irrigation with saline and irrigation with antibiotic solution. Meticulous hemostasis with the  electrocautery.  Great care taken throughout the procedure to avoid damage to underlying chest cavity. The tissue expanders were prepared after thoroughly cleaning gloves. These were Mentor moderate height 550 mL tissue expanders.  100 mL of sterile saline placed using a closed filling system and all the air removed, and the expanders were soaked in antibiotic solution. Antibiotic solution also placed in the submuscular space.  The check of the space was made, excellent hemostasis confirmed, and the expanders were positioned.  The closure with 3-0 Vicryl interrupted figure-of- eight sutures taking great care to avoid damage to underlying tissue expanders which were kept under direct vision all times.  Antibiotic solution placed again around the implant just prior to completing the muscle closure.  Two 19-French drains were positioned on each side, brought out through separate stab wounds inferolaterally and secured with 3-0 Prolene sutures.  The mastectomy space was again inspected. Meticulous hemostasis with electrocautery.  The skin flaps looked good, and the skin closure with 3-0 Monocryl and inverted deep dermal sutures and Dermabond.  The Biopatches and Tegaderm were placed around the drains for drain dressings.  ABDs and the breast binder placed, and she was transferred to the recovery room stable having tolerated the procedure well.     Etter Sjogren, M.D.     DB/MEDQ  D:  12/21/2011  T:  12/22/2011  Job:  [id number]

## 2011-12-23 MED ORDER — CEPHALEXIN 500 MG PO CAPS
500.0000 mg | ORAL_CAPSULE | ORAL | Status: DC
  Administered 2011-12-23: 500 mg via ORAL
  Filled 2011-12-23: qty 1

## 2011-12-23 MED ORDER — DSS 100 MG PO CAPS: 100.0000 mg | ORAL_CAPSULE | Freq: Two times a day (BID) | ORAL | Status: AC

## 2011-12-23 MED ORDER — ENOXAPARIN SODIUM 40 MG/0.4ML ~~LOC~~ SOLN: 40.0000 mg | SUBCUTANEOUS | Status: DC

## 2011-12-23 MED ORDER — CEPHALEXIN 500 MG PO CAPS: 500.0000 mg | ORAL_CAPSULE | ORAL | Status: AC

## 2011-12-23 MED ORDER — HYDROMORPHONE HCL 2 MG PO TABS: 2.0000 mg | ORAL_TABLET | ORAL | Status: AC | PRN

## 2011-12-23 MED ORDER — ENOXAPARIN (LOVENOX) PATIENT EDUCATION KIT
PACK | Freq: Once | Status: AC
  Administered 2011-12-23: 10:00:00
  Filled 2011-12-23: qty 1

## 2011-12-23 MED ORDER — METHOCARBAMOL 500 MG PO TABS: 500.0000 mg | ORAL_TABLET | Freq: Four times a day (QID) | ORAL | Status: AC

## 2011-12-23 NOTE — Discharge Summary (Signed)
No lifting for 6 weeks No vigorous activity for 6 weeks (including outdoor walks) No driving for 4 weeks OK to walk up stairs slowly Stay propped up Use incentive spirometer at home every hour while awake No shower while drains are in place Empty drains at least three times a day and record the amounts separately Change drain dressings every third day if instructed to do so by Dr. Odis Luster  Apply Bacitracin antibiotic ointment to the drain sites  Place gauze dressing over drains  Secure the gauze with tape Take an over-the-counter Probiotic while on antibiotics Take an over-the-counter stool softener (such as Colace) while on pain medication  Begin Lovenox at home tonight at 6 pm and give it same time every night

## 2011-12-23 NOTE — Progress Notes (Signed)
2 Days Post-Op  Subjective: Feels better, pain well controlled, would like to go home today  Objective: Vital signs in last 24 hours: Temp:  [98.8 F (37.1 C)-100.1 F (37.8 C)] 100.1 F (37.8 C) (09/07 0618) Pulse Rate:  [68-102] 102  (09/07 0618) Resp:  [16-18] 18  (09/07 0618) BP: (123-144)/(44-68) 135/66 mmHg (09/07 0618) SpO2:  [95 %-100 %] 97 % (09/07 0618) Weight:  [145 lb (65.772 kg)] 145 lb (65.772 kg) (09/07 0541)   Intake/Output from previous day: 09/06 0701 - 09/07 0700 In: 1670 [P.O.:600; I.V.:970; IV Piggyback:100] Out: 881.5 [Urine:700; Drains:181.5] Intake/Output this shift:     General appearance: fatigued and no distress Resp: clear to auscultation bilaterally  Incision: JP's slowing down  Lab Results:  No results found for this basename: WBC:2,HGB:2,HCT:2,PLT:2 in the last 72 hours BMET No results found for this basename: NA:2,K:2,CL:2,CO2:2,GLUCOSE:2,BUN:2,CREATININE:2,CALCIUM:2 in the last 72 hours PT/INR No results found for this basename: LABPROT:2,INR:2 in the last 72 hours ABG No results found for this basename: PHART:2,PCO2:2,PO2:2,HCO3:2 in the last 72 hours  MEDS, Scheduled    .  ceFAZolin (ANCEF) IV  1 g Intravenous Q8H  . docusate sodium  100 mg Oral Daily  . enoxaparin (LOVENOX) injection  40 mg Subcutaneous Q24H  . methocarbamol  500 mg Oral QID  . DISCONTD: HYDROmorphone PCA 0.3 mg/mL   Intravenous Q4H    Studies/Results: Nm Sentinel Node Inj-no Rpt (breast)  12/21/2011  CLINICAL DATA: breast cancer left breast   Sulfur colloid was injected intradermally by the nuclear medicine  technologist for breast cancer sentinel node localization.      Assessment: s/p Procedure(s): SIMPLE MASTECTOMY WITH AXILLARY SENTINEL NODE BIOPSY TOTAL MASTECTOMY TISSUE EXPANDER Patient Active Problem List  Diagnosis  . Cancer of upper-outer quadrant of female breast  . Neutropenia associated with mucositis  . BRCA1 positive  . Fatigue  .  Chemotherapy induced thrombocytopenia    Doing well, may be able to go home later today  Plan: Per Dr Odis Luster - Path pending and I will call her when available   LOS: 2 days     [name], MD, [facility], [place] [phone]   12/23/2011 8:33 AM

## 2011-12-23 NOTE — Discharge Summary (Signed)
Physician Discharge Summary  Patient ID: Stacey Cross MRN: [id number] DOB/AGE: 43-Jan-1972 43 y.o.  Admit date: 12/21/2011 Discharge date: 12/23/2011  Admission Diagnoses:Breast cancer  Discharge Diagnoses: Same Principal Problem:  *Cancer of upper-outer quadrant of female breast Active Problems:  BRCA1 positive   Discharged Condition: good  Hospital Course: On the day of admission the patient was taken to surgery and had bilateral mastectomy, left sentinel node, and bilateral reconstruction with tissue expanders. The patient tolerated the procedures well. Postoperatively, the mastectomy flaps maintained excellent color and capillary refill. The patient was ambulatory and tolerating diet on the first postoperative day. DVT prohylaxis was begun pre-op and continued after surgery. She feels she is ready for discharge. Good pain control.  Treatments: antibiotics: Ancef, anticoagulation: LMW heparin and surgery: bilateral mastectomy, left sentinel node, and reconstruction with tissue expanders  Discharge Exam: Blood pressure 135/66, pulse 102, temperature 100.1 F (37.8 C), temperature source Oral, resp. rate 18, height 5\' 5"  (1.651 m), weight 145 lb (65.772 kg), last menstrual period 05/19/2011, SpO2 97.00%.  Operative sites: Mastectomy flaps viable. Excellent color. Tissue expanders in good position. Drains functioning. Drainage thin. No evidence of bleeding or infection.  Disposition: 01-Home or Self Care  Discharge Orders    Future Appointments: Provider: Department: Dept Phone: Center:   01/03/2012 8:30 AM Delcie Roch [facility] [phone] None   01/03/2012 9:00 AM Victorino December, MD [facility] [phone] None     Medication List  As of 12/23/2011  8:56 AM   STOP taking these medications         ciprofloxacin 500 MG tablet      multivitamin with minerals Tabs         TAKE these medications         b complex vitamins tablet   Take 1 tablet by mouth  daily.      CALCIUM PO   Take 3 tablets by mouth 2 (two) times daily.      cephALEXin 500 MG capsule   Commonly known as: KEFLEX   Take 1 capsule (500 mg total) by mouth 1 day or 1 dose.      DSS 100 MG Caps   Take 100 mg by mouth 2 (two) times daily.      enoxaparin 40 MG/0.4ML injection   Commonly known as: LOVENOX   Inject 0.4 mLs (40 mg total) into the skin daily.      HYDROmorphone 2 MG tablet   Commonly known as: DILAUDID   Take 1-2 tablets (2-4 mg total) by mouth every 4 (four) hours as needed for pain.      methocarbamol 500 MG tablet   Commonly known as: ROBAXIN   Take 1 tablet (500 mg total) by mouth 4 (four) times daily.      PROBIOTIC PO   Take 1 capsule by mouth daily.             SignedOdis Luster, Cayle Cordoba M 12/23/2011, 8:56 AM

## 2011-12-23 NOTE — Progress Notes (Signed)
Pt states that she has already completed treatment for staph positive pcr.

## 2011-12-23 NOTE — Progress Notes (Signed)
Patient and spouse present for discharge instructions. Prescriptions given and gone over. Lovenox kit given. Home medications gone over. Incisional care and drain care gone over. Follow up appointments gone over. Patient verbalized lovenox instructions, said her mother is comfortable giving her the injections. Activity and position were discussed. Patient did state she understood not to allow heart rate to be elevated.  Demonstrated how to empty jp bulbs and record measurement. Signs and symptoms of infection gone over.

## 2011-12-23 NOTE — Progress Notes (Signed)
Went in to talk with pt.  Gave her Breast Cancer Bag and explained JP measuring container for home, arm precautions, ABC class once approved by Dr. Jamey Ripa and support groups.  Discussed pain management and activity.   Gave 2 support pillows for arms for comfort.

## 2011-12-25 ENCOUNTER — Encounter: Payer: Self-pay | Admitting: Oncology

## 2011-12-25 NOTE — Progress Notes (Signed)
Faxed surgery note to [employer]insman @ [phone] for patient's disability claim.

## 2011-12-26 ENCOUNTER — Telehealth (INDEPENDENT_AMBULATORY_CARE_PROVIDER_SITE_OTHER): Payer: Self-pay | Admitting: General Surgery

## 2011-12-26 NOTE — Telephone Encounter (Signed)
Patient aware path results are good. Lymph nodes negative and margins ok. She will follow up in the office at her scheduled appt and call with any questions prior.  

## 2011-12-26 NOTE — Telephone Encounter (Signed)
Message copied by Liliana Cline on Tue Dec 26, 2011  3:49 PM ------      Message from: [name]      Created: Tue Dec 26, 2011  3:13 PM       Tell the patient that her margins are OK and her lymph nodes are negative. No residual cancer - a complete pathologic response, very good to see I will discuss in detail in the office.

## 2011-12-29 ENCOUNTER — Ambulatory Visit (INDEPENDENT_AMBULATORY_CARE_PROVIDER_SITE_OTHER): Payer: BC Managed Care – PPO | Admitting: Surgery

## 2011-12-29 ENCOUNTER — Encounter (INDEPENDENT_AMBULATORY_CARE_PROVIDER_SITE_OTHER): Payer: Self-pay | Admitting: Surgery

## 2011-12-29 VITALS — BP 130/86 | HR 80 | Temp 97.8°F | Resp 16 | Ht 65.0 in | Wt 143.0 lb

## 2011-12-29 DIAGNOSIS — C50419 Malignant neoplasm of upper-outer quadrant of unspecified female breast: Secondary | ICD-10-CM

## 2011-12-29 NOTE — Patient Instructions (Signed)
See me in a month for follow up. Let me know if you have any problems

## 2011-12-29 NOTE — Progress Notes (Signed)
BERNESE JEONG    [id number] 12/29/2011    08-24-1970   CC: Post op bilateral mastectomy  HPI: The patient returns for post op follow-up. She underwent a bilateral mastectomy with left sln and bilateral tissue expander reconstruction  on 12/24/11. Over all she feels that she is doing well. She still has two drains and has an appointment with Dr Odis Luster on Monday  PE: VITAL SIGNS: BP 130/86  Pulse 80  Temp 97.8 F (36.6 C) (Temporal)  Resp 16  Ht 5\' 5"  (1.651 m)  Wt 143 lb (64.864 kg)  BMI 23.80 kg/m2  The incision is healing nicely and there is no evidence of infection or hematoma.  The drains are too much for me to remove.  DATA REVIEWED: Pathology report showed Complete path respopnse  IMPRESSION: Patient doing well.   PLAN: Her next visit will be in one month as she is doing most post op care with Dr Odis Luster. Gave her a copy of the path and discussed it with her.

## 2012-01-03 ENCOUNTER — Other Ambulatory Visit (HOSPITAL_BASED_OUTPATIENT_CLINIC_OR_DEPARTMENT_OTHER): Payer: BC Managed Care – PPO | Admitting: Lab

## 2012-01-03 ENCOUNTER — Ambulatory Visit (HOSPITAL_BASED_OUTPATIENT_CLINIC_OR_DEPARTMENT_OTHER): Payer: BC Managed Care – PPO | Admitting: Oncology

## 2012-01-03 ENCOUNTER — Telehealth: Payer: Self-pay | Admitting: *Deleted

## 2012-01-03 ENCOUNTER — Encounter: Payer: Self-pay | Admitting: Oncology

## 2012-01-03 VITALS — BP 124/91 | HR 66 | Temp 98.0°F | Resp 20 | Ht 65.0 in | Wt 141.5 lb

## 2012-01-03 DIAGNOSIS — C50419 Malignant neoplasm of upper-outer quadrant of unspecified female breast: Secondary | ICD-10-CM

## 2012-01-03 LAB — CBC WITH DIFFERENTIAL/PLATELET
Basophils Absolute: 0 10*3/uL (ref 0.0–0.1)
EOS%: 2.5 % (ref 0.0–7.0)
LYMPH%: 19.4 % (ref 14.0–49.7)
MCHC: 35.1 g/dL (ref 31.5–36.0)
MONO%: 10.7 % (ref 0.0–14.0)
NEUT#: 3.3 10*3/uL (ref 1.5–6.5)
NEUT%: 66.6 % (ref 38.4–76.8)
RBC: 3.25 10*6/uL — ABNORMAL LOW (ref 3.70–5.45)
RDW: 14.4 % (ref 11.2–14.5)
WBC: 5 10*3/uL (ref 3.9–10.3)
lymph#: 1 10*3/uL (ref 0.9–3.3)

## 2012-01-03 LAB — COMPREHENSIVE METABOLIC PANEL (CC13)
AST: 85 U/L — ABNORMAL HIGH (ref 5–34)
BUN: 7 mg/dL (ref 7.0–26.0)
Calcium: 10 mg/dL (ref 8.4–10.4)
Chloride: 101 mEq/L (ref 98–107)
Creatinine: 0.7 mg/dL (ref 0.6–1.1)

## 2012-01-03 NOTE — Patient Instructions (Addendum)
Doing well  I will see you back in 3 months  Recommend proceeding with risk reducing gyn surgery

## 2012-01-03 NOTE — Telephone Encounter (Signed)
Gave patient 04-02-2012 starting at 11:00am

## 2012-01-03 NOTE — Progress Notes (Signed)
OFFICE PROGRESS NOTE  CC  [name], MD [facility] [address] [place] [address] Dr. Chipper Herb Dr. Cyndia Bent  DIAGNOSIS: 41 year old with stage IIA ([id number]) invasive ductal carcinoma, triple negative,  With ki-67 80% measuring 3.5 x 1.0 cm diagnosed March 2013  PRIOR THERAPY:  1. Seen at [facility] with self palpated breast mass, needle core biopsy positive for IDC, triple negative, stage IIA  2. Genetic testing revealed BRCA I mutation   3. S/P port placement with complication of pneumothorax  4. S/P Neoadjuvant chemotherapy with Adriamycin/cytoxan given dose dense between 07/13/11 - 08/24/11   5. Neoadjuvant carbo/taxol begin 09/06/12 - 11/24/11 X 12 weeks completed   #6 patient has now gone bilateral mastectomies on 12/21/2011. Pathology revealed no evidence of residual cancer in the left breast. Sentinel nodes were negative for metastatic disease. Right breast without any evidence of malignancy. Patient has undergone immediate reconstruction of the breasts bilaterally.  CURRENT THERAPY:  Patient will proceed bilateral salpingo-oophorectomies. She is also undergoing immediate reconstruction  INTERVAL HISTORY: Stacey Cross 41 y.o. female returns for follow up visit. Postoperatively she is doing well Her surgery was uneventful. She did have immediate reconstruction she has expanders in place. She denies any nausea vomiting fevers chills or residual effects from her chemotherapy. She and I discussed her final pathology. She and her we are quite pleased with the results. DeniesFevers chills night sweats shortness of breath chest pains. Remaider of the 10 point review of systems is negative.  MEDICAL HISTORY: Past Medical History  Diagnosis Date  . BRCA1 positive 08/17/2011  . Fatigue 09/28/2011  . Chemotherapy induced thrombocytopenia 10/12/2011  . Pneumothorax, postprocedural 06/2011    after [facility] cath   . Shortness of breath 06/2011      "related to the collapsed lung S/P PAC placement"  . History of blood transfusion ?10/2011    "during chemo treatments"  . Breast cancer     ALLERGIES:  is allergic to adhesive.  MEDICATIONS:  Current Outpatient Prescriptions  Medication Sig Dispense Refill  . b complex vitamins tablet Take 1 tablet by mouth daily.      Marland Kitchen CALCIUM PO Take 3 tablets by mouth 2 (two) times daily.      Marland Kitchen enoxaparin (LOVENOX) 40 MG/0.4ML injection Inject 0.4 mLs (40 mg total) into the skin daily.  12 Syringe  0  . Probiotic Product (PROBIOTIC PO) Take 1 capsule by mouth daily.      . cephALEXin (KEFLEX) 500 MG capsule Take 1 capsule (500 mg total) by mouth 1 day or 1 dose.  10 capsule  0  . docusate sodium 100 MG CAPS Take 100 mg by mouth 2 (two) times daily.  30 capsule  1  . HYDROmorphone (DILAUDID) 2 MG tablet Take 1-2 tablets (2-4 mg total) by mouth every 4 (four) hours as needed for pain.  50 tablet  0  . methocarbamol (ROBAXIN) 500 MG tablet Take 1 tablet (500 mg total) by mouth 4 (four) times daily.  30 tablet  1    SURGICAL HISTORY:  Past Surgical History  Procedure Date  . Cesarean section 2003  . Portacath placement 06/23/2011    Procedure: INSERTION PORT-A-CATH;  Surgeon: [name], MD;  Location: [facility];  Service: General;  Laterality: N/A;  carm   . Mastectomy complete / simple w/ sentinel node biopsy 12/21/2011    left  . Mastectomy, radical 12/21/2011    right  .  Tissue expander placement 12/21/2011    bilateral breast area  . Dilation and curettage of uterus 2002    "as a result of miscarriage"  . Pyloric stenosis     "@ age 58 week"  . Breast biopsy 05/2011    left  . Tissue expander placement 12/21/2011    Procedure: TISSUE EXPANDER;  Surgeon: Etter Sjogren, MD;  Location: [facility] OR;  Service: Plastics;  Laterality: Bilateral;  PLACEMENT OF BILATERAL TISSUE EXPANDERS     REVIEW OF SYSTEMS:  Pertinent items are noted in HPI.   PHYSICAL EXAMINATION: General  appearance: alert, cooperative, appears stated age, fatigued, flushed and slowed mentation Neck: no adenopathy, no carotid bruit, no JVD, supple, symmetrical, trachea midline and thyroid not enlarged, symmetric, no tenderness/mass/nodules Lymph nodes: Cervical, supraclavicular, and axillary nodes normal. Resp: clear to auscultation bilaterally and normal percussion bilaterally Back: symmetric, no curvature. ROM normal. No CVA tenderness. Cardio: regular rate and rhythm, S1, S2 normal, no murmur, click, rub or gallop GI: soft, non-tender; bowel sounds normal; no masses,  no organomegaly Extremities: extremities normal, atraumatic, no cyanosis or edema Neurologic: Alert and oriented X 3, normal strength and tone. Normal symmetric reflexes. Normal coordination and gait  ECOG PERFORMANCE STATUS: 1 - Symptomatic but completely ambulatory  Blood pressure 124/91, pulse 66, temperature 98 F (36.7 C), temperature source Oral, resp. rate 20, height 5\' 5"  (1.651 m), weight 141 lb 8 oz (64.184 kg).  LABORATORY DATA: Lab Results  Component Value Date   WBC 5.0 01/03/2012   HGB 11.7 01/03/2012   HCT 33.5* 01/03/2012   MCV 103.0* 01/03/2012   PLT 299 01/03/2012      Chemistry      Component Value Date/Time   NA 140 01/03/2012 0832   NA 141 11/24/2011 0850   K 4.5 01/03/2012 0832   K 3.6 11/24/2011 0850   CL 101 01/03/2012 0832   CL 105 11/24/2011 0850   CO2 27 01/03/2012 0832   CO2 26 11/24/2011 0850   BUN 7.0 01/03/2012 0832   BUN 14 11/24/2011 0850   CREATININE 0.7 01/03/2012 0832   CREATININE 0.54 11/24/2011 0850      Component Value Date/Time   CALCIUM 10.0 01/03/2012 0832   CALCIUM 9.0 11/24/2011 0850   ALKPHOS 64 01/03/2012 0832   ALKPHOS 54 11/24/2011 0850   AST 85* 01/03/2012 0832   AST 14 11/24/2011 0850   ALT 118* 01/03/2012 0832   ALT 24 11/24/2011 0850   BILITOT 0.30 01/03/2012 0832   BILITOT 0.3 11/24/2011 0850    FINAL DIAGNOSIS Diagnosis 1. Lymph node, sentinel, biopsy, Left axillary #1 - THERE IS  NO EVIDENCE OF CARCINOMA IN 1 OF 1 LYMPH NODE (0/1). 2. Lymph node, sentinel, biopsy, Left axillary #2 - THERE IS NO EVIDENCE OF CARCINOMA IN 1 OF 1 LYMPH NODE (0/1). 3. Lymph node, sentinel, biopsy, Left axillary #3 - THERE IS NO EVIDENCE OF CARCINOMA IN 1 OF 1 LYMPH NODE (0/1). 4. Breast, simple mastectomy, right - BENIGN BREAST PARENCHYMA. - THERE IS NO EVIDENCE OF MALIGNANCY. 5. Breast, simple mastectomy, Left - CHANGES CONSISTENT WITH TREATMENT EFFECT. - HEALING BIOPSY SITE. - THERE IS NO EVIDENCE OF MALIGNANCY. - SEE COMMENT. Microscopic Comment 5. Grossly, there is a 2.2 cm area of indurated fibrous-like tissue containing a metallic clip. This area has been entirely submitted for histologic evaluation, revealing changes consistent with chemotherapy effect. There is no evidence of malignancy. Thus, the stage is ypT0. (JBK:eps 12/26/11) Pecola Leisure MD Pathologist, Electronic Signature (Case  signed 12/26/2011) Intraoperative Diag   RADIOGRAPHIC STUDIES:  ASSESSMENT: 42 year old female with  1. Stage II invasive ductal carcinoma s/p bilateral mastectomies and immediate reconstruction. Final pathology revealed no evidence of residual cancer. All lymph nodes were negative for metastatic disease.  2. Patient is a BRCA carrier and should proceed with bilateral salpingo-oophorectomy   PLAN:  1. I will see the patient back in 3 months.   2. She she is cleared from my perspective to proceed with her gyn surgery at this point.  All questions were answered. The patient knows to call the clinic with any problems, questions or concerns. We can certainly see the patient much sooner if necessary.  I spent >25 minutes counseling the patient face to face. The total time spent in the appointment was 30 minutes.    Drue Second, MD Medical/Oncology [facility] [phone] (beeper) [phone] (Office)  01/03/2012, 10:45 AM

## 2012-01-22 ENCOUNTER — Other Ambulatory Visit: Payer: Self-pay | Admitting: Obstetrics and Gynecology

## 2012-01-26 ENCOUNTER — Encounter (HOSPITAL_COMMUNITY): Payer: Self-pay | Admitting: Pharmacy Technician

## 2012-01-26 ENCOUNTER — Encounter (HOSPITAL_COMMUNITY): Payer: Self-pay

## 2012-01-26 ENCOUNTER — Encounter (HOSPITAL_COMMUNITY)
Admission: RE | Admit: 2012-01-26 | Discharge: 2012-01-26 | Disposition: A | Discharge status: Home / Self Care | Payer: BC Managed Care – PPO | Source: Ambulatory Visit | Attending: Obstetrics and Gynecology | Admitting: Obstetrics and Gynecology

## 2012-01-26 HISTORY — DX: Presence of other vascular implants and grafts: Z95.828

## 2012-01-26 LAB — CBC
HCT: 36.3 % (ref 36.0–46.0)
Hemoglobin: 12.4 g/dL (ref 12.0–15.0)
MCHC: 34.2 g/dL (ref 30.0–36.0)

## 2012-01-26 LAB — SURGICAL PCR SCREEN: Staphylococcus aureus: NEGATIVE

## 2012-01-26 NOTE — Patient Instructions (Addendum)
   Your procedure is scheduled AV:[id number] October 24th  Enter through the Main Entrance of [facility] at:10am Pick up the phone at the desk and dial [phone] and inform us of your arrival.  Please call this number if you have any problems the morning of surgery: [phone]  Remember: Do not eat or drink anything after midnight Wednesday. You may have some water until 7:30am Thursday morning  Do not wear jewelry, make-up, or FINGER nail polish No metal in your hair or on your body. Do not wear lotions, powders, perfumes. You may wear deodorant.  Please use your CHG wash as directed prior to surgery.  Do not shave anywhere for at least 12 hours prior to first CHG shower.  Do not bring valuables to the hospital. Contacts may not be worn into surgery.  Leave suitcase in the car. After Surgery it may be brought to your room. For patients being admitted to the hospital, checkout time is 11:00am the day of discharge.

## 2012-01-31 ENCOUNTER — Ambulatory Visit (INDEPENDENT_AMBULATORY_CARE_PROVIDER_SITE_OTHER): Payer: BC Managed Care – PPO | Admitting: Surgery

## 2012-01-31 ENCOUNTER — Encounter (INDEPENDENT_AMBULATORY_CARE_PROVIDER_SITE_OTHER): Payer: Self-pay | Admitting: Surgery

## 2012-01-31 VITALS — BP 112/78 | HR 72 | Resp 20 | Ht 65.0 in | Wt 147.0 lb

## 2012-01-31 DIAGNOSIS — Z09 Encounter for follow-up examination after completed treatment for conditions other than malignant neoplasm: Secondary | ICD-10-CM

## 2012-01-31 NOTE — Progress Notes (Signed)
Stacey Cross    [id number] 01/31/2012    1970/06/06   CC: Post op bilateral mastectomy  HPI: The patient returns for post op follow-up. She underwent a bilateral mastectomy with left sln and bilateral tissue expander reconstruction  on 12/24/11. Over all she feels that she is doing well. She just had her expanders expanded this am  PE: VITAL SIGNS: BP 112/78  Pulse 72  Resp 20  Ht 5\' 5"  (1.651 m)  Wt 147 lb (66.679 kg)  BMI 24.46 kg/m2  The incision is healing nicely and there is no evidence of infection or hematoma.  Limited ROM both shoulders  DATA REVIEWED:   IMPRESSION: Patient doing well.  Needs abc class  PLAN: RTC PRN here Reviewed self exam after mastectomy. OK for abc class

## 2012-01-31 NOTE — Patient Instructions (Addendum)
We will see you again on an as needed basis. Please call the office at [phone] if you have any questions or concerns. Thank you for allowing [place] to take care of you.     Stacey Cross  is OK to attend the ABC Class [name], MD, [facility], [place] [phone] 01/31/2012 1:43 PM

## 2012-02-07 MED ORDER — DEXTROSE 5 % IV SOLN
2.0000 g | INTRAVENOUS | Status: AC
  Administered 2012-02-08: 2 g via INTRAVENOUS
  Filled 2012-02-07: qty 2

## 2012-02-08 ENCOUNTER — Encounter (HOSPITAL_COMMUNITY): Payer: Self-pay | Admitting: *Deleted

## 2012-02-08 ENCOUNTER — Ambulatory Visit (HOSPITAL_COMMUNITY): Payer: BC Managed Care – PPO | Admitting: Anesthesiology

## 2012-02-08 ENCOUNTER — Observation Stay (HOSPITAL_COMMUNITY)
Admission: RE | Admit: 2012-02-08 | Discharge: 2012-02-09 | Disposition: A | Discharge status: Home / Self Care | Payer: BC Managed Care – PPO | Source: Ambulatory Visit | Attending: Obstetrics and Gynecology | Admitting: Obstetrics and Gynecology

## 2012-02-08 ENCOUNTER — Encounter (HOSPITAL_COMMUNITY): Payer: Self-pay | Admitting: Anesthesiology

## 2012-02-08 ENCOUNTER — Encounter (HOSPITAL_COMMUNITY)
Admission: RE | Disposition: A | Discharge status: Home / Self Care | Payer: Self-pay | Source: Ambulatory Visit | Attending: Obstetrics and Gynecology

## 2012-02-08 DIAGNOSIS — C50919 Malignant neoplasm of unspecified site of unspecified female breast: Secondary | ICD-10-CM | POA: Insufficient documentation

## 2012-02-08 DIAGNOSIS — N8000 Endometriosis of the uterus, unspecified: Secondary | ICD-10-CM | POA: Insufficient documentation

## 2012-02-08 DIAGNOSIS — Z170 Estrogen receptor positive status [ER+]: Secondary | ICD-10-CM | POA: Insufficient documentation

## 2012-02-08 DIAGNOSIS — N814 Uterovaginal prolapse, unspecified: Principal | ICD-10-CM | POA: Insufficient documentation

## 2012-02-08 DIAGNOSIS — C50419 Malignant neoplasm of upper-outer quadrant of unspecified female breast: Secondary | ICD-10-CM

## 2012-02-08 DIAGNOSIS — D251 Intramural leiomyoma of uterus: Secondary | ICD-10-CM | POA: Insufficient documentation

## 2012-02-08 DIAGNOSIS — N9489 Other specified conditions associated with female genital organs and menstrual cycle: Secondary | ICD-10-CM | POA: Insufficient documentation

## 2012-02-08 HISTORY — PX: LAPAROSCOPIC ASSISTED VAGINAL HYSTERECTOMY: SHX5398

## 2012-02-08 SURGERY — HYSTERECTOMY, VAGINAL, LAPAROSCOPY-ASSISTED
Anesthesia: General | Wound class: Clean Contaminated

## 2012-02-08 MED ORDER — ROCURONIUM BROMIDE 100 MG/10ML IV SOLN
INTRAVENOUS | Status: DC | PRN
  Administered 2012-02-08: 5 mg via INTRAVENOUS
  Administered 2012-02-08: 30 mg via INTRAVENOUS
  Administered 2012-02-08: 10 mg via INTRAVENOUS

## 2012-02-08 MED ORDER — FENTANYL CITRATE 0.05 MG/ML IJ SOLN
INTRAMUSCULAR | Status: AC
  Filled 2012-02-08: qty 5

## 2012-02-08 MED ORDER — DEXTROSE-NACL 5-0.45 % IV SOLN
INTRAVENOUS | Status: DC
  Administered 2012-02-08 – 2012-02-09 (×3): via INTRAVENOUS

## 2012-02-08 MED ORDER — METOCLOPRAMIDE HCL 5 MG/ML IJ SOLN: 10.0000 mg | Freq: Once | INTRAMUSCULAR | Status: DC | PRN

## 2012-02-08 MED ORDER — FENTANYL CITRATE 0.05 MG/ML IJ SOLN
INTRAMUSCULAR | Status: DC | PRN
  Administered 2012-02-08: 100 ug via INTRAVENOUS
  Administered 2012-02-08: 50 ug via INTRAVENOUS
  Administered 2012-02-08: 100 ug via INTRAVENOUS

## 2012-02-08 MED ORDER — DEXAMETHASONE SODIUM PHOSPHATE 10 MG/ML IJ SOLN
INTRAMUSCULAR | Status: DC | PRN
  Administered 2012-02-08: 10 mg via INTRAVENOUS

## 2012-02-08 MED ORDER — PROPOFOL 10 MG/ML IV EMUL
INTRAVENOUS | Status: DC | PRN
  Administered 2012-02-08: 180 mg via INTRAVENOUS

## 2012-02-08 MED ORDER — MIDAZOLAM HCL 2 MG/2ML IJ SOLN
INTRAMUSCULAR | Status: AC
  Filled 2012-02-08: qty 2

## 2012-02-08 MED ORDER — BUPIVACAINE HCL (PF) 0.25 % IJ SOLN
INTRAMUSCULAR | Status: AC
  Filled 2012-02-08: qty 30

## 2012-02-08 MED ORDER — DOCUSATE SODIUM 100 MG PO CAPS
100.0000 mg | ORAL_CAPSULE | Freq: Two times a day (BID) | ORAL | Status: DC
  Administered 2012-02-08 – 2012-02-09 (×2): 100 mg via ORAL
  Filled 2012-02-08 (×2): qty 1

## 2012-02-08 MED ORDER — SODIUM CHLORIDE 0.9 % IV SOLN: INTRAVENOUS | Status: DC

## 2012-02-08 MED ORDER — OXYCODONE-ACETAMINOPHEN 5-325 MG PO TABS
1.0000 | ORAL_TABLET | ORAL | Status: DC | PRN
  Administered 2012-02-09 (×2): 2 via ORAL
  Filled 2012-02-08 (×2): qty 2

## 2012-02-08 MED ORDER — NEOSTIGMINE METHYLSULFATE 1 MG/ML IJ SOLN
INTRAMUSCULAR | Status: DC | PRN
  Administered 2012-02-08: 2 mg via INTRAVENOUS

## 2012-02-08 MED ORDER — PHENYLEPHRINE 40 MCG/ML (10ML) SYRINGE FOR IV PUSH (FOR BLOOD PRESSURE SUPPORT)
PREFILLED_SYRINGE | INTRAVENOUS | Status: AC
  Filled 2012-02-08: qty 5

## 2012-02-08 MED ORDER — ZOLPIDEM TARTRATE 5 MG PO TABS: 5.0000 mg | ORAL_TABLET | Freq: Every evening | ORAL | Status: DC | PRN

## 2012-02-08 MED ORDER — SCOPOLAMINE 1 MG/3DAYS TD PT72
1.0000 | MEDICATED_PATCH | TRANSDERMAL | Status: DC
  Administered 2012-02-08: 1.5 mg via TRANSDERMAL

## 2012-02-08 MED ORDER — HYDROMORPHONE HCL PF 1 MG/ML IJ SOLN
0.2500 mg | INTRAMUSCULAR | Status: DC | PRN
  Administered 2012-02-08 (×4): 0.5 mg via INTRAVENOUS

## 2012-02-08 MED ORDER — LIDOCAINE HCL (CARDIAC) 20 MG/ML IV SOLN
INTRAVENOUS | Status: AC
  Filled 2012-02-08: qty 5

## 2012-02-08 MED ORDER — LIDOCAINE HCL (CARDIAC) 20 MG/ML IV SOLN
INTRAVENOUS | Status: DC | PRN
  Administered 2012-02-08: 50 mg via INTRAVENOUS

## 2012-02-08 MED ORDER — DEXAMETHASONE SODIUM PHOSPHATE 10 MG/ML IJ SOLN
INTRAMUSCULAR | Status: AC
  Filled 2012-02-08: qty 1

## 2012-02-08 MED ORDER — SCOPOLAMINE 1 MG/3DAYS TD PT72
MEDICATED_PATCH | TRANSDERMAL | Status: AC
  Administered 2012-02-08: 1.5 mg via TRANSDERMAL
  Filled 2012-02-08: qty 1

## 2012-02-08 MED ORDER — PHENYLEPHRINE HCL 10 MG/ML IJ SOLN
INTRAMUSCULAR | Status: DC | PRN
  Administered 2012-02-08 (×2): 80 ug via INTRAVENOUS

## 2012-02-08 MED ORDER — HYDROMORPHONE HCL PF 1 MG/ML IJ SOLN
INTRAMUSCULAR | Status: AC
  Administered 2012-02-08: 0.5 mg via INTRAVENOUS
  Filled 2012-02-08: qty 1

## 2012-02-08 MED ORDER — ONDANSETRON HCL 4 MG/2ML IJ SOLN
INTRAMUSCULAR | Status: AC
  Filled 2012-02-08: qty 2

## 2012-02-08 MED ORDER — BUPIVACAINE HCL (PF) 0.25 % IJ SOLN
INTRAMUSCULAR | Status: DC | PRN
  Administered 2012-02-08: 6 mL

## 2012-02-08 MED ORDER — MIDAZOLAM HCL 5 MG/5ML IJ SOLN
INTRAMUSCULAR | Status: DC | PRN
  Administered 2012-02-08: 2 mg via INTRAVENOUS

## 2012-02-08 MED ORDER — MEPERIDINE HCL 25 MG/ML IJ SOLN
INTRAMUSCULAR | Status: AC
  Filled 2012-02-08: qty 1

## 2012-02-08 MED ORDER — ONDANSETRON HCL 4 MG/2ML IJ SOLN
INTRAMUSCULAR | Status: DC | PRN
  Administered 2012-02-08: 4 mg via INTRAVENOUS

## 2012-02-08 MED ORDER — LIDOCAINE-EPINEPHRINE 2 %-1:100000 IJ SOLN
INTRAMUSCULAR | Status: AC
  Filled 2012-02-08: qty 1

## 2012-02-08 MED ORDER — MEPERIDINE HCL 25 MG/ML IJ SOLN: 6.2500 mg | INTRAMUSCULAR | Status: DC | PRN

## 2012-02-08 MED ORDER — PROPOFOL 10 MG/ML IV EMUL
INTRAVENOUS | Status: AC
  Filled 2012-02-08: qty 20

## 2012-02-08 MED ORDER — GLYCOPYRROLATE 0.2 MG/ML IJ SOLN
INTRAMUSCULAR | Status: DC | PRN
  Administered 2012-02-08: 0.4 mg via INTRAVENOUS

## 2012-02-08 MED ORDER — LIDOCAINE-EPINEPHRINE 1 %-1:100000 IJ SOLN
INTRAMUSCULAR | Status: DC | PRN
  Administered 2012-02-08: 6 mL

## 2012-02-08 MED ORDER — LACTATED RINGERS IV SOLN
INTRAVENOUS | Status: DC
  Administered 2012-02-08 (×3): via INTRAVENOUS

## 2012-02-08 MED ORDER — HYDROMORPHONE HCL PF 1 MG/ML IJ SOLN
0.2000 mg | INTRAMUSCULAR | Status: DC | PRN
  Administered 2012-02-08 (×2): 0.6 mg via INTRAVENOUS
  Filled 2012-02-08 (×2): qty 1

## 2012-02-08 MED ORDER — INFLUENZA VIRUS VACC SPLIT PF IM SUSP
0.5000 mL | INTRAMUSCULAR | Status: AC
  Administered 2012-02-09: 0.5 mL via INTRAMUSCULAR
  Filled 2012-02-08: qty 0.5

## 2012-02-08 SURGICAL SUPPLY — 34 items
CATH ROBINSON RED A/P 16FR (CATHETERS) IMPLANT
CLOTH BEACON ORANGE TIMEOUT ST (SAFETY) ×3 IMPLANT
COVER TABLE BACK 60X90 (DRAPES) ×3 IMPLANT
DECANTER SPIKE VIAL GLASS SM (MISCELLANEOUS) IMPLANT
DERMABOND ADVANCED (GAUZE/BANDAGES/DRESSINGS) ×1
DERMABOND ADVANCED .7 DNX12 (GAUZE/BANDAGES/DRESSINGS) ×2 IMPLANT
ELECT REM PT RETURN 9FT ADLT (ELECTROSURGICAL)
ELECTRODE REM PT RTRN 9FT ADLT (ELECTROSURGICAL) IMPLANT
EVACUATOR SMOKE 8.L (FILTER) IMPLANT
FORCEPS CUTTING 33CM 5MM (CUTTING FORCEPS) ×3 IMPLANT
GLOVE ECLIPSE 7.0 STRL STRAW (GLOVE) ×6 IMPLANT
GOWN PREVENTION PLUS LG XLONG (DISPOSABLE) ×9 IMPLANT
GOWN PREVENTION PLUS XLARGE (GOWN DISPOSABLE) ×3 IMPLANT
NS IRRIG 1000ML POUR BTL (IV SOLUTION) ×3 IMPLANT
PACK LAVH (CUSTOM PROCEDURE TRAY) ×3 IMPLANT
PROTECTOR NERVE ULNAR (MISCELLANEOUS) ×3 IMPLANT
SCISSORS LAP 5X35 DISP (ENDOMECHANICALS) IMPLANT
SET IRRIG TUBING LAPAROSCOPIC (IRRIGATION / IRRIGATOR) IMPLANT
SOLUTION ELECTROLUBE (MISCELLANEOUS) ×3 IMPLANT
STRIP CLOSURE SKIN 1/4X3 (GAUZE/BANDAGES/DRESSINGS) IMPLANT
SUT MNCRL 0 MO-4 VIOLET 18 CR (SUTURE) ×6 IMPLANT
SUT MNCRL 0 VIOLET 6X18 (SUTURE) ×2 IMPLANT
SUT MONOCRYL 0 6X18 (SUTURE) ×1
SUT MONOCRYL 0 MO 4 18  CR/8 (SUTURE) ×3
SUT VIC AB 2-0 CT1 27 (SUTURE) ×4
SUT VIC AB 2-0 CT1 TAPERPNT 27 (SUTURE) ×8 IMPLANT
SUT VICRYL 0 UR6 27IN ABS (SUTURE) ×6 IMPLANT
SUT VICRYL RAPIDE 3 0 (SUTURE) ×3 IMPLANT
TOWEL OR 17X24 6PK STRL BLUE (TOWEL DISPOSABLE) ×6 IMPLANT
TROCAR BALLN 12MMX100 BLUNT (TROCAR) ×3 IMPLANT
TROCAR Z-THREAD BLADED 5X100MM (TROCAR) ×6 IMPLANT
TROCAR Z-THREAD FIOS 5X100MM (TROCAR) IMPLANT
WARMER LAPAROSCOPE (MISCELLANEOUS) ×3 IMPLANT
WATER STERILE IRR 1000ML POUR (IV SOLUTION) ×3 IMPLANT

## 2012-02-08 NOTE — Anesthesia Postprocedure Evaluation (Signed)
  Anesthesia Post-op Note  Patient: Stacey Cross  Procedure(s) Performed: Procedure(s) (LRB) with comments: LAPAROSCOPIC ASSISTED VAGINAL HYSTERECTOMY (N/A) LAPAROSCOPIC BILATERAL SALPINGO OOPHERECTOMY (Bilateral)  Patient is awake and responsive. Pain and nausea are reasonably well controlled. Vital signs are stable and clinically acceptable. Oxygen saturation is clinically acceptable. There are no apparent anesthetic complications at this time. Patient is ready for discharge.

## 2012-02-08 NOTE — Transfer of Care (Signed)
Immediate Anesthesia Transfer of Care Note  Patient: Stacey Cross  Procedure(s) Performed: Procedure(s) (LRB) with comments: LAPAROSCOPIC ASSISTED VAGINAL HYSTERECTOMY (N/A) LAPAROSCOPIC BILATERAL SALPINGO OOPHERECTOMY (Bilateral)  Patient Location: PACU  Anesthesia Type: General  Level of Consciousness: sedated  Airway & Oxygen Therapy: Patient Spontanous Breathing and Patient connected to nasal cannula oxygen  Post-op Assessment: Report given to PACU RN and Post -op Vital signs reviewed and stable  Post vital signs: Reviewed and stable  Complications: No apparent anesthesia complications

## 2012-02-08 NOTE — H&P (Signed)
Pt is a 41 year old white female who presents for a LAVHBSO secondary to a positive BRAC test.  Pt was diagnosed with Stage II breast cancer 2013.  After her BRAC test was positive her oncologist suggested that she undergo removal of her ovaries.  Pt has mild SUI. She was offered a BSO, LAVHBSO, and a LAVHBSO TVT and elected to have a LAVHBSO Pmhx:  NKDA             Meds-Tylenol             S/P C/S x1             S/P SVD x2 PE: VSSAF         HEENT- wnl         CV-RRR without murmur.         Abd- soft, non tender         Pelvic- deferred to the or. IMP/ +BRAC           Breast Cancer Plan/ LAVHBSO

## 2012-02-08 NOTE — Anesthesia Preprocedure Evaluation (Signed)
Anesthesia Evaluation  Patient identified by MRN, date of birth, ID band Patient awake    Reviewed: Allergy & Precautions, H&P , NPO status , Patient's Chart, lab work & pertinent test results  Airway Mallampati: II TM Distance: >3 FB Neck ROM: Full    Dental No notable dental hx. (+) Teeth Intact   Pulmonary neg pulmonary ROS, shortness of breath,  breath sounds clear to auscultation  Pulmonary exam normal       Cardiovascular negative cardio ROS  Rhythm:Regular Rate:Normal     Neuro/Psych negative neurological ROS  negative psych ROS   GI/Hepatic negative GI ROS, Neg liver ROS,   Endo/Other  Hx/o Breast Ca S/P Mastectomy  Renal/GU   negative genitourinary   Musculoskeletal negative musculoskeletal ROS (+)   Abdominal   Peds  Hematology negative hematology ROS (+)   Anesthesia Other Findings Permanent retainers  Reproductive/Obstetrics negative OB ROS                           Anesthesia Physical Anesthesia Plan  ASA: II  Anesthesia Plan: General   Post-op Pain Management:    Induction: Intravenous  Airway Management Planned: Oral ETT  Additional Equipment:   Intra-op Plan:   Post-operative Plan: Extubation in OR  Informed Consent: I have reviewed the patients History and Physical, chart, labs and discussed the procedure including the risks, benefits and alternatives for the proposed anesthesia with the patient or authorized representative who has indicated his/her understanding and acceptance.   Dental advisory given  Plan Discussed with: Anesthesiologist, CRNA and Surgeon  Anesthesia Plan Comments:         Anesthesia Quick Evaluation

## 2012-02-09 ENCOUNTER — Encounter (HOSPITAL_COMMUNITY): Payer: Self-pay | Admitting: Obstetrics and Gynecology

## 2012-02-09 LAB — CBC
MCV: 92.2 fL (ref 78.0–100.0)
Platelets: 182 10*3/uL (ref 150–400)
RBC: 4.09 MIL/uL (ref 3.87–5.11)
WBC: 7.8 10*3/uL (ref 4.0–10.5)

## 2012-02-09 NOTE — Progress Notes (Signed)
Pt ambulated out no complaints

## 2012-02-09 NOTE — Op Note (Signed)
NAMEGABBRIELLE, MCNICHOLAS NO.:  [id number]  MEDICAL RECORD NO.:  [id number]  LOCATION:  9320                          FACILITY:  [facility]  PHYSICIAN:  Malva Limes, M.D.    DATE OF BIRTH:  10-02-1970  DATE OF PROCEDURE: DATE OF DISCHARGE:                              OPERATIVE REPORT   PREOPERATIVE DIAGNOSES: 1. Stage II breast cancer. 2. Positive BRAC. 3. Uterine prolapse.  POSTOPERATIVE DIAGNOSES: 1. Stage II breast cancer. 2. Positive BRAC. 3. Uterine prolapse.  PROCEDURE:  Laparoscopic assisted vaginal hysterectomy with bilateral salpingo-oophorectomy.  SURGEON:  Malva Limes, M.D.  Threasa HeadsGreta Doom.  ANESTHESIA:  General with local.  ANTIBIOTICS:  Cefotan 2 g.  DRAINS:  Foley to bedside drainage.  ESTIMATED BLOOD LOSS:  100 mL.  SPECIMENS:  Cervix, uterus, fallopian tubes, and ovaries sent to Pathology.  COMPLICATIONS:  None.  PROCEDURE:  The patient was taken to the operating room, where general anesthetic was administered without difficulty.  She was then prepped and draped in the usual fashion for this procedure.  Her bladder was drained with a red rubber catheter.  Her umbilicus was then injected with 0.25% percent Marcaine.  A vertical skin incision was made.  The fascia was grasped and entered with the Mayo scissors.  The parietal peritoneum was entered with blunt dissection.  A 12 mm Hasson cannula was then placed into the abdominal cavity.  3 L of carbon dioxide was then insufflated.  The patient was placed in Trendelenburg and a 5-mm port was placed in the right and left lower quadrant under direct visualization.  The patient had previously had a Hulka tenaculum applied to the anterior cervical lip.  At this point, the examination revealed no evidence of any abnormalities.  Both ovaries were normal with no adhesions or endometriosis throughout the abdomen.  The patient had the left ovary grasped and pulled away from the pelvic  sidewall.  The ureter was identified.  The infundibulopelvic ligament was then cauterized and transected.  The round ligament was cauterized and transected.  The remaining broad ligament was cauterized and transected down to the uterine vessel.  A similar procedure was performed on the opposite side. At this point, all pedicles were checked and felt to be hemostatic.  The procedure was then taken to the vagina.  At this point, a weighted speculum placed in the vagina.  1% lidocaine was placed in the cervix. The posterior cul-de-sac was entered sharply.  The uterosacral ligaments were bilaterally clamped, cut, and ligated with 0 Monocryl suture.  The cervix was then circumscribed.  The anterior cul-de-sac was entered sharply.  The cardinal ligaments were serially clamped, cut, and ligated with 0 Monocryl suture.  Uterine vessels were bilaterally clamped, cut, and ligated with 0 Monocryl suture.  The specimen was then removed.  The pedicles were checked and found to be hemostatic.  The posterior cuff was then closed using 2-0 Vicryl in a running, locking fashion. Uterosacral ligaments were approximated in the midline with a 2-0 Vicryl suture.  The remaining cuff was closed using 2-0 Vicryl in a running, locking fashion.  The cuff was closed vertically.  This concluded the hysterectomy.  At this point,  the patient was then placed in Trendelenburg her abdomen was reinflated, and the scope placed in the abdominal cavity.  All pedicles were again hemostatic.  The ports were removed.  There was no evidence of bleeding.  The umbilical incision was closed using 0 Vicryl suture in a pursestring fashion.  All skin incisions were closed using Dermabond.  The patient tolerated the procedure well.  She was awoke and taken to recovery room in stable condition.  Instrument and lap counts was correct x3.  All specimens will be sent to Pathology.          ______________________________ Malva Limes,  M.D.     MA/MEDQ  D:  02/08/2012  T:  02/09/2012  Job:  [id number]

## 2012-02-09 NOTE — Addendum Note (Signed)
Addendum  created 02/09/12 0813 by Jhonnie Garner, CRNA   Modules edited:Notes Section

## 2012-02-09 NOTE — Anesthesia Postprocedure Evaluation (Signed)
Anesthesia Post Note  Patient: Stacey Cross  Procedure(s) Performed: Procedure(s) (LRB): LAPAROSCOPIC ASSISTED VAGINAL HYSTERECTOMY (N/A) LAPAROSCOPIC BILATERAL SALPINGO OOPHERECTOMY (Bilateral)  Anesthesia type: General  Patient location: Mother/Baby  Post pain: Pain level controlled  Post assessment: Post-op Vital signs reviewed  Last Vitals:  Filed Vitals:   02/09/12 0548  BP: 122/71  Pulse: 72  Temp: 36.7 C  Resp: 18    Post vital signs: Reviewed  Level of consciousness: awake and alert   Complications: No apparent anesthesia complications

## 2012-02-10 NOTE — Discharge Summary (Signed)
Stacey Cross, Stacey Cross NO.:  [id number]  MEDICAL RECORD NO.:  [id number]  LOCATION:  9320                          FACILITY:  [facility]  PHYSICIAN:  Malva Limes, M.D.    DATE OF BIRTH:  1970-07-10  DATE OF ADMISSION:  02/08/2012 DATE OF DISCHARGE:  02/09/2012                              DISCHARGE SUMMARY   PRINCIPAL PROCEDURE:  Laparoscopic-assisted vaginal hysterectomy with bilateral salpingo-oophorectomy.  PRINCIPAL DISCHARGE DIAGNOSES: 1. Stage II breast carcinoma. 2. Positive BRCA status. 3. Uterine prolapse.  HISTORY OF PRESENT ILLNESS:  [name] is a 41 year old white female, G3, P3, who was admitted to [facility] on February 08, 2012, for a laparoscopic vaginal hysterectomy with bilateral salpingo-oophorectomy. The patient was diagnosed with stage II breast cancer in 2003.  During her workup, she was found to be BRCA positive.  Her oncologist suggested that she undergo removal of her ovaries.  The patient did also have first-degree prolapse and elected to have a vaginal hysterectomy also.  HOSPITAL COURSE:  The patient was admitted.  She underwent a laparoscopic-assisted vaginal hysterectomy with bilateral salpingo- oophorectomy without difficulties.  The patient was ready for discharge on postoperative day #1.  At that time, she was ambulating without difficulty.  She was eating a regular diet.  She had adequate pain control.  Her postop hemoglobin was 13.1.  The patient denied any vaginal bleeding.  The patient was instructed to follow up in the office in 3 weeks.  She was told to call the office with any vaginal bleeding, severe pain, or fever.  The patient did have flatus prior to discharge, but did not have a bowel movement.  She was instructed to use Colace and Dulcolax.  The patient's pathology is pending at this time.          ______________________________ Malva Limes, M.D.     MA/MEDQ  D:  02/09/2012  T:  02/10/2012  Job:   [id number]

## 2012-02-26 NOTE — Progress Notes (Signed)
Post discharge review completed for dates of service on 02/08/12-02/09/12.

## 2012-03-21 ENCOUNTER — Other Ambulatory Visit (HOSPITAL_BASED_OUTPATIENT_CLINIC_OR_DEPARTMENT_OTHER): Payer: BC Managed Care – PPO | Admitting: Lab

## 2012-03-21 ENCOUNTER — Ambulatory Visit (HOSPITAL_BASED_OUTPATIENT_CLINIC_OR_DEPARTMENT_OTHER): Payer: BC Managed Care – PPO | Admitting: Oncology

## 2012-03-21 ENCOUNTER — Encounter: Payer: Self-pay | Admitting: Oncology

## 2012-03-21 ENCOUNTER — Telehealth: Payer: Self-pay | Admitting: Oncology

## 2012-03-21 VITALS — BP 119/81 | HR 72 | Temp 98.3°F | Resp 20 | Ht 65.0 in | Wt 147.6 lb

## 2012-03-21 DIAGNOSIS — Z171 Estrogen receptor negative status [ER-]: Secondary | ICD-10-CM

## 2012-03-21 DIAGNOSIS — Z9010 Acquired absence of unspecified breast and nipple: Secondary | ICD-10-CM

## 2012-03-21 DIAGNOSIS — Z1501 Genetic susceptibility to malignant neoplasm of breast: Secondary | ICD-10-CM

## 2012-03-21 DIAGNOSIS — C50419 Malignant neoplasm of upper-outer quadrant of unspecified female breast: Secondary | ICD-10-CM

## 2012-03-21 LAB — CBC WITH DIFFERENTIAL/PLATELET
Basophils Absolute: 0 10*3/uL (ref 0.0–0.1)
Eosinophils Absolute: 0.1 10*3/uL (ref 0.0–0.5)
HCT: 39.9 % (ref 34.8–46.6)
HGB: 14 g/dL (ref 11.6–15.9)
MCH: 31.7 pg (ref 25.1–34.0)
MONO#: 0.5 10*3/uL (ref 0.1–0.9)
NEUT#: 2.4 10*3/uL (ref 1.5–6.5)
NEUT%: 57.3 % (ref 38.4–76.8)
RDW: 12.5 % (ref 11.2–14.5)
WBC: 4.1 10*3/uL (ref 3.9–10.3)
lymph#: 1.2 10*3/uL (ref 0.9–3.3)

## 2012-03-21 LAB — COMPREHENSIVE METABOLIC PANEL (CC13)
Albumin: 4.2 g/dL (ref 3.5–5.0)
BUN: 14 mg/dL (ref 7.0–26.0)
CO2: 27 mEq/L (ref 22–29)
Calcium: 9.4 mg/dL (ref 8.4–10.4)
Chloride: 100 mEq/L (ref 98–107)
Creatinine: 0.7 mg/dL (ref 0.6–1.1)
Glucose: 83 mg/dl (ref 70–99)
Potassium: 4 mEq/L (ref 3.5–5.1)

## 2012-03-21 NOTE — Progress Notes (Signed)
OFFICE PROGRESS NOTE  CC  Stacey Blamer, MD [facility] 1 [address] [place] [address] Dr. Chipper Herb Dr. Cyndia Bent  DIAGNOSIS: 41 year old with stage IIA (T2NxMx) invasive ductal carcinoma, triple negative,  With ki-67 80% measuring 3.5 x 1.0 cm diagnosed March 2013  PRIOR THERAPY:  1. Seen at [facility] with self palpated breast mass, needle core biopsy positive for IDC, triple negative, stage IIA  2. Genetic testing revealed BRCA I mutation   3. S/P port placement with complication of pneumothorax  4. S/P Neoadjuvant chemotherapy with Adriamycin/cytoxan given dose dense between 07/13/11 - 08/24/11   5. Neoadjuvant carbo/taxol begin 09/06/12 - 11/24/11 X 12 weeks completed   #6 patient has now gone bilateral mastectomies on 12/21/2011. Pathology revealed no evidence of residual cancer in the left breast. Sentinel nodes were negative for metastatic disease. Right breast without any evidence of malignancy. Patient has undergone immediate reconstruction of the breasts bilaterally.  #7 patient is now status post bilateral salpingo-oophorectomy and hysterectomy.  CURRENT THERAPY:  Observation INTERVAL HISTORY: Stacey Cross 41 y.o. female returns for follow up visit. Postoperatively she is doing well Her surgery was uneventful.. Denies Fevers chills night sweats shortness of breath chest pains. Remaider of the 10 point review of systems is negative.  MEDICAL HISTORY: Past Medical History  Diagnosis Date  . BRCA1 positive 08/17/2011  . Fatigue 09/28/2011  . Chemotherapy induced thrombocytopenia 10/12/2011  . Pneumothorax, postprocedural 06/2011    after [facility] cath   . Shortness of breath 06/2011    "related to the collapsed lung S/P PAC placement"  . History of blood transfusion ?10/2011    "during chemo treatments"  . Breast cancer   . Portacath in place     not flushed-will not be using for surgery per pt request    ALLERGIES:  is  allergic to adhesive.  MEDICATIONS:  Current Outpatient Prescriptions  Medication Sig Dispense Refill  . b complex vitamins tablet Take 1 tablet by mouth daily.      Marland Kitchen CALCIUM & MAGNESIUM CARBONATES PO Take 3 tablets by mouth every morning.      . Probiotic Product (PROBIOTIC PO) Take 1 capsule by mouth daily.        SURGICAL HISTORY:  Past Surgical History  Procedure Date  . Cesarean section 2003  . Portacath placement 06/23/2011    Procedure: INSERTION PORT-A-CATH;  Surgeon: [name], MD;  Location: [facility];  Service: General;  Laterality: N/A;  carm   . Mastectomy complete / simple w/ sentinel node biopsy 12/21/2011    left  . Mastectomy, radical 12/21/2011    right  . Tissue expander placement 12/21/2011    bilateral breast area  . Dilation and curettage of uterus 2002    "as a result of miscarriage"  . Pyloric stenosis     "@ age 89 week"  . Breast biopsy 05/2011    left  . Tissue expander placement 12/21/2011    Procedure: TISSUE EXPANDER;  Surgeon: Etter Sjogren, MD;  Location: [facility] OR;  Service: Plastics;  Laterality: Bilateral;  PLACEMENT OF BILATERAL TISSUE EXPANDERS   . Laparoscopic assisted vaginal hysterectomy 02/08/2012    Procedure: LAPAROSCOPIC ASSISTED VAGINAL HYSTERECTOMY;  Surgeon: Levi Aland, MD;  Location: [facility]S;  Service: Gynecology;  Laterality: N/A;    REVIEW OF SYSTEMS:  Pertinent items are noted in HPI.   PHYSICAL EXAMINATION: General appearance: alert, cooperative, appears stated age, fatigued, flushed and slowed  mentation Neck: no adenopathy, no carotid bruit, no JVD, supple, symmetrical, trachea midline and thyroid not enlarged, symmetric, no tenderness/mass/nodules Lymph nodes: Cervical, supraclavicular, and axillary nodes normal. Resp: clear to auscultation bilaterally and normal percussion bilaterally Back: symmetric, no curvature. ROM normal. No CVA tenderness. Cardio: regular rate and rhythm, S1, S2 normal, no murmur,  click, rub or gallop GI: soft, non-tender; bowel sounds normal; no masses,  no organomegaly Extremities: extremities normal, atraumatic, no cyanosis or edema Neurologic: Alert and oriented X 3, normal strength and tone. Normal symmetric reflexes. Normal coordination and gait  ECOG PERFORMANCE STATUS: 1 - Symptomatic but completely ambulatory  Blood pressure 119/81, pulse 72, temperature 98.3 F (36.8 C), temperature source Oral, resp. rate 20, height 5\' 5"  (1.651 m), weight 147 lb 9.6 oz (66.951 kg).  LABORATORY DATA: Lab Results  Component Value Date   WBC 4.1 03/21/2012   HGB 14.0 03/21/2012   HCT 39.9 03/21/2012   MCV 90.3 03/21/2012   PLT 149 03/21/2012      Chemistry      Component Value Date/Time   NA 142 03/21/2012 1146   NA 141 11/24/2011 0850   K 4.0 03/21/2012 1146   K 3.6 11/24/2011 0850   CL 100 03/21/2012 1146   CL 105 11/24/2011 0850   CO2 27 03/21/2012 1146   CO2 26 11/24/2011 0850   BUN 14.0 03/21/2012 1146   BUN 14 11/24/2011 0850   CREATININE 0.7 03/21/2012 1146   CREATININE 0.54 11/24/2011 0850      Component Value Date/Time   CALCIUM 9.4 03/21/2012 1146   CALCIUM 9.0 11/24/2011 0850   ALKPHOS 88 03/21/2012 1146   ALKPHOS 54 11/24/2011 0850   AST 28 03/21/2012 1146   AST 14 11/24/2011 0850   ALT 30 03/21/2012 1146   ALT 24 11/24/2011 0850   BILITOT 0.75 03/21/2012 1146   BILITOT 0.3 11/24/2011 0850    FINAL DIAGNOSIS Diagnosis 1. Lymph node, sentinel, biopsy, Left axillary #1 - THERE IS NO EVIDENCE OF CARCINOMA IN 1 OF 1 LYMPH NODE (0/1). 2. Lymph node, sentinel, biopsy, Left axillary #2 - THERE IS NO EVIDENCE OF CARCINOMA IN 1 OF 1 LYMPH NODE (0/1). 3. Lymph node, sentinel, biopsy, Left axillary #3 - THERE IS NO EVIDENCE OF CARCINOMA IN 1 OF 1 LYMPH NODE (0/1). 4. Breast, simple mastectomy, right - BENIGN BREAST PARENCHYMA. - THERE IS NO EVIDENCE OF MALIGNANCY. 5. Breast, simple mastectomy, Left - CHANGES CONSISTENT WITH TREATMENT EFFECT. - HEALING BIOPSY SITE. - THERE IS  NO EVIDENCE OF MALIGNANCY. - SEE COMMENT. Microscopic Comment 5. Grossly, there is a 2.2 cm area of indurated fibrous-like tissue containing a metallic clip. This area has been entirely submitted for histologic evaluation, revealing changes consistent with chemotherapy effect. There is no evidence of malignancy. Thus, the stage is ypT0. (JBK:eps 12/26/11) Pecola Leisure MD Pathologist, Electronic Signature (Case signed 12/26/2011) Intraoperative Diag   RADIOGRAPHIC STUDIES:  ASSESSMENT: 41 year old female with  1. Stage II invasive ductal carcinoma s/p bilateral mastectomies and immediate reconstruction. Final pathology revealed no evidence of residual cancer. All lymph nodes were negative for metastatic disease.  2. Patient is a BRCA carrier and she is status post bilateral salpingo-oophorectomy and hysterectomy tolerated the procedure well.  #3 on December 17 patient will have her implants placed.  PLAN:  1. I will see the patient back in 4 months.   #2 we will plan on getting restaging scans on her prior to her next visit.  All questions were answered. The  patient knows to call the clinic with any problems, questions or concerns. We can certainly see the patient much sooner if necessary.  I spent 40 minutes counseling the patient face to face. The total time spent in the appointment was 30 minutes.    Drue Second, MD Medical/Oncology [facility] [phone] (beeper) [phone] (Office)  03/21/2012, 1:46 PM

## 2012-03-21 NOTE — Patient Instructions (Addendum)
Doing well  We will continue to see you every 4 months  CT scans prior to next visit

## 2012-03-21 NOTE — Telephone Encounter (Signed)
gve the pt her march 2014 appt calendar along with the oral contrast for the ct scan appt. Pt is aware that she will be called with the scan appts.

## 2012-03-25 ENCOUNTER — Telehealth: Payer: Self-pay | Admitting: Medical Oncology

## 2012-03-25 NOTE — Telephone Encounter (Signed)
Dr Maxcine Ham office requesting letter from Dr Welton Flakes regarding ok to remove [facility] during patient's surgery scheduled 04/02/12. Will review with MD.

## 2012-03-26 NOTE — Telephone Encounter (Signed)
Per MD, faxed ltr to Dr. Maxcine Ham office for ok to remove [facility] during patient's surgery scheduled with Dr. Derryl Harbor.

## 2012-04-02 ENCOUNTER — Other Ambulatory Visit: Payer: BC Managed Care – PPO | Admitting: Lab

## 2012-04-02 ENCOUNTER — Ambulatory Visit: Payer: BC Managed Care – PPO | Admitting: Oncology

## 2012-05-10 ENCOUNTER — Other Ambulatory Visit: Payer: Self-pay | Admitting: *Deleted

## 2012-05-10 DIAGNOSIS — C50419 Malignant neoplasm of upper-outer quadrant of unspecified female breast: Secondary | ICD-10-CM

## 2012-05-13 ENCOUNTER — Telehealth: Payer: Self-pay | Admitting: Oncology

## 2012-05-13 NOTE — Telephone Encounter (Signed)
S/w tiffany from centralized scheduling and she has added the ct head to the other ct appts on the same day.

## 2012-06-19 ENCOUNTER — Other Ambulatory Visit (HOSPITAL_BASED_OUTPATIENT_CLINIC_OR_DEPARTMENT_OTHER): Payer: BC Managed Care – PPO | Admitting: Lab

## 2012-06-19 DIAGNOSIS — Z1501 Genetic susceptibility to malignant neoplasm of breast: Secondary | ICD-10-CM

## 2012-06-19 DIAGNOSIS — C50419 Malignant neoplasm of upper-outer quadrant of unspecified female breast: Secondary | ICD-10-CM

## 2012-06-19 LAB — CBC WITH DIFFERENTIAL/PLATELET
BASO%: 0.2 % (ref 0.0–2.0)
Basophils Absolute: 0 10*3/uL (ref 0.0–0.1)
EOS%: 1 % (ref 0.0–7.0)
HCT: 39.5 % (ref 34.8–46.6)
HGB: 14 g/dL (ref 11.6–15.9)
MCH: 33.6 pg (ref 25.1–34.0)
MONO#: 0.4 10*3/uL (ref 0.1–0.9)
NEUT%: 60.3 % (ref 38.4–76.8)
RDW: 13.2 % (ref 11.2–14.5)
WBC: 4 10*3/uL (ref 3.9–10.3)
lymph#: 1.2 10*3/uL (ref 0.9–3.3)

## 2012-06-19 LAB — COMPREHENSIVE METABOLIC PANEL (CC13)
ALT: 20 U/L (ref 0–55)
AST: 25 U/L (ref 5–34)
Albumin: 4 g/dL (ref 3.5–5.0)
BUN: 10 mg/dL (ref 7.0–26.0)
CO2: 27 mEq/L (ref 22–29)
Calcium: 9.5 mg/dL (ref 8.4–10.4)
Chloride: 103 mEq/L (ref 98–107)
Creatinine: 0.8 mg/dL (ref 0.6–1.1)
Potassium: 4.3 mEq/L (ref 3.5–5.1)

## 2012-06-20 ENCOUNTER — Ambulatory Visit (HOSPITAL_COMMUNITY)
Admission: RE | Admit: 2012-06-20 | Discharge: 2012-06-20 | Disposition: A | Discharge status: Home / Self Care | Payer: BC Managed Care – PPO | Source: Ambulatory Visit | Attending: Oncology | Admitting: Oncology

## 2012-06-20 ENCOUNTER — Other Ambulatory Visit (HOSPITAL_COMMUNITY): Payer: BC Managed Care – PPO

## 2012-06-20 DIAGNOSIS — C50919 Malignant neoplasm of unspecified site of unspecified female breast: Secondary | ICD-10-CM | POA: Insufficient documentation

## 2012-06-20 DIAGNOSIS — R05 Cough: Secondary | ICD-10-CM | POA: Insufficient documentation

## 2012-06-20 DIAGNOSIS — C50419 Malignant neoplasm of upper-outer quadrant of unspecified female breast: Secondary | ICD-10-CM

## 2012-06-20 DIAGNOSIS — Z9010 Acquired absence of unspecified breast and nipple: Secondary | ICD-10-CM | POA: Insufficient documentation

## 2012-06-20 DIAGNOSIS — Z90710 Acquired absence of both cervix and uterus: Secondary | ICD-10-CM | POA: Insufficient documentation

## 2012-06-20 DIAGNOSIS — R6889 Other general symptoms and signs: Secondary | ICD-10-CM | POA: Insufficient documentation

## 2012-06-20 DIAGNOSIS — Z9221 Personal history of antineoplastic chemotherapy: Secondary | ICD-10-CM | POA: Insufficient documentation

## 2012-06-20 DIAGNOSIS — R059 Cough, unspecified: Secondary | ICD-10-CM | POA: Insufficient documentation

## 2012-06-20 DIAGNOSIS — Z1509 Genetic susceptibility to other malignant neoplasm: Secondary | ICD-10-CM

## 2012-06-20 DIAGNOSIS — Z1501 Genetic susceptibility to malignant neoplasm of breast: Secondary | ICD-10-CM | POA: Insufficient documentation

## 2012-06-20 MED ORDER — IOHEXOL 300 MG/ML  SOLN
100.0000 mL | Freq: Once | INTRAMUSCULAR | Status: AC | PRN
  Administered 2012-06-20: 100 mL via INTRAVENOUS

## 2012-06-24 ENCOUNTER — Encounter: Payer: Self-pay | Admitting: Oncology

## 2012-06-24 ENCOUNTER — Ambulatory Visit (HOSPITAL_BASED_OUTPATIENT_CLINIC_OR_DEPARTMENT_OTHER): Payer: BC Managed Care – PPO | Admitting: Oncology

## 2012-06-24 VITALS — BP 120/79 | HR 59 | Temp 98.5°F | Resp 20 | Ht 65.0 in | Wt 150.6 lb

## 2012-06-24 DIAGNOSIS — C50419 Malignant neoplasm of upper-outer quadrant of unspecified female breast: Secondary | ICD-10-CM

## 2012-06-24 DIAGNOSIS — Z1501 Genetic susceptibility to malignant neoplasm of breast: Secondary | ICD-10-CM

## 2012-06-24 DIAGNOSIS — Z1509 Genetic susceptibility to other malignant neoplasm: Secondary | ICD-10-CM

## 2012-06-24 NOTE — Patient Instructions (Addendum)
Doing well  I will see you back in 4 onths

## 2012-06-24 NOTE — Progress Notes (Signed)
OFFICE PROGRESS NOTE  CC  Stacey Blamer, MD [facility] 1 [address] [place] [address] Dr. Chipper Herb Dr. Cyndia Bent  DIAGNOSIS: 42 year old with stage IIA (T2NxMx) invasive ductal carcinoma, triple negative,  With ki-67 80% measuring 3.5 x 1.0 cm diagnosed March 2013  PRIOR THERAPY:  1. Seen at [facility] with self palpated breast mass, needle core biopsy positive for IDC, triple negative, stage IIA  2. Genetic testing revealed BRCA I mutation   3. S/P port placement with complication of pneumothorax  4. S/P Neoadjuvant chemotherapy with Adriamycin/cytoxan given dose dense between 07/13/11 - 08/24/11   5. Neoadjuvant carbo/taxol begin 09/06/12 - 11/24/11 X 12 weeks completed   #6 patient has now gone bilateral mastectomies on 12/21/2011. Pathology revealed no evidence of residual cancer in the left breast. Sentinel nodes were negative for metastatic disease. Right breast without any evidence of malignancy. Patient has undergone immediate reconstruction of the breasts bilaterally.  #7 patient is now status post bilateral salpingo-oophorectomy and hysterectomy.  CURRENT THERAPY:  Observation INTERVAL HISTORY: Stacey Cross 42 y.o. female returns for follow up visit. Overall patient is doing well. She is very pleased with her outcome with her reconstructive surgery. She is denying any fevers chills night sweats shortness of breath chest pains palpitations. She has no nausea or vomiting. She does complain of headaches occasionally. Remainder of the 10 point review of systems is negative. MEDICAL HISTORY: Past Medical History  Diagnosis Date  . BRCA1 positive 08/17/2011  . Fatigue 09/28/2011  . Chemotherapy induced thrombocytopenia 10/12/2011  . Pneumothorax, postprocedural 06/2011    after [facility] cath   . Shortness of breath 06/2011    "related to the collapsed lung S/P PAC placement"  . History of blood transfusion ?10/2011    "during chemo  treatments"  . Breast cancer   . Portacath in place     not flushed-will not be using for surgery per pt request    ALLERGIES:  is allergic to adhesive.  MEDICATIONS:  Current Outpatient Prescriptions  Medication Sig Dispense Refill  . b complex vitamins tablet Take 1 tablet by mouth daily.      Marland Kitchen CALCIUM & MAGNESIUM CARBONATES PO Take 3 tablets by mouth every morning.      . Probiotic Product (PROBIOTIC PO) Take 1 capsule by mouth daily.       No current facility-administered medications for this visit.    SURGICAL HISTORY:  Past Surgical History  Procedure Laterality Date  . Cesarean section  2003  . Portacath placement  06/23/2011    Procedure: INSERTION PORT-A-CATH;  Surgeon: [name], MD;  Location: [facility];  Service: General;  Laterality: N/A;  carm   . Mastectomy complete / simple w/ sentinel node biopsy  12/21/2011    left  . Mastectomy, radical  12/21/2011    right  . Tissue expander placement  12/21/2011    bilateral breast area  . Dilation and curettage of uterus  2002    "as a result of miscarriage"  . Pyloric stenosis      "@ age 51 week"  . Breast biopsy  05/2011    left  . Tissue expander placement  12/21/2011    Procedure: TISSUE EXPANDER;  Surgeon: Etter Sjogren, MD;  [facility];  Service: Plastics;  Laterality: Bilateral;  PLACEMENT OF BILATERAL TISSUE EXPANDERS   . Laparoscopic assisted vaginal hysterectomy  02/08/2012    Procedure: LAPAROSCOPIC ASSISTED VAGINAL HYSTERECTOMY;  Surgeon:  Levi Aland, MD;  Location: [facility];  Service: Gynecology;  Laterality: N/A;    REVIEW OF SYSTEMS:  Pertinent items are noted in HPI.   PHYSICAL EXAMINATION: General appearance: alert, cooperative, appears stated age, fatigued, flushed and slowed mentation Neck: no adenopathy, no carotid bruit, no JVD, supple, symmetrical, trachea midline and thyroid not enlarged, symmetric, no tenderness/mass/nodules Lymph nodes: Cervical, supraclavicular, and  axillary nodes normal. Resp: clear to auscultation bilaterally and normal percussion bilaterally Back: symmetric, no curvature. ROM normal. No CVA tenderness. Cardio: regular rate and rhythm, S1, S2 normal, no murmur, click, rub or gallop GI: soft, non-tender; bowel sounds normal; no masses,  no organomegaly Extremities: extremities normal, atraumatic, no cyanosis or edema Neurologic: Alert and oriented X 3, normal strength and tone. Normal symmetric reflexes. Normal coordination and gait  ECOG PERFORMANCE STATUS: 1 - Symptomatic but completely ambulatory  Blood pressure 120/79, pulse 59, temperature 98.5 F (36.9 C), temperature source Oral, resp. rate 20, height 5\' 5"  (1.651 m), weight 150 lb 9.6 oz (68.312 kg).  LABORATORY DATA: Lab Results  Component Value Date   WBC 4.0 06/19/2012   HGB 14.0 06/19/2012   HCT 39.5 06/19/2012   MCV 94.7 06/19/2012   PLT 146 06/19/2012      Chemistry      Component Value Date/Time   NA 140 06/19/2012 0813   NA 141 11/24/2011 0850   K 4.3 06/19/2012 0813   K 3.6 11/24/2011 0850   CL 103 06/19/2012 0813   CL 105 11/24/2011 0850   CO2 27 06/19/2012 0813   CO2 26 11/24/2011 0850   BUN 10.0 06/19/2012 0813   BUN 14 11/24/2011 0850   CREATININE 0.8 06/19/2012 0813   CREATININE 0.54 11/24/2011 0850      Component Value Date/Time   CALCIUM 9.5 06/19/2012 0813   CALCIUM 9.0 11/24/2011 0850   ALKPHOS 85 06/19/2012 0813   ALKPHOS 54 11/24/2011 0850   AST 25 06/19/2012 0813   AST 14 11/24/2011 0850   ALT 20 06/19/2012 0813   ALT 24 11/24/2011 0850   BILITOT 0.68 06/19/2012 0813   BILITOT 0.3 11/24/2011 0850    FINAL DIAGNOSIS Diagnosis 1. Lymph node, sentinel, biopsy, Left axillary #1 - THERE IS NO EVIDENCE OF CARCINOMA IN 1 OF 1 LYMPH NODE (0/1). 2. Lymph node, sentinel, biopsy, Left axillary #2 - THERE IS NO EVIDENCE OF CARCINOMA IN 1 OF 1 LYMPH NODE (0/1). 3. Lymph node, sentinel, biopsy, Left axillary #3 - THERE IS NO EVIDENCE OF CARCINOMA IN 1 OF 1 LYMPH NODE (0/1). 4. Breast,  simple mastectomy, right - BENIGN BREAST PARENCHYMA. - THERE IS NO EVIDENCE OF MALIGNANCY. 5. Breast, simple mastectomy, Left - CHANGES CONSISTENT WITH TREATMENT EFFECT. - HEALING BIOPSY SITE. - THERE IS NO EVIDENCE OF MALIGNANCY. - SEE COMMENT. Microscopic Comment 5. Grossly, there is a 2.2 cm area of indurated fibrous-like tissue containing a metallic clip. This area has been entirely submitted for histologic evaluation, revealing changes consistent with chemotherapy effect. There is no evidence of malignancy. Thus, the stage is ypT0. (JBK:eps 12/26/11) Pecola Leisure MD Pathologist, Electronic Signature (Case signed 12/26/2011) Intraoperative Diag   RADIOGRAPHIC STUDIES:  ASSESSMENT: 42 year old female with  1. Stage II invasive ductal carcinoma s/p bilateral mastectomies and immediate reconstruction. Final pathology revealed no evidence of residual cancer. All lymph nodes were negative for metastatic disease.clinically and radiographically patient is doing well she has no evidence of recurrent disease  2. Patient is a BRCA carrier and she is status post  bilateral salpingo-oophorectomy and hysterectomy tolerated the procedure well.   PLAN:  1. I will see the patient back in 4 months.   #2 we discussed survivorship implementation. She is very active exercising and eating healthy. She is very much involved in reach to recovery here at [place].    All questions were answered. The patient knows to call the clinic with any problems, questions or concerns. We can certainly see the patient much sooner if necessary.  I spent 40 minutes counseling the patient face to face. The total time spent in the appointment was 30 minutes.    Drue Second, MD Medical/Oncology [facility] [phone] (beeper) [phone] (Office)  06/24/2012, 3:07 PM

## 2012-06-27 ENCOUNTER — Telehealth: Payer: Self-pay | Admitting: Oncology

## 2012-06-27 NOTE — Telephone Encounter (Signed)
LVOM in re to appt 07/14 @ 10:15 w/Dr.Khan.  Calendar mailed

## 2012-07-02 ENCOUNTER — Encounter: Payer: Self-pay | Admitting: Dietician

## 2012-07-02 NOTE — Progress Notes (Signed)
Pt registered to attend [facility]'s Breast Cancer Nutrition Class on 07/02/12. However, pt was a no-show.

## 2012-08-18 IMAGING — CR DG CHEST 2V
2 series · 2 of 2 positions shown · non-contrast
Comparison: Chest x-ray of 07/07/2011

CLINICAL DATA: Pneumothorax, follow-up

CHEST - 2 VIEW

[w chest pa]
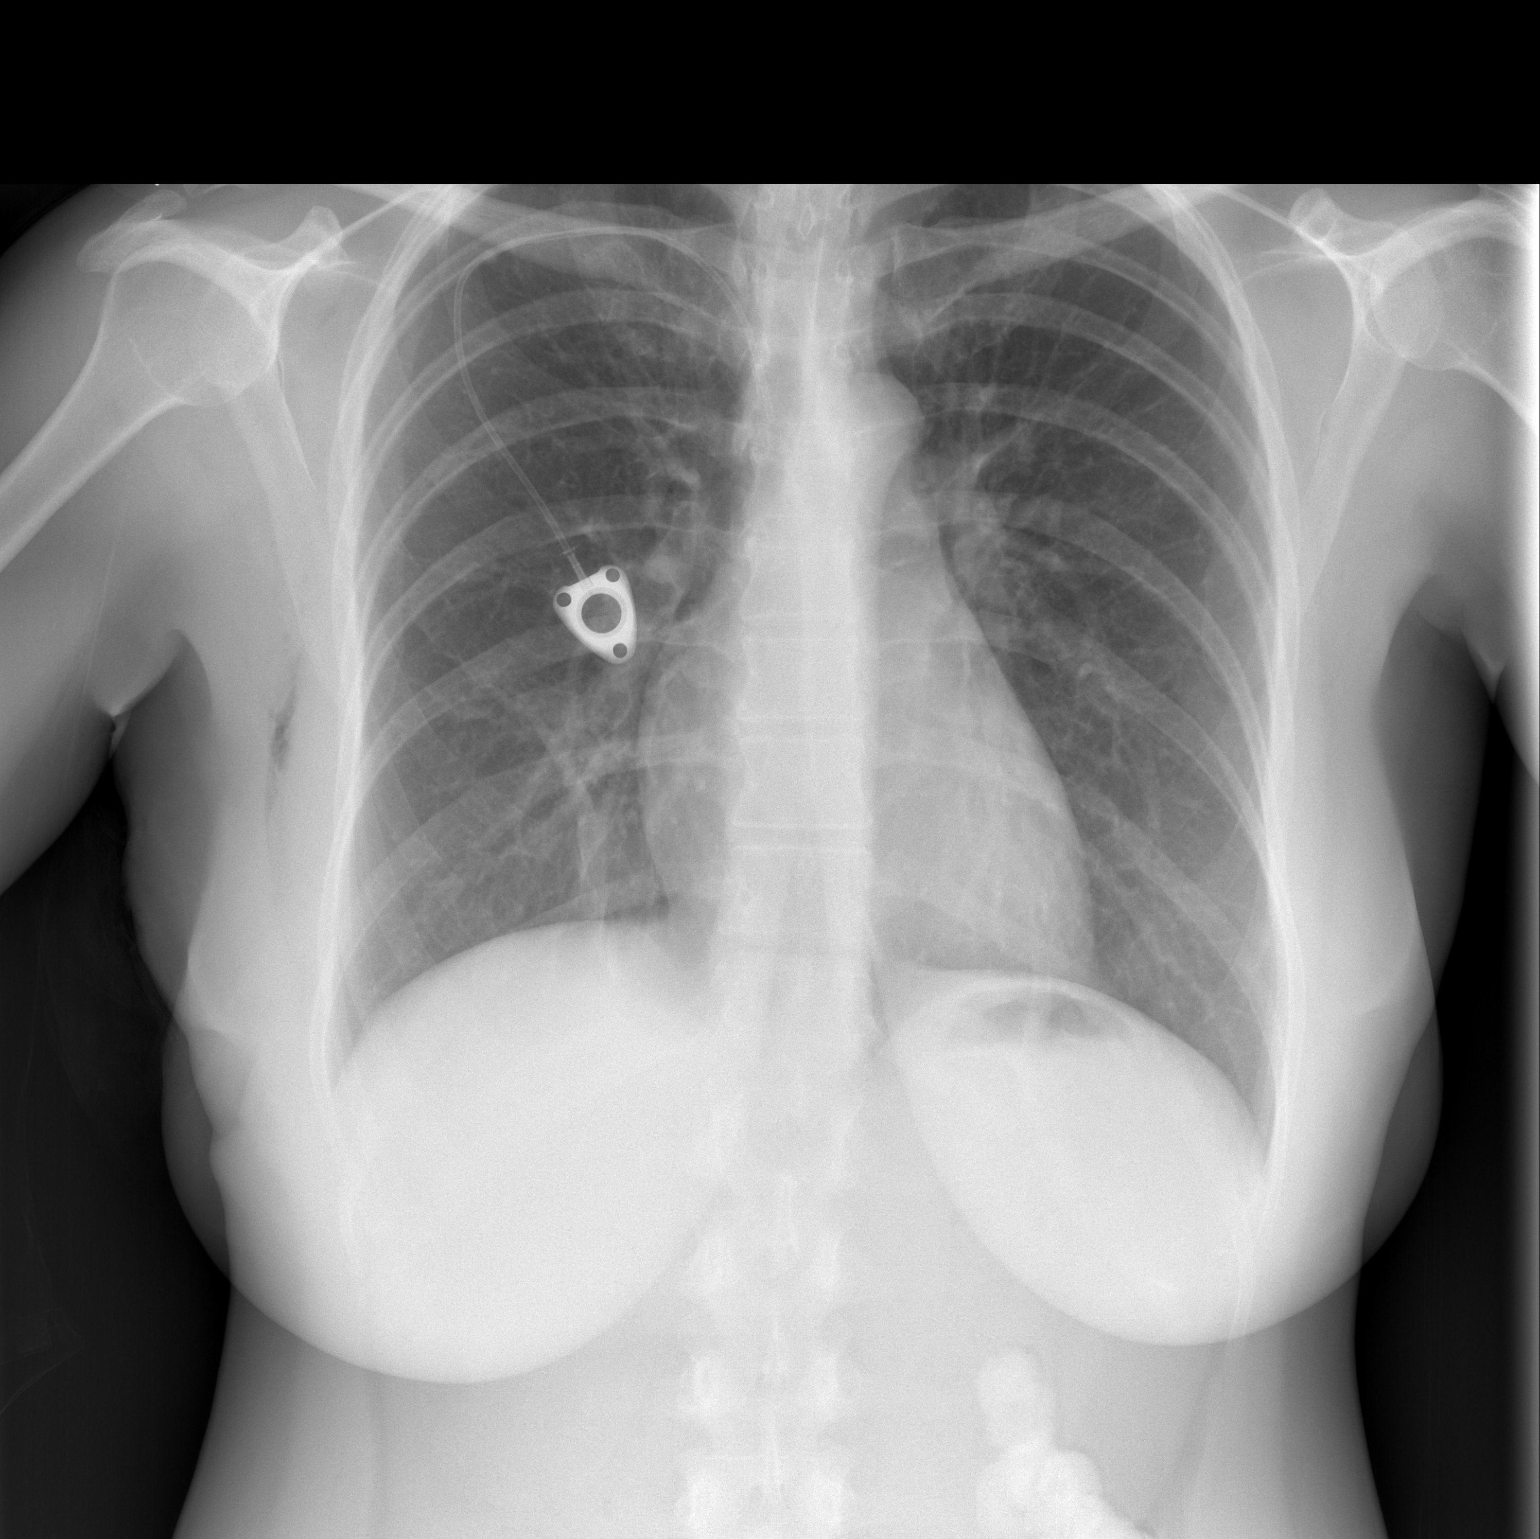

[w chest lat]
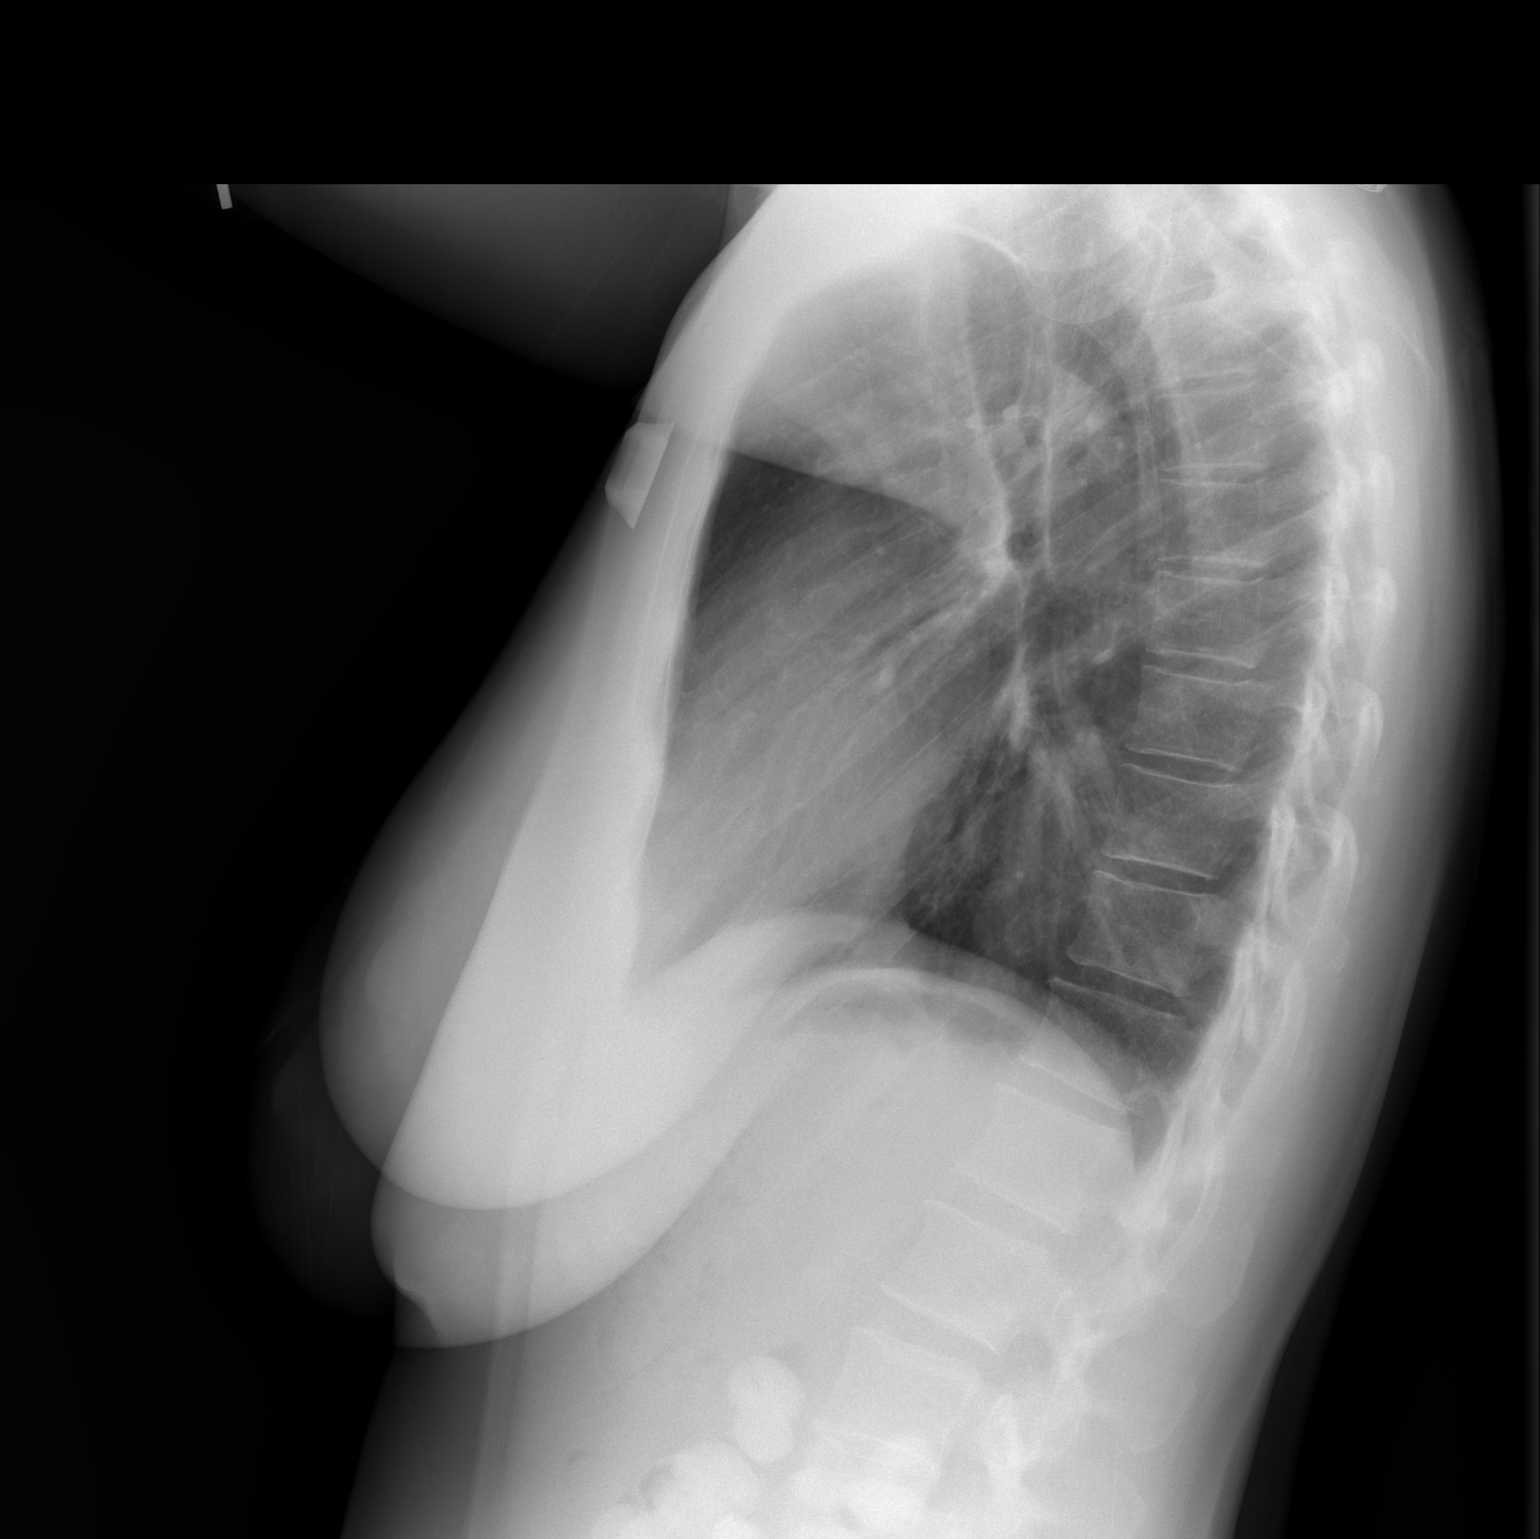

[2 of 2 positions shown; findings below may reference images not displayed]

FINDINGS: There is little change in the tiny right apical
pneumothorax.  Right chest wall subcutaneous air remains. Linear
atelectasis at the right lung base is stable.  No infiltrate or
effusion is seen.  Heart size is stable.  Right-sided Port-A-Cath
are unchanged with the tip in the mid upper SVC.
IMPRESSION: No change in very tiny right apical pneumothorax.

## 2012-08-18 IMAGING — CR DG CHEST 2V
2 series · 2 of 2 positions shown · non-contrast
Comparison: Plain film chest 07/06/2008 at [DATE] a.m.

CLINICAL DATA: Status post Port-A-Cath placement 1 week ago
resulting in right pneumothorax.  History of breast cancer.

CHEST - 2 VIEW

[w chest pa]
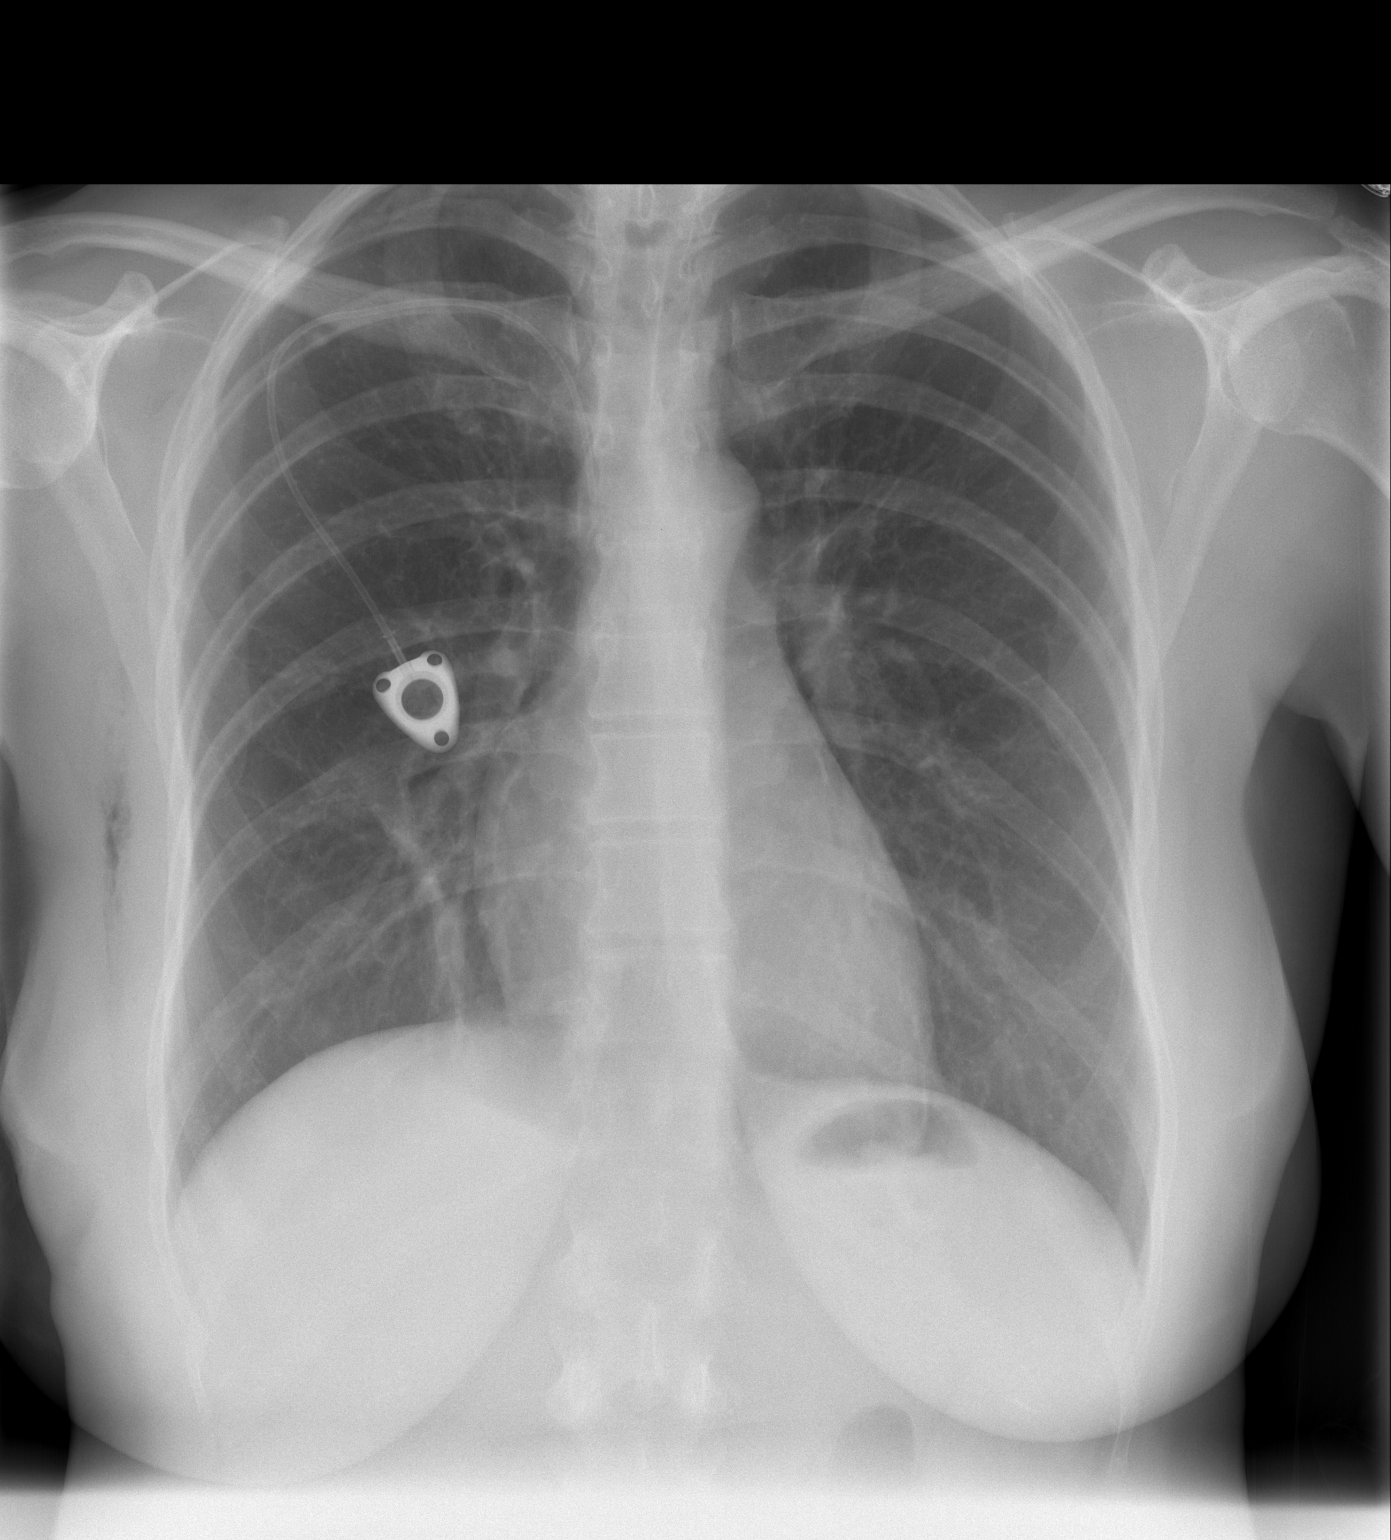

[w chest lat]
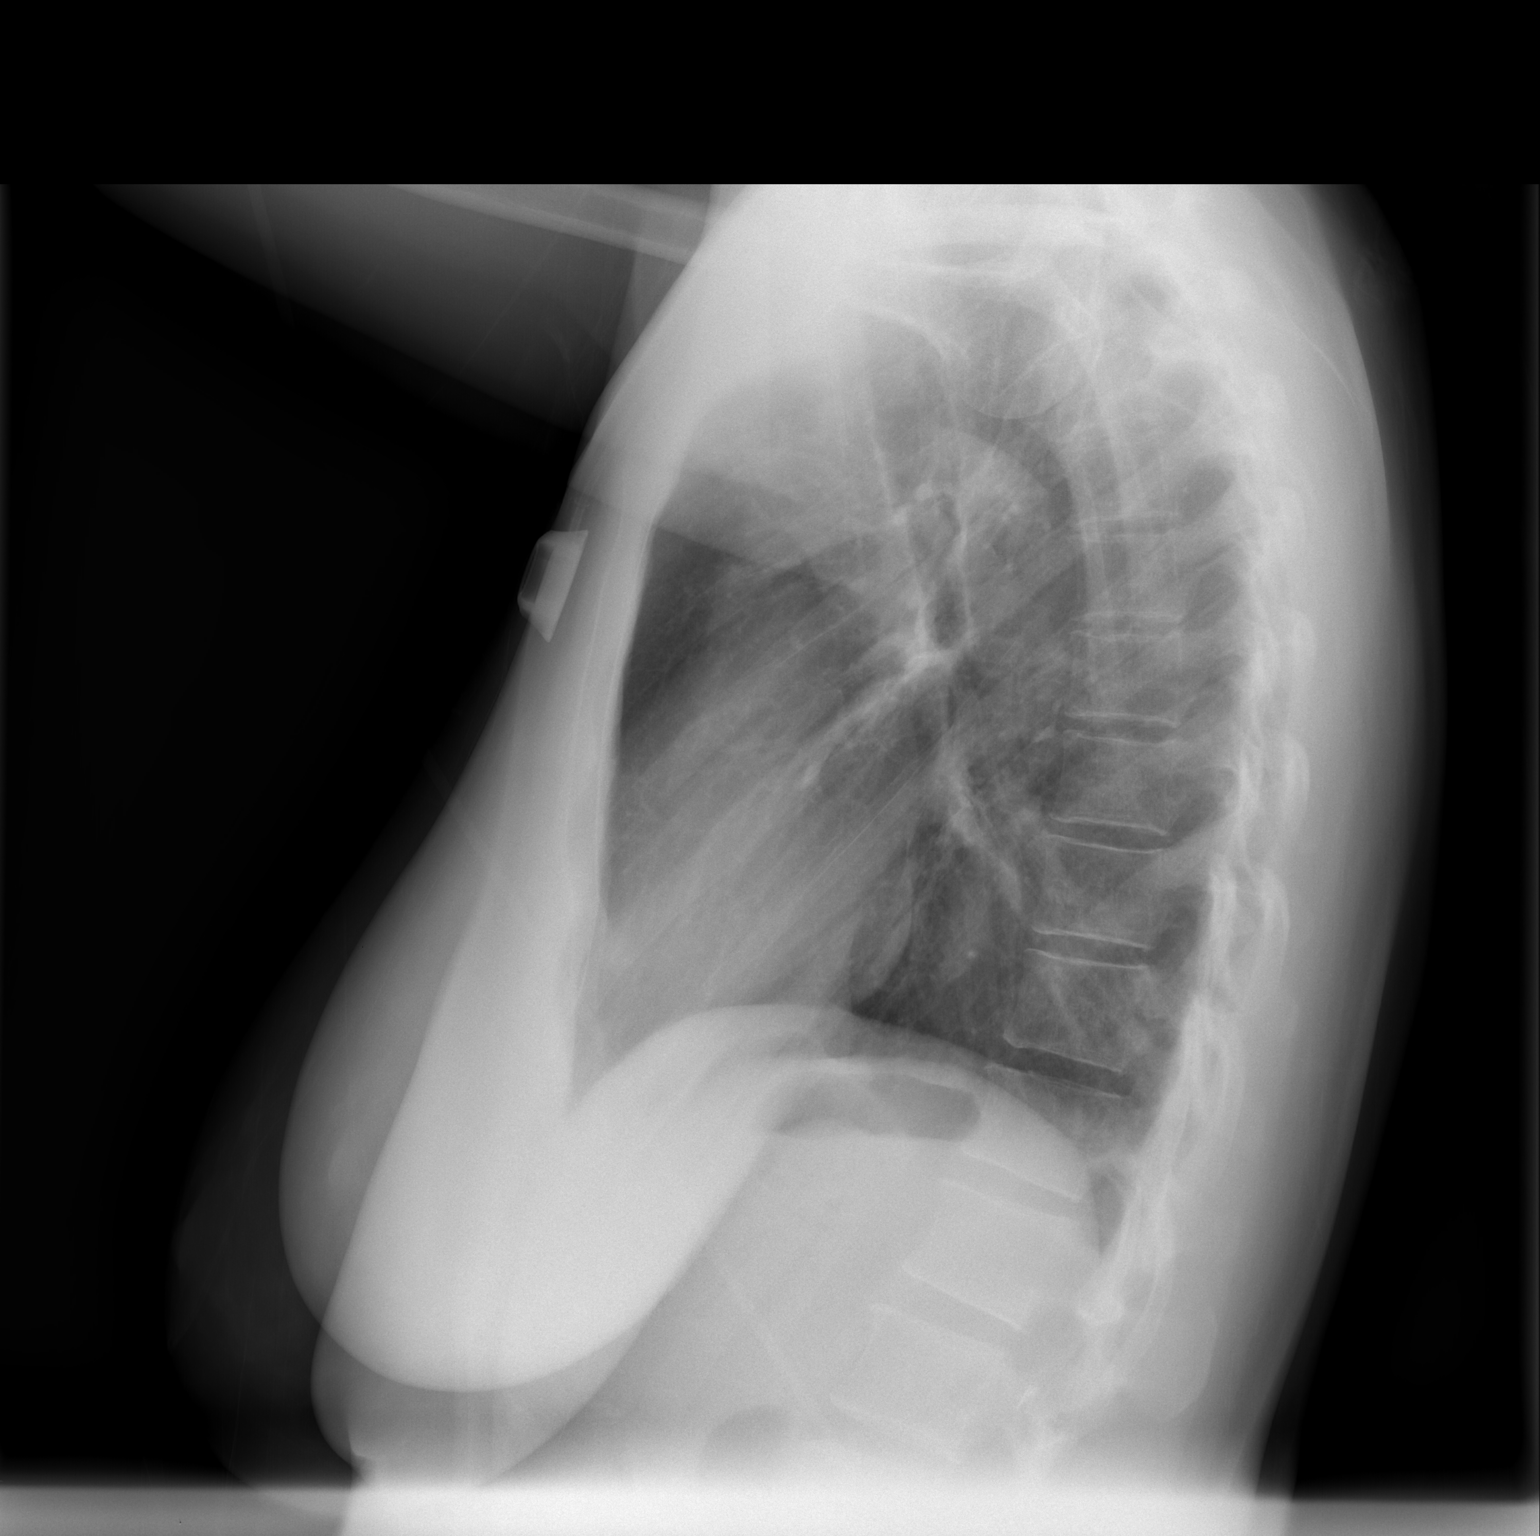

[2 of 2 positions shown; findings below may reference images not displayed]

FINDINGS: Right Port-A-Cath is identified and unchanged.  Right
chest tube has been removed.  The patient has a tiny residual right
apical pneumothorax estimated at 5% or less.  Left lung is clear.
No pleural effusion.  Heart size is normal.  Subcutaneous air along
the right chest wall noted.
IMPRESSION: Tiny residual right pneumothorax after chest tube removal.

## 2012-08-20 ENCOUNTER — Other Ambulatory Visit: Payer: Self-pay | Admitting: Obstetrics and Gynecology

## 2012-09-13 ENCOUNTER — Other Ambulatory Visit: Payer: Self-pay | Admitting: Emergency Medicine

## 2012-09-13 DIAGNOSIS — C50419 Malignant neoplasm of upper-outer quadrant of unspecified female breast: Secondary | ICD-10-CM

## 2012-09-16 ENCOUNTER — Other Ambulatory Visit: Payer: Self-pay | Admitting: Family Medicine

## 2012-09-16 ENCOUNTER — Ambulatory Visit
Admission: RE | Admit: 2012-09-16 | Discharge: 2012-09-16 | Disposition: A | Discharge status: Home / Self Care | Payer: BC Managed Care – PPO | Source: Ambulatory Visit | Attending: Family Medicine | Admitting: Family Medicine

## 2012-09-16 DIAGNOSIS — M545 Low back pain, unspecified: Secondary | ICD-10-CM

## 2012-09-17 ENCOUNTER — Other Ambulatory Visit (HOSPITAL_BASED_OUTPATIENT_CLINIC_OR_DEPARTMENT_OTHER): Payer: BC Managed Care – PPO | Admitting: Lab

## 2012-09-17 ENCOUNTER — Telehealth: Payer: Self-pay | Admitting: Medical Oncology

## 2012-09-17 DIAGNOSIS — C50419 Malignant neoplasm of upper-outer quadrant of unspecified female breast: Secondary | ICD-10-CM

## 2012-09-17 LAB — CBC WITH DIFFERENTIAL/PLATELET
BASO%: 0 % (ref 0.0–2.0)
EOS%: 1.8 % (ref 0.0–7.0)
HCT: 39.2 % (ref 34.8–46.6)
MCH: 33.6 pg (ref 25.1–34.0)
MCHC: 36.5 g/dL — ABNORMAL HIGH (ref 31.5–36.0)
MONO#: 0.4 10*3/uL (ref 0.1–0.9)
RBC: 4.25 10*6/uL (ref 3.70–5.45)
RDW: 12.1 % (ref 11.2–14.5)
WBC: 2.9 10*3/uL — ABNORMAL LOW (ref 3.9–10.3)
lymph#: 1.1 10*3/uL (ref 0.9–3.3)
nRBC: 0 % (ref 0–0)

## 2012-09-17 NOTE — Telephone Encounter (Signed)
Patient LVMOM with question re her lab results, ANC @ 1.3 and could this be related to her PCP putting her on naproxsyn d/t "sciatic, back pain." Patient also requesting to have lab results faxed to Dr Tiburcio Pea her PCP. Returned pt's call after review with MD, upon further conversation , patient also stated she's been running a low grade temp for the past week, 99.2, as well as a productive cough and states that her family is sick as well. Reviewed with patient neutropenic precautions and told patient to call her PCP regarding her symptoms. Per MD, informed patient as well that low ANC not r/t  Naproxsyn. Patient verbalized understanding and stated she will contact her PCP today d/t her symptoms.  Patients lab results faxed to Dr Tiburcio Pea per patient's request.

## 2012-10-28 ENCOUNTER — Ambulatory Visit (HOSPITAL_BASED_OUTPATIENT_CLINIC_OR_DEPARTMENT_OTHER): Payer: BC Managed Care – PPO | Admitting: Oncology

## 2012-10-28 ENCOUNTER — Telehealth: Payer: Self-pay | Admitting: *Deleted

## 2012-10-28 ENCOUNTER — Encounter: Payer: Self-pay | Admitting: Oncology

## 2012-10-28 ENCOUNTER — Other Ambulatory Visit (HOSPITAL_BASED_OUTPATIENT_CLINIC_OR_DEPARTMENT_OTHER): Payer: BC Managed Care – PPO | Admitting: Lab

## 2012-10-28 VITALS — BP 134/77 | HR 51 | Temp 97.9°F | Resp 20

## 2012-10-28 DIAGNOSIS — C50919 Malignant neoplasm of unspecified site of unspecified female breast: Secondary | ICD-10-CM

## 2012-10-28 DIAGNOSIS — Z1501 Genetic susceptibility to malignant neoplasm of breast: Secondary | ICD-10-CM

## 2012-10-28 DIAGNOSIS — Z1509 Genetic susceptibility to other malignant neoplasm: Secondary | ICD-10-CM

## 2012-10-28 DIAGNOSIS — C50412 Malignant neoplasm of upper-outer quadrant of left female breast: Secondary | ICD-10-CM

## 2012-10-28 DIAGNOSIS — C50419 Malignant neoplasm of upper-outer quadrant of unspecified female breast: Secondary | ICD-10-CM

## 2012-10-28 DIAGNOSIS — Z171 Estrogen receptor negative status [ER-]: Secondary | ICD-10-CM

## 2012-10-28 LAB — CBC WITH DIFFERENTIAL/PLATELET
Basophils Absolute: 0 10*3/uL (ref 0.0–0.1)
Eosinophils Absolute: 0.1 10*3/uL (ref 0.0–0.5)
HGB: 14.1 g/dL (ref 11.6–15.9)
MCV: 96.6 fL (ref 79.5–101.0)
MONO#: 0.3 10*3/uL (ref 0.1–0.9)
MONO%: 8.1 % (ref 0.0–14.0)
NEUT#: 2.1 10*3/uL (ref 1.5–6.5)
RBC: 4.11 10*6/uL (ref 3.70–5.45)
RDW: 12.6 % (ref 11.2–14.5)
WBC: 3.6 10*3/uL — ABNORMAL LOW (ref 3.9–10.3)
lymph#: 1.2 10*3/uL (ref 0.9–3.3)

## 2012-10-28 LAB — COMPREHENSIVE METABOLIC PANEL (CC13)
Albumin: 3.9 g/dL (ref 3.5–5.0)
Alkaline Phosphatase: 83 U/L (ref 40–150)
Calcium: 9.2 mg/dL (ref 8.4–10.4)
Chloride: 106 mEq/L (ref 98–109)
Glucose: 89 mg/dl (ref 70–140)
Potassium: 4.3 mEq/L (ref 3.5–5.1)
Sodium: 140 mEq/L (ref 136–145)
Total Protein: 6.9 g/dL (ref 6.4–8.3)

## 2012-10-28 NOTE — Patient Instructions (Addendum)
#  1 you are doing well. There is no evidence of recurrent disease.  #2 we will continue to see you every 6 months for followup.

## 2012-10-28 NOTE — Telephone Encounter (Signed)
APPTS MADE AND PRINTED...TD 

## 2012-11-20 NOTE — Progress Notes (Signed)
OFFICE PROGRESS NOTE  CC  [name], MD [facility] [address] [place] [address] Dr. Chipper Herb Dr. Cyndia Bent  DIAGNOSIS: 42 year old with stage IIA (T2NxMx) invasive ductal carcinoma, triple negative,  With ki-67 80% measuring 3.5 x 1.0 cm diagnosed March 2013  PRIOR THERAPY:  1. Seen at [facility] with self palpated breast mass, needle core biopsy positive for IDC, triple negative, stage IIA  2. Genetic testing revealed BRCA I mutation   3. S/P port placement with complication of pneumothorax  4. S/P Neoadjuvant chemotherapy with Adriamycin/cytoxan given dose dense between 07/13/11 - 08/24/11   5. Neoadjuvant carbo/taxol begin 09/06/12 - 11/24/11 X 12 weeks completed   #6 patient has now gone bilateral mastectomies on 12/21/2011. Pathology revealed no evidence of residual cancer in the left breast. Sentinel nodes were negative for metastatic disease. Right breast without any evidence of malignancy. Patient has undergone immediate reconstruction of the breasts bilaterally.  #7 patient is now status post bilateral salpingo-oophorectomy and hysterectomy.  CURRENT THERAPY:  Observation  INTERVAL HISTORY: Stacey Cross 42 y.o. female returns for follow up visit. Overall patient is doing well.. She is denying any fevers chills night sweats shortness of breath chest pains palpitations. She has no nausea or vomiting. She does complain of headaches occasionally. She is now running half marathons.Remainder of the 10 point review of systems is negative.  MEDICAL HISTORY: Past Medical History  Diagnosis Date  . BRCA1 positive 08/17/2011  . Fatigue 09/28/2011  . Chemotherapy induced thrombocytopenia 10/12/2011  . Pneumothorax, postprocedural 06/2011    after [facility] cath   . Shortness of breath 06/2011    "related to the collapsed lung S/P PAC placement"  . History of blood transfusion ?10/2011    "during chemo treatments"  . Breast cancer   .  Portacath in place     not flushed-will not be using for surgery per pt request    ALLERGIES:  is allergic to adhesive.  MEDICATIONS:  Current Outpatient Prescriptions  Medication Sig Dispense Refill  . b complex vitamins tablet Take 1 tablet by mouth as needed.       Marland Kitchen CALCIUM & MAGNESIUM CARBONATES PO Take 3 tablets by mouth every morning.      . naproxen (NAPROSYN) 250 MG tablet Take 250 mg by mouth 2 (two) times daily with a meal.      . Probiotic Product (PROBIOTIC PO) Take 1 capsule by mouth daily.       No current facility-administered medications for this visit.    SURGICAL HISTORY:  Past Surgical History  Procedure Laterality Date  . Cesarean section  2003  . Portacath placement  06/23/2011    Procedure: INSERTION PORT-A-CATH;  Surgeon: [name], MD;  Location: [facility];  Service: General;  Laterality: N/A;  carm   . Mastectomy complete / simple w/ sentinel node biopsy  12/21/2011    left  . Mastectomy, radical  12/21/2011    right  . Tissue expander placement  12/21/2011    bilateral breast area  . Dilation and curettage of uterus  2002    "as a result of miscarriage"  . Pyloric stenosis      "@ age 1 week"  . Breast biopsy  05/2011    left  . Tissue expander placement  12/21/2011    Procedure: TISSUE EXPANDER;  Surgeon: Etter Sjogren, MD;  Location: [facility];  Service: Plastics;  Laterality: Bilateral;  PLACEMENT OF BILATERAL  TISSUE EXPANDERS   . Laparoscopic assisted vaginal hysterectomy  02/08/2012    Procedure: LAPAROSCOPIC ASSISTED VAGINAL HYSTERECTOMY;  Surgeon: Levi Aland, MD;  Location: [facility];  Service: Gynecology;  Laterality: N/A;    REVIEW OF SYSTEMS:  Pertinent items are noted in HPI.   PHYSICAL EXAMINATION: General appearance: alert, cooperative, appears stated age, fatigued, flushed and slowed mentation Neck: no adenopathy, no carotid bruit, no JVD, supple, symmetrical, trachea midline and thyroid not enlarged, symmetric, no  tenderness/mass/nodules Lymph nodes: Cervical, supraclavicular, and axillary nodes normal. Resp: clear to auscultation bilaterally and normal percussion bilaterally Back: symmetric, no curvature. ROM normal. No CVA tenderness. Cardio: regular rate and rhythm, S1, S2 normal, no murmur, click, rub or gallop GI: soft, non-tender; bowel sounds normal; no masses,  no organomegaly Extremities: extremities normal, atraumatic, no cyanosis or edema Neurologic: Alert and oriented X 3, normal strength and tone. Normal symmetric reflexes. Normal coordination and gait  ECOG PERFORMANCE STATUS: 1 - Symptomatic but completely ambulatory  Blood pressure 134/77, pulse 51, temperature 97.9 F (36.6 C), temperature source Oral, resp. rate 20.  LABORATORY DATA: Lab Results  Component Value Date   WBC 3.6* 10/28/2012   HGB 14.1 10/28/2012   HCT 39.7 10/28/2012   MCV 96.6 10/28/2012   PLT 145 10/28/2012      Chemistry      Component Value Date/Time   NA 140 10/28/2012 1013   NA 141 11/24/2011 0850   K 4.3 10/28/2012 1013   K 3.6 11/24/2011 0850   CL 103 06/19/2012 0813   CL 105 11/24/2011 0850   CO2 26 10/28/2012 1013   CO2 26 11/24/2011 0850   BUN 8.9 10/28/2012 1013   BUN 14 11/24/2011 0850   CREATININE 0.7 10/28/2012 1013   CREATININE 0.54 11/24/2011 0850      Component Value Date/Time   CALCIUM 9.2 10/28/2012 1013   CALCIUM 9.0 11/24/2011 0850   ALKPHOS 83 10/28/2012 1013   ALKPHOS 54 11/24/2011 0850   AST 21 10/28/2012 1013   AST 14 11/24/2011 0850   ALT 23 10/28/2012 1013   ALT 24 11/24/2011 0850   BILITOT 1.10 10/28/2012 1013   BILITOT 0.3 11/24/2011 0850    FINAL DIAGNOSIS Diagnosis 1. Lymph node, sentinel, biopsy, Left axillary #1 - THERE IS NO EVIDENCE OF CARCINOMA IN 1 OF 1 LYMPH NODE (0/1). 2. Lymph node, sentinel, biopsy, Left axillary #2 - THERE IS NO EVIDENCE OF CARCINOMA IN 1 OF 1 LYMPH NODE (0/1). 3. Lymph node, sentinel, biopsy, Left axillary #3 - THERE IS NO EVIDENCE OF CARCINOMA IN 1 OF 1 LYMPH  NODE (0/1). 4. Breast, simple mastectomy, right - BENIGN BREAST PARENCHYMA. - THERE IS NO EVIDENCE OF MALIGNANCY. 5. Breast, simple mastectomy, Left - CHANGES CONSISTENT WITH TREATMENT EFFECT. - HEALING BIOPSY SITE. - THERE IS NO EVIDENCE OF MALIGNANCY. - SEE COMMENT. Microscopic Comment 5. Grossly, there is a 2.2 cm area of indurated fibrous-like tissue containing a metallic clip. This area has been entirely submitted for histologic evaluation, revealing changes consistent with chemotherapy effect. There is no evidence of malignancy. Thus, the stage is ypT0. (JBK:eps 12/26/11) Pecola Leisure MD Pathologist, Electronic Signature (Case signed 12/26/2011) Intraoperative Diag   RADIOGRAPHIC STUDIES:  ASSESSMENT: 42 year old female with  1. Stage II invasive ductal carcinoma s/p bilateral mastectomies and immediate reconstruction. Final pathology revealed no evidence of residual cancer. All lymph nodes were negative for metastatic disease.clinically and radiographically patient is doing well she has no evidence of recurrent disease  2. Patient  is a BRCA carrier and she is status post bilateral salpingo-oophorectomy and hysterectomy tolerated the procedure well.   PLAN:  1. I will see the patient back in 4 months.   #2 we discussed survivorship implementation. She is very active exercising and eating healthy. She did run her first marathon and did extremely well with a metal.    All questions were answered. The patient knows to call the clinic with any problems, questions or concerns. We can certainly see the patient much sooner if necessary.  I spent 40 minutes counseling the patient face to face. The total time spent in the appointment was 30 minutes.    Drue Second, MD Medical/Oncology [facility] [phone] (beeper) [phone] (Office)

## 2013-04-03 ENCOUNTER — Telehealth: Payer: Self-pay | Admitting: *Deleted

## 2013-04-03 NOTE — Telephone Encounter (Signed)
Lm informing the pt that kk will be on call 05/08/13. gv appt for 05/08/13 w/labs@ 1pm and ov@ 1:30p. Pt is aware that i will mall a letter/avs...td

## 2013-05-08 ENCOUNTER — Other Ambulatory Visit (HOSPITAL_BASED_OUTPATIENT_CLINIC_OR_DEPARTMENT_OTHER): Payer: BC Managed Care – PPO

## 2013-05-08 ENCOUNTER — Ambulatory Visit (HOSPITAL_BASED_OUTPATIENT_CLINIC_OR_DEPARTMENT_OTHER): Payer: BC Managed Care – PPO | Admitting: Oncology

## 2013-05-08 ENCOUNTER — Encounter (INDEPENDENT_AMBULATORY_CARE_PROVIDER_SITE_OTHER): Payer: Self-pay

## 2013-05-08 ENCOUNTER — Encounter: Payer: Self-pay | Admitting: Oncology

## 2013-05-08 ENCOUNTER — Other Ambulatory Visit: Payer: BC Managed Care – PPO

## 2013-05-08 ENCOUNTER — Telehealth: Payer: Self-pay | Admitting: Oncology

## 2013-05-08 ENCOUNTER — Ambulatory Visit: Payer: BC Managed Care – PPO | Admitting: Oncology

## 2013-05-08 VITALS — BP 136/87 | HR 65 | Temp 98.9°F | Resp 18 | Ht 65.0 in | Wt 147.2 lb

## 2013-05-08 DIAGNOSIS — Z1509 Genetic susceptibility to other malignant neoplasm: Secondary | ICD-10-CM

## 2013-05-08 DIAGNOSIS — M25552 Pain in left hip: Secondary | ICD-10-CM

## 2013-05-08 DIAGNOSIS — C50919 Malignant neoplasm of unspecified site of unspecified female breast: Secondary | ICD-10-CM

## 2013-05-08 DIAGNOSIS — C50419 Malignant neoplasm of upper-outer quadrant of unspecified female breast: Secondary | ICD-10-CM

## 2013-05-08 DIAGNOSIS — G589 Mononeuropathy, unspecified: Secondary | ICD-10-CM

## 2013-05-08 DIAGNOSIS — M25559 Pain in unspecified hip: Secondary | ICD-10-CM

## 2013-05-08 DIAGNOSIS — R5381 Other malaise: Secondary | ICD-10-CM

## 2013-05-08 DIAGNOSIS — R5383 Other fatigue: Secondary | ICD-10-CM

## 2013-05-08 DIAGNOSIS — M810 Age-related osteoporosis without current pathological fracture: Secondary | ICD-10-CM

## 2013-05-08 DIAGNOSIS — M25551 Pain in right hip: Secondary | ICD-10-CM

## 2013-05-08 DIAGNOSIS — Z1501 Genetic susceptibility to malignant neoplasm of breast: Secondary | ICD-10-CM

## 2013-05-08 DIAGNOSIS — Z170 Estrogen receptor positive status [ER+]: Secondary | ICD-10-CM

## 2013-05-08 DIAGNOSIS — M79609 Pain in unspecified limb: Secondary | ICD-10-CM

## 2013-05-08 LAB — CBC WITH DIFFERENTIAL/PLATELET
BASO%: 0.2 % (ref 0.0–2.0)
BASOS ABS: 0 10*3/uL (ref 0.0–0.1)
EOS%: 0.5 % (ref 0.0–7.0)
Eosinophils Absolute: 0 10*3/uL (ref 0.0–0.5)
HCT: 40.5 % (ref 34.8–46.6)
HGB: 14.6 g/dL (ref 11.6–15.9)
LYMPH%: 20 % (ref 14.0–49.7)
MCH: 33.4 pg (ref 25.1–34.0)
MCHC: 36 g/dL (ref 31.5–36.0)
MCV: 92.7 fL (ref 79.5–101.0)
MONO#: 0.3 10*3/uL (ref 0.1–0.9)
MONO%: 5.5 % (ref 0.0–14.0)
NEUT#: 4.1 10*3/uL (ref 1.5–6.5)
NEUT%: 73.8 % (ref 38.4–76.8)
PLATELETS: 137 10*3/uL — AB (ref 145–400)
RBC: 4.37 10*6/uL (ref 3.70–5.45)
RDW: 12 % (ref 11.2–14.5)
WBC: 5.5 10*3/uL (ref 3.9–10.3)
lymph#: 1.1 10*3/uL (ref 0.9–3.3)

## 2013-05-08 LAB — COMPREHENSIVE METABOLIC PANEL (CC13)
ALBUMIN: 4.6 g/dL (ref 3.5–5.0)
ALK PHOS: 76 U/L (ref 40–150)
ALT: 15 U/L (ref 0–55)
AST: 17 U/L (ref 5–34)
Anion Gap: 9 mEq/L (ref 3–11)
BILIRUBIN TOTAL: 0.82 mg/dL (ref 0.20–1.20)
BUN: 11.7 mg/dL (ref 7.0–26.0)
CO2: 29 mEq/L (ref 22–29)
Calcium: 9.5 mg/dL (ref 8.4–10.4)
Chloride: 102 mEq/L (ref 98–109)
Creatinine: 0.7 mg/dL (ref 0.6–1.1)
GLUCOSE: 92 mg/dL (ref 70–140)
POTASSIUM: 3.6 meq/L (ref 3.5–5.1)
SODIUM: 140 meq/L (ref 136–145)
Total Protein: 7.8 g/dL (ref 6.4–8.3)

## 2013-05-08 NOTE — Telephone Encounter (Signed)
, °

## 2013-05-13 NOTE — Progress Notes (Signed)
OFFICE PROGRESS NOTE  CC  [name], MD [address] [place] [address] Dr. Arloa Koh Dr. Neldon Mc  DIAGNOSIS: 43 year old with stage IIA (T2NxMx) invasive ductal carcinoma, triple negative,  With ki-67 80% measuring 3.5 x 1.0 cm diagnosed March 2013  PRIOR THERAPY:  1. Seen at [facility] with self palpated breast mass, needle core biopsy positive for IDC, triple negative, stage IIA  2. Genetic testing revealed BRCA I mutation   3. S/P port placement with complication of pneumothorax  4. S/P Neoadjuvant chemotherapy with Adriamycin/cytoxan given dose dense between 07/13/11 - 08/24/11   5. Neoadjuvant carbo/taxol begin 09/06/12 - 11/24/11 X 12 weeks completed   #6 s/p bilateral mastectomies on 12/21/2011. Pathology revealed no evidence of residual cancer in the left breast. Sentinel nodes were negative for metastatic disease. Right breast without any evidence of malignancy. Patient has undergone immediate reconstruction of the breasts bilaterally.  #7 patient is now status post bilateral salpingo-oophorectomy and hysterectomy.  CURRENT THERAPY:  Observation  INTERVAL HISTORY: Stacey Cross 43 y.o. female returns for follow up visit. Overall patient is doing well.. She is denying any fevers chills night sweats shortness of breath chest pains palpitations. She has no nausea or vomiting. She does complain of headaches occasionally. Since starting back running patient has noticed more pain the right hip and even lower back. Her exam is negative. She does not have neuropathy or weakness in her legs, no changes in bowel or bladder habits.Remainder of the 10 point review of systems is negative.  MEDICAL HISTORY: Past Medical History  Diagnosis Date  . BRCA1 positive 08/17/2011  . Fatigue 09/28/2011  . Chemotherapy induced thrombocytopenia 10/12/2011  . Pneumothorax, postprocedural 06/2011    after [facility] cath   . Shortness of breath 06/2011    "related to the  collapsed lung S/P PAC placement"  . History of blood transfusion ?10/2011    "during chemo treatments"  . Breast cancer   . Portacath in place     not flushed-will not be using for surgery per pt request    ALLERGIES:  is allergic to adhesive.  MEDICATIONS:  Current Outpatient Prescriptions  Medication Sig Dispense Refill  . b complex vitamins tablet Take 1 tablet by mouth as needed.       Marland Kitchen CALCIUM & MAGNESIUM CARBONATES PO Take 3 tablets by mouth every morning.      . naproxen (NAPROSYN) 250 MG tablet Take 250 mg by mouth 2 (two) times daily with a meal.      . Probiotic Product (PROBIOTIC PO) Take 1 capsule by mouth daily.       No current facility-administered medications for this visit.    SURGICAL HISTORY:  Past Surgical History  Procedure Laterality Date  . Cesarean section  2003  . Portacath placement  06/23/2011    Procedure: INSERTION PORT-A-CATH;  Surgeon: Haywood Lasso, MD;  Location: [place];  Service: General;  Laterality: N/A;  carm   . Mastectomy complete / simple w/ sentinel node biopsy  12/21/2011    left  . Mastectomy, radical  12/21/2011    right  . Tissue expander placement  12/21/2011    bilateral breast area  . Dilation and curettage of uterus  2002    "as a result of miscarriage"  . Pyloric stenosis      "@ age 42 week"  . Breast biopsy  05/2011    left  . Tissue expander placement  12/21/2011  Procedure: TISSUE EXPANDER;  Surgeon: Crissie Reese, MD;  Location: [place];  Service: Plastics;  Laterality: Bilateral;  PLACEMENT OF BILATERAL TISSUE EXPANDERS   . Laparoscopic assisted vaginal hysterectomy  02/08/2012    Procedure: LAPAROSCOPIC ASSISTED VAGINAL HYSTERECTOMY;  Surgeon: Olga Millers, MD;  Location: [facility];  Service: Gynecology;  Laterality: N/A;    REVIEW OF SYSTEMS:  Pertinent items are noted in HPI.   PHYSICAL EXAMINATION: General appearance: alert, cooperative, appears stated age, fatigued, flushed and slowed  mentation Neck: no adenopathy, no carotid bruit, no JVD, supple, symmetrical, trachea midline and thyroid not enlarged, symmetric, no tenderness/mass/nodules Lymph nodes: Cervical, supraclavicular, and axillary nodes normal. Resp: clear to auscultation bilaterally and normal percussion bilaterally Back: symmetric, no curvature. ROM normal. No CVA tenderness. Cardio: regular rate and rhythm, S1, S2 normal, no murmur, click, rub or gallop GI: soft, non-tender; bowel sounds normal; no masses,  no organomegaly Extremities: extremities normal, atraumatic, no cyanosis or edema Neurologic: Alert and oriented X 3, normal strength and tone. Normal symmetric reflexes. Normal coordination and gait  ECOG PERFORMANCE STATUS: 1 - Symptomatic but completely ambulatory  Blood pressure 136/87, pulse 65, temperature 98.9 F (37.2 C), temperature source Oral, resp. rate 18, height 5' 5"  (1.651 m), weight 147 lb 3.2 oz (66.769 kg).  LABORATORY DATA: Lab Results  Component Value Date   WBC 5.5 05/08/2013   HGB 14.6 05/08/2013   HCT 40.5 05/08/2013   MCV 92.7 05/08/2013   PLT 137* 05/08/2013      Chemistry      Component Value Date/Time   NA 140 05/08/2013 1257   NA 141 11/24/2011 0850   K 3.6 05/08/2013 1257   K 3.6 11/24/2011 0850   CL 103 06/19/2012 0813   CL 105 11/24/2011 0850   CO2 29 05/08/2013 1257   CO2 26 11/24/2011 0850   BUN 11.7 05/08/2013 1257   BUN 14 11/24/2011 0850   CREATININE 0.7 05/08/2013 1257   CREATININE 0.54 11/24/2011 0850      Component Value Date/Time   CALCIUM 9.5 05/08/2013 1257   CALCIUM 9.0 11/24/2011 0850   ALKPHOS 76 05/08/2013 1257   ALKPHOS 54 11/24/2011 0850   AST 17 05/08/2013 1257   AST 14 11/24/2011 0850   ALT 15 05/08/2013 1257   ALT 24 11/24/2011 0850   BILITOT 0.82 05/08/2013 1257   BILITOT 0.3 11/24/2011 0850    FINAL DIAGNOSIS Diagnosis 1. Lymph node, sentinel, biopsy, Left axillary #1 - THERE IS NO EVIDENCE OF CARCINOMA IN 1 OF 1 LYMPH NODE (0/1). 2. Lymph node, sentinel,  biopsy, Left axillary #2 - THERE IS NO EVIDENCE OF CARCINOMA IN 1 OF 1 LYMPH NODE (0/1). 3. Lymph node, sentinel, biopsy, Left axillary #3 - THERE IS NO EVIDENCE OF CARCINOMA IN 1 OF 1 LYMPH NODE (0/1). 4. Breast, simple mastectomy, right - BENIGN BREAST PARENCHYMA. - THERE IS NO EVIDENCE OF MALIGNANCY. 5. Breast, simple mastectomy, Left - CHANGES CONSISTENT WITH TREATMENT EFFECT. - HEALING BIOPSY SITE. - THERE IS NO EVIDENCE OF MALIGNANCY. - SEE COMMENT. Microscopic Comment 5. Grossly, there is a 2.2 cm area of indurated fibrous-like tissue containing a metallic clip. This area has been entirely submitted for histologic evaluation, revealing changes consistent with chemotherapy effect. There is no evidence of malignancy. Thus, the stage is ypT0. (JBK:eps 12/26/11) Enid Cutter MD Pathologist, Electronic Signature (Case signed 12/26/2011) Intraoperative Diag   RADIOGRAPHIC STUDIES:  ASSESSMENT/PLAN: 43 year old female with  1. Stage II invasive ductal carcinoma s/p bilateral mastectomies and  immediate reconstruction. Final pathology revealed no evidence of residual cancer. All lymph nodes were negative for metastatic disease.clinically and radiographically patient is doing well she has no evidence of recurrent disease  2. Patient is a BRCA carrier and she is status post bilateral salpingo-oophorectomy and hysterectomy tolerated the procedure well.  3. Right hip/leg pain: we will obtain a MRI of the pelvis.  4. Patient postmenopausal: will obtain a bone density scan  5. Patient will be seen back in 4 months for follow up.   All questions were answered. The patient knows to call the clinic with any problems, questions or concerns. We can certainly see the patient much sooner if necessary.  I spent 20 minutes counseling the patient face to face. The total time spent in the appointment was 30 minutes.    Marcy Panning, MD Medical/Oncology [facility] [phone]  (beeper) [phone] (Office)

## 2013-05-19 ENCOUNTER — Ambulatory Visit (HOSPITAL_COMMUNITY)
Admission: RE | Admit: 2013-05-19 | Discharge: 2013-05-19 | Disposition: A | Discharge status: Home / Self Care | Payer: BC Managed Care – PPO | Source: Ambulatory Visit | Attending: Oncology | Admitting: Oncology

## 2013-05-19 DIAGNOSIS — M545 Low back pain, unspecified: Secondary | ICD-10-CM | POA: Insufficient documentation

## 2013-05-19 DIAGNOSIS — M25552 Pain in left hip: Secondary | ICD-10-CM

## 2013-05-19 DIAGNOSIS — M25559 Pain in unspecified hip: Secondary | ICD-10-CM | POA: Insufficient documentation

## 2013-05-19 DIAGNOSIS — C50419 Malignant neoplasm of upper-outer quadrant of unspecified female breast: Secondary | ICD-10-CM

## 2013-05-19 DIAGNOSIS — M25551 Pain in right hip: Secondary | ICD-10-CM

## 2013-05-19 MED ORDER — GADOBENATE DIMEGLUMINE 529 MG/ML IV SOLN
13.0000 mL | Freq: Once | INTRAVENOUS | Status: AC | PRN
  Administered 2013-05-19: 13 mL via INTRAVENOUS

## 2013-05-20 ENCOUNTER — Telehealth: Payer: Self-pay | Admitting: *Deleted

## 2013-05-20 NOTE — Telephone Encounter (Signed)
Per Dr. Humphrey Rolls, the MRI of pelvis is normal. Left message on patient's cell phone. Instructed patient to call [phone] if she had any further questions.

## 2013-05-20 NOTE — Telephone Encounter (Signed)
Message copied by Hebert Soho on Tue May 20, 2013 10:28 AM ------      Message from: Deatra Robinson      Created: Tue May 20, 2013  9:30 AM       MRI pelvis normal ------

## 2013-05-22 ENCOUNTER — Ambulatory Visit
Admission: RE | Admit: 2013-05-22 | Discharge: 2013-05-22 | Disposition: A | Discharge status: Home / Self Care | Payer: BC Managed Care – PPO | Source: Ambulatory Visit | Attending: Oncology | Admitting: Oncology

## 2013-05-22 DIAGNOSIS — C50419 Malignant neoplasm of upper-outer quadrant of unspecified female breast: Secondary | ICD-10-CM

## 2013-05-22 DIAGNOSIS — M810 Age-related osteoporosis without current pathological fracture: Secondary | ICD-10-CM

## 2013-06-09 ENCOUNTER — Encounter: Payer: Self-pay | Admitting: Cardiology

## 2013-08-26 ENCOUNTER — Other Ambulatory Visit: Payer: Self-pay | Admitting: Obstetrics and Gynecology

## 2013-09-02 ENCOUNTER — Telehealth: Payer: Self-pay | Admitting: Physician Assistant

## 2013-09-02 NOTE — Telephone Encounter (Signed)
, °

## 2013-09-05 ENCOUNTER — Telehealth: Payer: Self-pay | Admitting: Physician Assistant

## 2013-09-05 NOTE — Telephone Encounter (Signed)
, °

## 2013-09-11 ENCOUNTER — Ambulatory Visit: Payer: BC Managed Care – PPO | Admitting: Oncology

## 2013-09-11 ENCOUNTER — Other Ambulatory Visit: Payer: BC Managed Care – PPO

## 2013-09-17 ENCOUNTER — Other Ambulatory Visit: Payer: BC Managed Care – PPO

## 2013-09-17 ENCOUNTER — Ambulatory Visit: Payer: BC Managed Care – PPO | Admitting: Oncology

## 2013-09-23 ENCOUNTER — Ambulatory Visit: Payer: BC Managed Care – PPO | Admitting: Physician Assistant

## 2013-09-23 ENCOUNTER — Other Ambulatory Visit: Payer: BC Managed Care – PPO

## 2013-09-24 ENCOUNTER — Other Ambulatory Visit: Payer: Self-pay | Admitting: *Deleted

## 2013-09-24 DIAGNOSIS — C50919 Malignant neoplasm of unspecified site of unspecified female breast: Secondary | ICD-10-CM

## 2013-09-25 ENCOUNTER — Other Ambulatory Visit (HOSPITAL_BASED_OUTPATIENT_CLINIC_OR_DEPARTMENT_OTHER): Payer: BC Managed Care – PPO

## 2013-09-25 ENCOUNTER — Encounter: Payer: BC Managed Care – PPO | Admitting: Physician Assistant

## 2013-09-25 ENCOUNTER — Ambulatory Visit (HOSPITAL_BASED_OUTPATIENT_CLINIC_OR_DEPARTMENT_OTHER): Payer: BC Managed Care – PPO | Admitting: Family

## 2013-09-25 ENCOUNTER — Ambulatory Visit (HOSPITAL_COMMUNITY)
Admission: RE | Admit: 2013-09-25 | Discharge: 2013-09-25 | Disposition: A | Discharge status: Home / Self Care | Payer: BC Managed Care – PPO | Source: Ambulatory Visit | Attending: Physician Assistant | Admitting: Physician Assistant

## 2013-09-25 ENCOUNTER — Other Ambulatory Visit (HOSPITAL_COMMUNITY): Payer: BC Managed Care – PPO

## 2013-09-25 VITALS — BP 120/64 | HR 55 | Temp 98.4°F | Resp 18 | Ht 65.0 in | Wt 146.6 lb

## 2013-09-25 DIAGNOSIS — C50419 Malignant neoplasm of upper-outer quadrant of unspecified female breast: Secondary | ICD-10-CM

## 2013-09-25 DIAGNOSIS — F3289 Other specified depressive episodes: Secondary | ICD-10-CM

## 2013-09-25 DIAGNOSIS — F329 Major depressive disorder, single episode, unspecified: Secondary | ICD-10-CM

## 2013-09-25 DIAGNOSIS — F411 Generalized anxiety disorder: Secondary | ICD-10-CM

## 2013-09-25 DIAGNOSIS — M25512 Pain in left shoulder: Secondary | ICD-10-CM

## 2013-09-25 DIAGNOSIS — M545 Low back pain, unspecified: Secondary | ICD-10-CM | POA: Insufficient documentation

## 2013-09-25 DIAGNOSIS — F32A Depression, unspecified: Secondary | ICD-10-CM

## 2013-09-25 DIAGNOSIS — F419 Anxiety disorder, unspecified: Secondary | ICD-10-CM

## 2013-09-25 DIAGNOSIS — Z170 Estrogen receptor positive status [ER+]: Secondary | ICD-10-CM

## 2013-09-25 DIAGNOSIS — M549 Dorsalgia, unspecified: Secondary | ICD-10-CM | POA: Insufficient documentation

## 2013-09-25 DIAGNOSIS — Z853 Personal history of malignant neoplasm of breast: Secondary | ICD-10-CM | POA: Insufficient documentation

## 2013-09-25 DIAGNOSIS — C50919 Malignant neoplasm of unspecified site of unspecified female breast: Secondary | ICD-10-CM

## 2013-09-25 LAB — CBC WITH DIFFERENTIAL/PLATELET
BASO%: 0.2 % (ref 0.0–2.0)
BASOS ABS: 0 10*3/uL (ref 0.0–0.1)
EOS%: 1 % (ref 0.0–7.0)
Eosinophils Absolute: 0 10*3/uL (ref 0.0–0.5)
HEMATOCRIT: 40.6 % (ref 34.8–46.6)
HGB: 14.9 g/dL (ref 11.6–15.9)
LYMPH%: 33.7 % (ref 14.0–49.7)
MCH: 33.9 pg (ref 25.1–34.0)
MCHC: 36.7 g/dL — ABNORMAL HIGH (ref 31.5–36.0)
MCV: 92.3 fL (ref 79.5–101.0)
MONO#: 0.3 10*3/uL (ref 0.1–0.9)
MONO%: 7.9 % (ref 0.0–14.0)
NEUT#: 2.4 10*3/uL (ref 1.5–6.5)
NEUT%: 57.2 % (ref 38.4–76.8)
Platelets: 145 10*3/uL (ref 145–400)
RBC: 4.4 10*6/uL (ref 3.70–5.45)
RDW: 11.8 % (ref 11.2–14.5)
WBC: 4.2 10*3/uL (ref 3.9–10.3)
lymph#: 1.4 10*3/uL (ref 0.9–3.3)

## 2013-09-25 LAB — COMPREHENSIVE METABOLIC PANEL (CC13)
ALT: 16 U/L (ref 0–55)
ANION GAP: 9 meq/L (ref 3–11)
AST: 19 U/L (ref 5–34)
Albumin: 4.4 g/dL (ref 3.5–5.0)
Alkaline Phosphatase: 82 U/L (ref 40–150)
BUN: 19 mg/dL (ref 7.0–26.0)
CALCIUM: 9.2 mg/dL (ref 8.4–10.4)
CHLORIDE: 103 meq/L (ref 98–109)
CO2: 27 mEq/L (ref 22–29)
CREATININE: 0.7 mg/dL (ref 0.6–1.1)
Glucose: 87 mg/dl (ref 70–140)
Potassium: 3.9 mEq/L (ref 3.5–5.1)
Sodium: 139 mEq/L (ref 136–145)
Total Bilirubin: 0.89 mg/dL (ref 0.20–1.20)
Total Protein: 7.5 g/dL (ref 6.4–8.3)

## 2013-09-25 MED ORDER — ESCITALOPRAM OXALATE 10 MG PO TABS: 10.0000 mg | ORAL_TABLET | Freq: Every day | ORAL | Status: DC

## 2013-09-26 ENCOUNTER — Other Ambulatory Visit: Payer: Self-pay | Admitting: Emergency Medicine

## 2013-09-26 MED ORDER — ESCITALOPRAM OXALATE 10 MG PO TABS: 10.0000 mg | ORAL_TABLET | Freq: Every day | ORAL | Status: DC

## 2013-09-26 NOTE — Patient Instructions (Signed)
Discussed patients labs and previous DEXA scan and MRI. The patient will start taking Lexapro for anxiety and depression and discussed side effects and treatment regimen. The patient also discussed with and seen by Dr. Jana Hakim. She was instructed to apply for insurance with her husbands new company as soon as possible and to find a PCP and breast oncologist once she moves. She was also instructed that her next DEXA scan would be due in February 2017. She was instructed to call the clinic with any oncological needs between now and when she gets established with an oncologist in [place]. We will also get an xray of her thoracic and lumbar spine today to assess for abnormality. She was also encouraged to continue taking calcium everyday. All questions were answered. The patient knows to call the clinic with any problems, questions or concerns. We can certainly see the patient much sooner if necessary.

## 2013-09-26 NOTE — Progress Notes (Signed)
Stacey Frees, MD [address] [place] [address]  Cancer of upper-outer quadrant of female breast  Shoulder pain, left  Anxiety and depression  Diagnosis: 43 year old with stage IIA ([id number]) invasive ductal carcinoma, triple negative, With ki-67 80% measuring 3.5 x 1.0 cm diagnosed March 2013  ROS: Psychological ROS: positive for - anxiety and depression ENT ROS: negative Endocrine ROS: negative Breast ROS: negative for breast lumps negative Respiratory ROS: no cough, shortness of breath, or wheezing Cardiovascular ROS: no chest pain or dyspnea on exertion negative Gastrointestinal ROS: no abdominal pain, change in bowel habits, or black or bloody stools negative Genito-Urinary ROS: no dysuria, trouble voiding, or hematuria negative Musculoskeletal ROS: positive for - joint pain and left shoulder pain Neurological ROS: no TIA or stroke symptoms negative  Prior Therapy: 1. Seen at [facility] with self palpated breast mass, needle core biopsy positive for IDC, triple negative, stage IIA  2. Genetic testing revealed BRCA I mutation  3. S/P port placement with complication of pneumothorax  4. S/P Neoadjuvant chemotherapy with Adriamycin/cytoxan given dose dense between 07/13/11 - 08/24/11  5. Neoadjuvant carbo/taxol begin 09/06/12 - 11/24/11 X 12 weeks completed  #6 s/p bilateral mastectomies on 12/21/2011. Pathology revealed no evidence of residual cancer in the left breast. Sentinel nodes were negative for metastatic disease. Right breast without any evidence of malignancy. Patient has undergone immediate reconstruction of the breasts bilaterally.  #7 patient is now status post bilateral salpingo-oophorectomy and hysterectomy.  CURRENT THERAPY: Observation  INTERVAL HISTORY: Stacey Cross 43 y.o. female returns for her regular 6 month follow up visit.  The patient states that overall she is doing well but that she has been very stressed as she and her family will be moving  to [place] in the next 2-3 months and that she is afraid of losing her insurance. She states that she has had some mild left shoulder pain that comes and goes and doesn't seem to have a specific trigger factor. She is denying any fevers, chills, night sweats, shortness of breath, chest pains or palpitations. She has had no nausea or vomiting. She states that she is very active and that she has gotten up to running 3 miles per day but still has the occasional bilateral hip pain. She does not have neuropathy or weakness in her legs, no changes in bowel or bladder habits.  Past Medical History  Diagnosis Date  . BRCA1 positive 08/17/2011  . Fatigue 09/28/2011  . Chemotherapy induced thrombocytopenia 10/12/2011  . Pneumothorax, postprocedural 06/2011    after [facility] cath   . Shortness of breath 06/2011    "related to the collapsed lung S/P PAC placement"  . History of blood transfusion ?10/2011    "during chemo treatments"  . Breast cancer   . Portacath in place     not flushed-will not be using for surgery per pt request  . GERD (gastroesophageal reflux disease)     has Cancer of upper-outer quadrant of female breast; Neutropenia associated with mucositis; BRCA1 positive; and Fatigue on her problem list.     is allergic to adhesive.  [name] does not currently have medications on file.  Past Surgical History  Procedure Laterality Date  . Cesarean section  2003  . Portacath placement  06/23/2011    Procedure: INSERTION PORT-A-CATH;  Surgeon: Haywood Lasso, MD;  Location: [facility];  Service: General;  Laterality: N/A;  carm   . Mastectomy complete / simple w/  sentinel node biopsy  12/21/2011    left  . Mastectomy, radical  12/21/2011    right  . Tissue expander placement  12/21/2011    bilateral breast area  . Dilation and curettage of uterus  2002    "as a result of miscarriage"  . Pyloric stenosis      "@ age 18 week"  . Breast biopsy  05/2011    left  . Tissue  expander placement  12/21/2011    Procedure: TISSUE EXPANDER;  Surgeon: Crissie Reese, MD;  Location: [facility];  Service: Plastics;  Laterality: Bilateral;  PLACEMENT OF BILATERAL TISSUE EXPANDERS   . Laparoscopic assisted vaginal hysterectomy  02/08/2012    Procedure: LAPAROSCOPIC ASSISTED VAGINAL HYSTERECTOMY;  Surgeon: Olga Millers, MD;  Location: [facility];  Service: Gynecology;  Laterality: N/A;   Patient's medical, surgical and familial history reviewed.   PHYSICAL EXAMINATION  ECOG PERFORMANCE STATUS: 0 - Asymptomatic  There were no vitals filed for this visit.  GENERAL:alert, healthy, well nourished, well developed, anxious, cooperative and crying SKIN: skin color, texture, turgor are normal, no rashes or significant lesions HEAD: Normocephalic, No masses, lesions, tenderness or abnormalities EYES: normal, PERRLA, Conjunctiva are pink and non-injected, sclera clear EARS: External ears normal OROPHARYNX:no exudate, no erythema, lips, buccal mucosa, and tongue normal, dentition normal and mucous membranes are moist  NECK: supple, no adenopathy, no bruits, no JVD, thyroid normal size, non-tender, without nodularity, no stridor, non-tender LYMPH:  no palpable lymphadenopathy, no hepatosplenomegaly BREAST:breasts appear normal, no suspicious masses, no skin or nipple changes or axillary nodes, right breast normal without mass, skin or nipple changes or axillary nodes, left breast normal without mass, skin or nipple changes or axillary nodes, bilateral implants, no palpable abnormalities otherwise LUNGS: clear to auscultation , and palpation, clear to auscultation and percussion HEART: regular rate & rhythm, no murmurs, no gallops, S1 normal and S2 normal ABDOMEN:abdomen soft, non-tender, normal bowel sounds, no masses or organomegaly, no bladder distention identified and no rebound or guarding BACK: Back symmetric, no curvature., No CVA tenderness, Range of motion is normal EXTREMITIES:less  then 2 second capillary refill, no joint deformities, effusion, or inflammation, no edema, no skin discoloration, no clubbing, no cyanosis  NEURO: alert & oriented x 3 with fluent speech, no focal motor/sensory deficits, gait normal, reflexes normal and symmetric  LABORATORY DATA: CBC    Component Value Date/Time   WBC 4.2 09/25/2013 1450   WBC 7.8 02/09/2012 0514   RBC 4.40 09/25/2013 1450   RBC 4.09 02/09/2012 0514   HGB 14.9 09/25/2013 1450   HGB 13.1 02/09/2012 0514   HCT 40.6 09/25/2013 1450   HCT 37.7 02/09/2012 0514   PLT 145 09/25/2013 1450   PLT 182 02/09/2012 0514   MCV 92.3 09/25/2013 1450   MCV 92.2 02/09/2012 0514   MCH 33.9 09/25/2013 1450   MCH 32.0 02/09/2012 0514   MCHC 36.7* 09/25/2013 1450   MCHC 34.7 02/09/2012 0514   RDW 11.8 09/25/2013 1450   RDW 12.1 02/09/2012 0514   LYMPHSABS 1.4 09/25/2013 1450   MONOABS 0.3 09/25/2013 1450   EOSABS 0.0 09/25/2013 1450   BASOSABS 0.0 09/25/2013 1450   CMP     Component Value Date/Time   NA 139 09/25/2013 1450   NA 141 11/24/2011 0850   K 3.9 09/25/2013 1450   K 3.6 11/24/2011 0850   CL 103 06/19/2012 0813   CL 105 11/24/2011 0850   CO2 27 09/25/2013 1450   CO2 26 11/24/2011 0850  GLUCOSE 87 09/25/2013 1450   GLUCOSE 96 06/19/2012 0813   GLUCOSE 82 11/24/2011 0850   BUN 19.0 09/25/2013 1450   BUN 14 11/24/2011 0850   CREATININE 0.7 09/25/2013 1450   CREATININE 0.54 11/24/2011 0850   CALCIUM 9.2 09/25/2013 1450   CALCIUM 9.0 11/24/2011 0850   PROT 7.5 09/25/2013 1450   PROT 5.6* 11/24/2011 0850   ALBUMIN 4.4 09/25/2013 1450   ALBUMIN 3.8 11/24/2011 0850   AST 19 09/25/2013 1450   AST 14 11/24/2011 0850   ALT 16 09/25/2013 1450   ALT 24 11/24/2011 0850   ALKPHOS 82 09/25/2013 1450   ALKPHOS 54 11/24/2011 0850   BILITOT 0.89 09/25/2013 1450   BILITOT 0.3 11/24/2011 0850   GFRNONAA >90 07/06/2011 0525   GFRAA >90 07/06/2011 0525    RADIOGRAPHIC STUDIES:  EXAM:  MRI PELVIS WITHOUT AND WITH CONTRAST  TECHNIQUE:  Multiplanar multisequence MR imaging of  the pelvis was performed  both before and after administration of intravenous contrast.  CONTRAST: 60m MULTIHANCE GADOBENATE DIMEGLUMINE 529 MG/ML IV SOLN  COMPARISON: DG LUMBAR SPINE 2-3V dated 09/16/2012; CT ABD/PELVIS W CM  dated 06/20/2012  FINDINGS:  There is no focal marrow signal abnormality. There is no acute  fracture, dislocation or avascular necrosis. There is no joint  effusion.  The SI joints are normal. The iliopsoas tendons are normal. The  proximal hamstring origins are normal. The muscles are normal in  signal. There is no focal fluid collection or hematoma. There is no  enhancing soft tissue mass. There are no of abnormal enhancement.  IMPRESSION:  Normal MRI of the pelvis.  EXAM:  DUAL X-RAY ABSORPTIOMETRY (DXA) FOR BONE MINERAL DENSITY  COMPARISON: None.  FINDINGS:  AP LUMBAR SPINE  Bone Mineral Density (BMD): 0.914 g/cm2  Young Adult T Score: -1.2  Z Score: -0.8  Left FEMUR neck  Bone Mineral Density (BMD): 0.693 g/cm2  Young Adult T Score: -1.4  Z Score: -1.0  ASSESSMENT: Patient's diagnostic category is low bone mass by WHO  Criteria.  FRACTURE RISK: Moderate  FRAX: Based on the [place] the 10 year  probability of a major osteoporotic fracture is 2.9%. The 10 year  probability of a hip fracture is 0.3%.  RECOMMENDATIONS:  Effective therapies are available in the form of bisphosphonates,  selective estrogen receptor modulators, biologic agents, and hormone  replacement therapy (for women). All patients should ensure an  adequate intake of dietary calcium (1200 mg daily) and vitamin D  (800 IU daily) unless contraindicated.  All treatment decisions require clinical judgement and consideration  of individual patient factors, including patient preferences,  co-morbidities, previous drug use, risk factors not captured in the  FRAX model (e.g., frailty, falls, vitamin D deficiency, increased  bone turnover, interval significant  decline in bone density) and  possible under-or over-estimation of fracture risk by FRAX.  The National Osteoporosis Foundation recommends that FDA-approved  medical therapies be considered in postmenopausal women and men age  4286or older with a:  1. Hip or vertebral (clinical or morphometric) fracture.  2. T-score of -2.5 or lower at the spine or hip.  3. Ten-year fracture probability by FRAX of 3% or greater for hip  fracture or 20% or greater for major osteoporotic fracture.  FOLLOW UP: People with diagnosed cases of osteoporosis or at high  risk for fracture should have regular bone mineral density tests.  For patients eligible for Medicare, routine testing is allowed once  every 2 years.  The testing frequency can be increased to one year  for patients who have rapidly progressing disease, those who are  receiving or discontinuing medical therapy to restore bone mass, or  have additional risk factors.  World Pharmacologist [facility]) Criteria:  Normal: T scores from +1.0 to -1.0  Low Bone Mass (Osteopenia): T scores between -1.0 and -2.5  Osteoporosis: T scores -2.5 and below  Comparison to Reference Population:  T score is the key measure used in the diagnosis of osteoporosis and  relative risk determination for fracture. It provides a value for  bone mass relative to the mean bone mass of a young adult reference  population expressed in terms of standard deviation (SD).  Z score is the age-matched score showing the patient's values  compared to a population matched for age, sex, and race. This is  also expressed in terms of standard deviation. The patient may have  values that compare favorably to the age-matched values and still be  at increased risk for fracture.  Dg Thoracic Spine 2 View  09/25/2013   CLINICAL DATA:  Personal history of breast cancer in 2013. Patient presents with upper back pain. No recent injuries.  EXAM: THORACIC SPINE - 2 VIEW  COMPARISON:  Bone window  images from CT chest 07/04/2011.  FINDINGS: Twelve rib-bearing thoracic vertebrae with anatomic alignment. No fractures. No significant thoracic spondylosis. No visible lytic or sclerotic metastases. Swimmer's view demonstrates minimal disc space narrowing at C6-7.  IMPRESSION: Normal examination of the thoracic spine. Specifically, no evidence of osseous metastatic disease.   Electronically Signed   By: Evangeline Dakin M.D.   On: 09/25/2013 17:20   Dg Lumbar Spine 2-3 Views  09/25/2013   CLINICAL DATA:  Low back pain  EXAM: LUMBAR SPINE - 2-3 VIEW  COMPARISON:  None.  FINDINGS: Vertebral body height is well maintained. No compression deformities are seen. Very minimal osteophytic changes are noted. No acute abnormality is noted.  IMPRESSION: No acute abnormality noted.   Electronically Signed   By: Inez Catalina M.D.   On: 09/25/2013 17:21   ASSESSMENT: Patient is very tearful today. She states that she is very stressed over her impending move to [place] and having to quit her job. She also c/o intermittent left shoulder pain that does not seem to have any trigger factor and some bilateral hip pain when running. She denies having any headaches, dizziness or blurred vision. She denies any nausea, vomiting, sore throat, cough or chest pain. She denies having any diarrhea, constipation or blood in her urine or stool. She denies and swelling in her extremities and no pain other than the afore mentioned in the left shoulder and hips.        THERAPY PLAN: Discussed patients labs and previous DEXA scan and MRI. The patient will start taking Lexapro for anxiety and depression and discussed side effects and treatment regimen. The patient also discussed with and seen by Dr. Jana Hakim. She was instructed to apply for insurance with her husbands new company as soon as possible and to find a PCP and breast oncologist once she moves. She was also instructed that her next DEXA scan would be due in February 2017. She was  instructed to call the clinic with any oncological needs between now and when she gets established with an oncologist in [place]. We will also get an xray of her thoracic and lumbar spine today to assess for abnormality. She was also encouraged to continue taking calcium everyday. All questions  were answered. The patient knows to call the clinic with any problems, questions or concerns. We can certainly see the patient much sooner if necessary.  The patient and plan discussed with Virgie Dad. Magrinat, MD and he is in agreement with the aforementioned.  I spent 30 minutes counseling the patient face to face. The total time spent in the appointment was 40 minutes.  [facility] M 09/26/2013

## 2013-10-29 IMAGING — CR DG LUMBAR SPINE 2-3V
3 series · 3 of 3 positions shown · non-contrast
Comparison: CT abdomen pelvis of 06/20/2012

CLINICAL DATA: Low back pain, runs marathon, history of breast
carcinoma

LUMBAR SPINE - 2-3 VIEW

[view not recorded (1 of 3)]
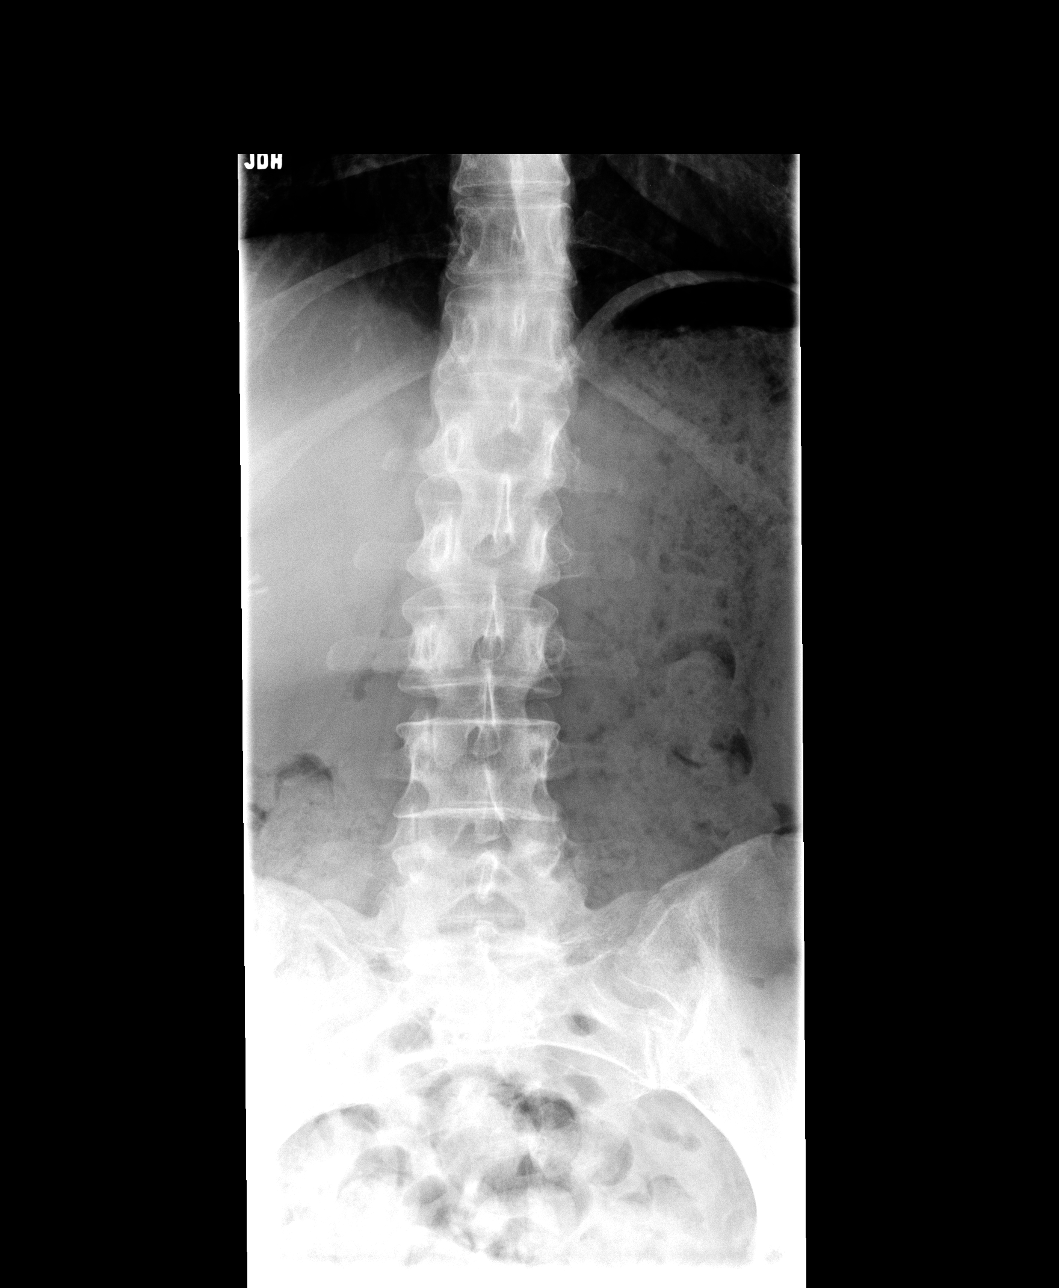

[view not recorded (2 of 3)]
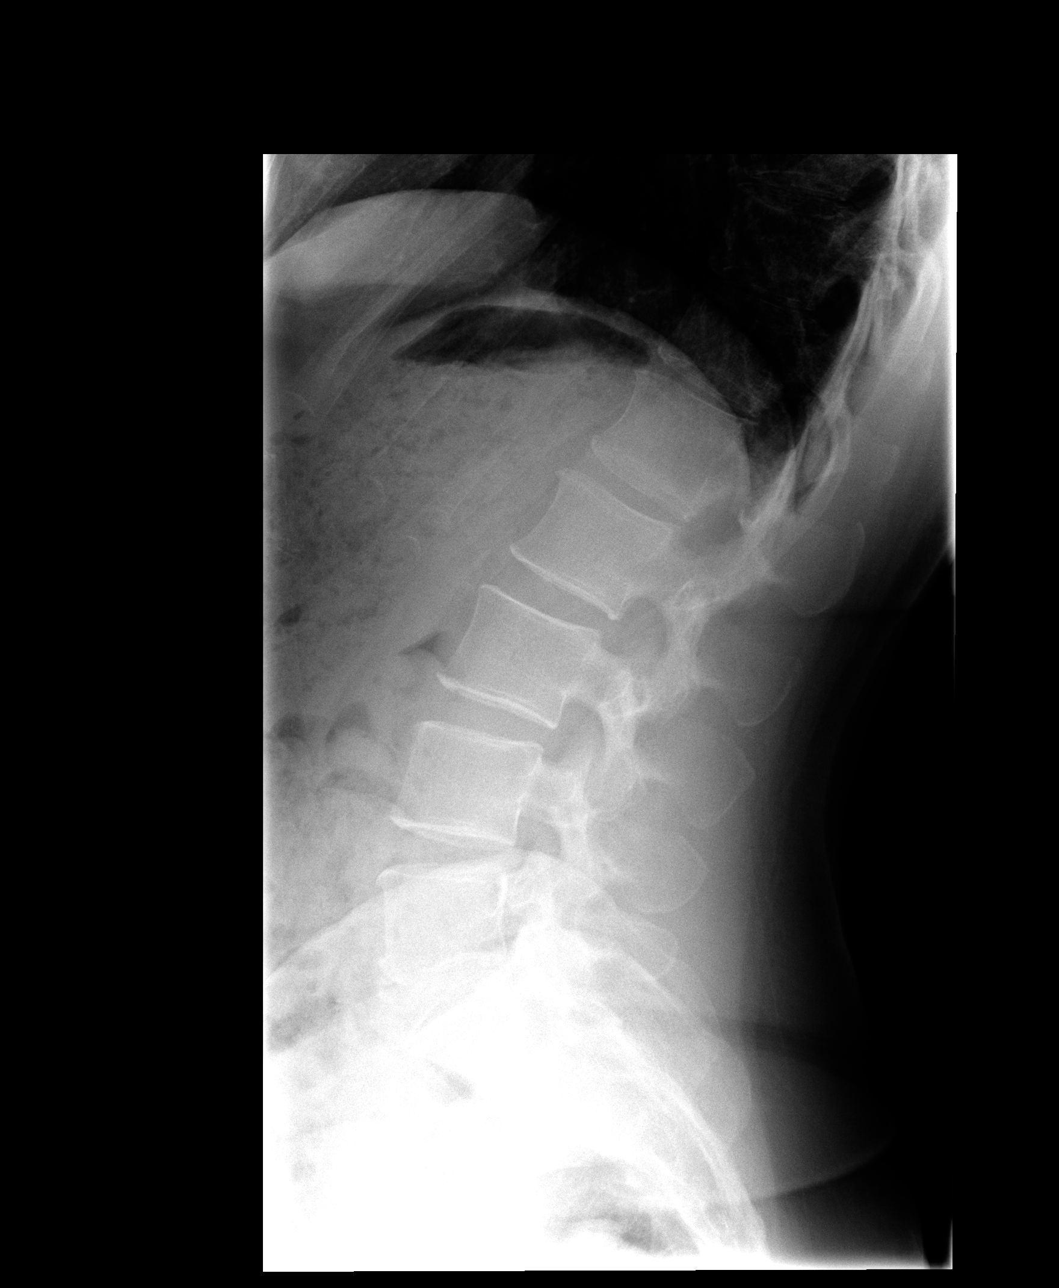

[view not recorded (3 of 3)]
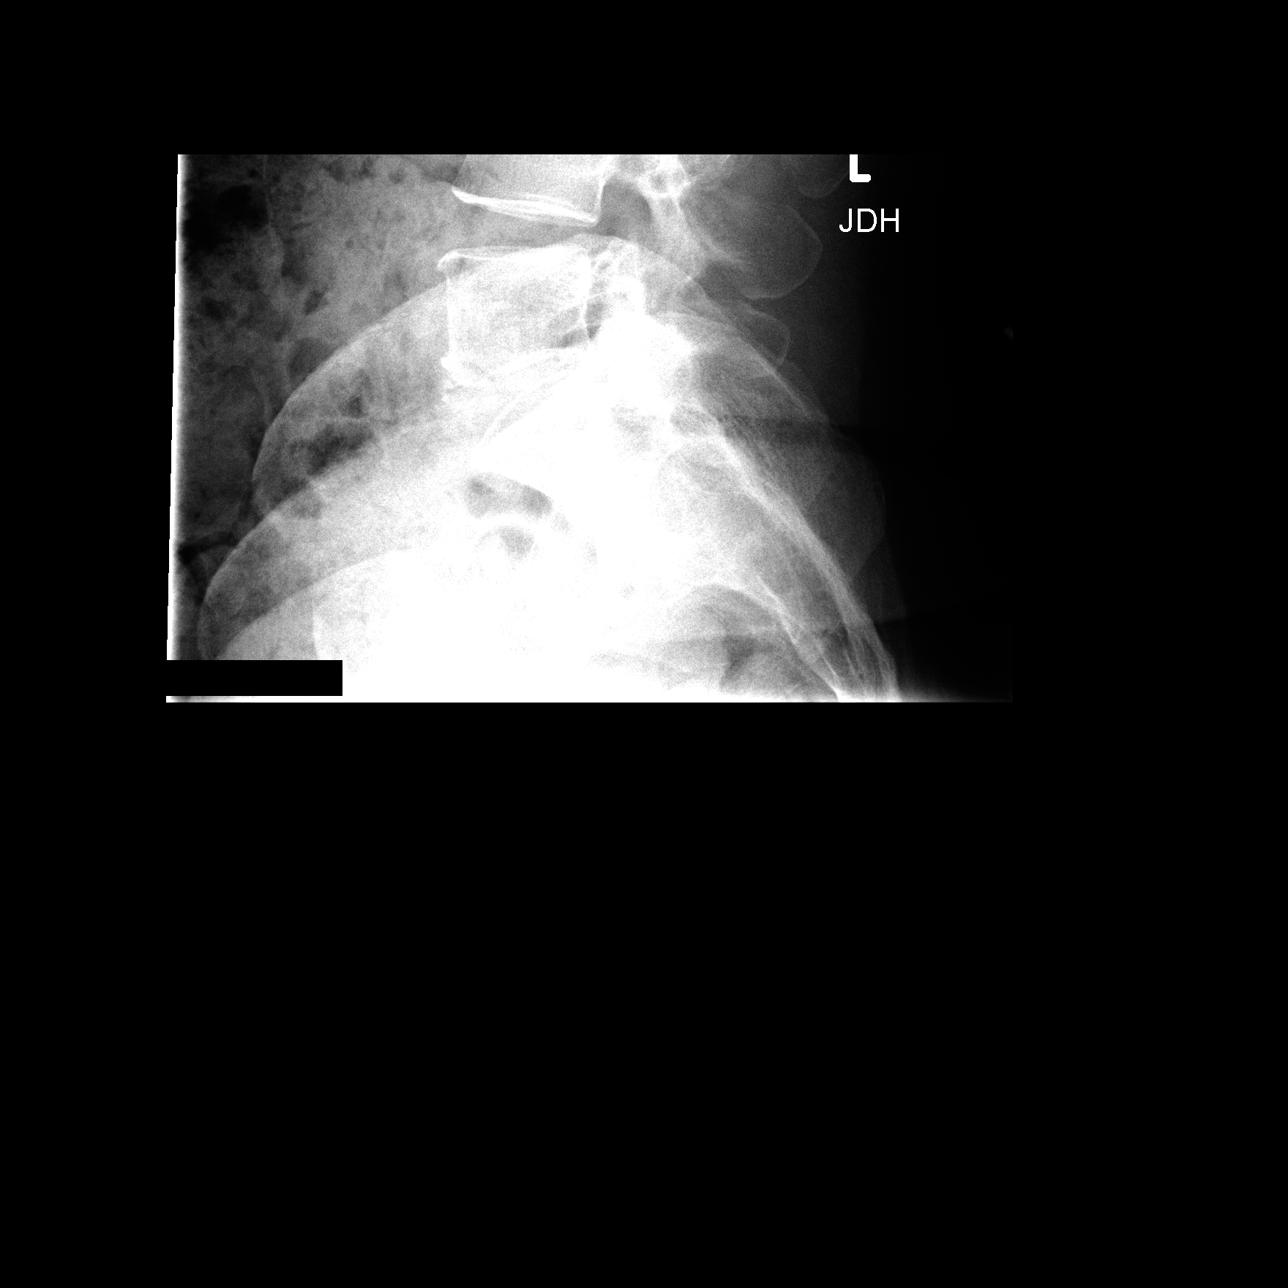

[3 of 3 positions shown; findings below may reference images not displayed]

FINDINGS: Lumbar vertebrae are in normal alignment. Intervertebral
disc spaces appear normal.  No compression deformity is seen.
IMPRESSION: Normal alignment.  Normal disc spaces.

## 2014-01-05 ENCOUNTER — Other Ambulatory Visit: Payer: Self-pay | Admitting: *Deleted

## 2014-01-05 DIAGNOSIS — C50419 Malignant neoplasm of upper-outer quadrant of unspecified female breast: Secondary | ICD-10-CM

## 2014-01-05 MED ORDER — ESCITALOPRAM OXALATE 10 MG PO TABS: 10.0000 mg | ORAL_TABLET | Freq: Every day | ORAL | Status: AC

## 2014-01-07 ENCOUNTER — Telehealth: Payer: Self-pay | Admitting: Oncology

## 2014-01-07 NOTE — Telephone Encounter (Signed)
Sent records to [facility].

## 2014-02-16 ENCOUNTER — Encounter: Payer: Self-pay | Admitting: Cardiology

## 2014-06-11 ENCOUNTER — Other Ambulatory Visit: Payer: Self-pay | Admitting: Nurse Practitioner

## 2014-12-11 NOTE — Progress Notes (Signed)
This encounter was created in error - please disregard.

## 2021-01-05 ENCOUNTER — Other Ambulatory Visit: Payer: Self-pay | Admitting: Radiation Oncology

## 2021-01-05 ENCOUNTER — Telehealth: Payer: Self-pay | Admitting: Radiation Oncology

## 2021-01-05 NOTE — Telephone Encounter (Signed)
I called the patient and then her husband, he answered and took my call.  I explained that I was working on a project following patients in the long-term through our registry department for triple negative breast disease.  He states his wife is doing very well and has graduated from oncology follow-up in 2018 she remains without disease.  I encouraged him to call if they had any questions or wanted to speak with [place] about what were doing in this project.  A link to my chart set up was sent to her phone.  He will pass along the message and let us know if they have any questions.
